# Patient Record
Sex: Male | Born: 1945 | Race: White | Hispanic: No | Marital: Married | Smoking: Former smoker
Health system: Western US, Academic
[De-identification: ages and names within clinical notes are randomized; demographics above are authoritative.]

## PROBLEM LIST (undated history)

## (undated) DIAGNOSIS — H269 Unspecified cataract: Secondary | ICD-10-CM

## (undated) DIAGNOSIS — H18231 Secondary corneal edema, right eye: Secondary | ICD-10-CM

## (undated) DIAGNOSIS — H18891 Other specified disorders of cornea, right eye: Secondary | ICD-10-CM

## (undated) DIAGNOSIS — D869 Sarcoidosis, unspecified: Secondary | ICD-10-CM

## (undated) DIAGNOSIS — H21541 Posterior synechiae (iris), right eye: Secondary | ICD-10-CM

## (undated) DIAGNOSIS — E1065 Type 1 diabetes mellitus with hyperglycemia: Secondary | ICD-10-CM

## (undated) DIAGNOSIS — E1069 Type 1 diabetes mellitus with other specified complication: Secondary | ICD-10-CM

## (undated) DIAGNOSIS — E7800 Pure hypercholesterolemia, unspecified: Secondary | ICD-10-CM

## (undated) DIAGNOSIS — I2510 Atherosclerotic heart disease of native coronary artery without angina pectoris: Secondary | ICD-10-CM

## (undated) DIAGNOSIS — I10 Essential (primary) hypertension: Secondary | ICD-10-CM

## (undated) DIAGNOSIS — H5200 Hypermetropia, unspecified eye: Secondary | ICD-10-CM

## (undated) DIAGNOSIS — G3183 Dementia with Lewy bodies: Secondary | ICD-10-CM

## (undated) DIAGNOSIS — I519 Heart disease, unspecified: Secondary | ICD-10-CM

## (undated) DIAGNOSIS — F0280 Dementia in other diseases classified elsewhere without behavioral disturbance: Secondary | ICD-10-CM

## (undated) DIAGNOSIS — N200 Calculus of kidney: Secondary | ICD-10-CM

## (undated) DIAGNOSIS — E113599 Type 2 diabetes mellitus with proliferative diabetic retinopathy without macular edema, unspecified eye: Secondary | ICD-10-CM

## (undated) DIAGNOSIS — H409 Unspecified glaucoma: Secondary | ICD-10-CM

## (undated) DIAGNOSIS — H348192 Central retinal vein occlusion, unspecified eye, stable: Secondary | ICD-10-CM

## (undated) DIAGNOSIS — Z961 Presence of intraocular lens: Secondary | ICD-10-CM

## (undated) DIAGNOSIS — H1811 Bullous keratopathy, right eye: Secondary | ICD-10-CM

## (undated) DIAGNOSIS — I5020 Unspecified systolic (congestive) heart failure: Secondary | ICD-10-CM

## (undated) DIAGNOSIS — I219 Acute myocardial infarction, unspecified: Secondary | ICD-10-CM

## (undated) DIAGNOSIS — J45909 Unspecified asthma, uncomplicated: Secondary | ICD-10-CM

## (undated) DIAGNOSIS — E119 Type 2 diabetes mellitus without complications: Secondary | ICD-10-CM

## (undated) DIAGNOSIS — E785 Hyperlipidemia, unspecified: Secondary | ICD-10-CM

## (undated) HISTORY — PX: CORONARY STENT INTERVENTION: CATH118234

## (undated) HISTORY — DX: Posterior synechiae (iris), right eye: H21.541

## (undated) HISTORY — DX: Pure hypercholesterolemia, unspecified: E78.00

## (undated) HISTORY — DX: Heart disease, unspecified: I51.9

## (undated) HISTORY — DX: Type 2 diabetes mellitus with proliferative diabetic retinopathy without macular edema, unspecified eye (CMS-HCC): E11.3599

## (undated) HISTORY — DX: Secondary corneal edema, right eye: H18.231

## (undated) HISTORY — DX: Unspecified glaucoma: H40.9

## (undated) HISTORY — DX: Bullous keratopathy, right eye: H18.11

## (undated) HISTORY — DX: Sarcoidosis, unspecified: D86.9

## (undated) HISTORY — DX: Essential (primary) hypertension: I10

## (undated) HISTORY — DX: Hypermetropia, unspecified eye: H52.00

## (undated) HISTORY — PX: CORONARY ARTERY BYPASS GRAFT: SHX141

## (undated) HISTORY — DX: Central retinal vein occlusion, unspecified eye, stable (CMS-HCC): H34.8192

## (undated) HISTORY — PX: SPLENECTOMY, TOTAL: SHX788

## (undated) HISTORY — DX: Calculus of kidney: N20.0

## (undated) HISTORY — PX: KIDNEY STONE SURGERY: SHX686

## (undated) HISTORY — DX: Presence of intraocular lens: Z96.1

## (undated) HISTORY — DX: Atherosclerotic heart disease of native coronary artery without angina pectoris: I25.10

## (undated) HISTORY — DX: Unspecified cataract: H26.9

## (undated) HISTORY — DX: Type 1 diabetes mellitus with hyperglycemia (CMS-HCC): E10.65

## (undated) HISTORY — DX: Other specified disorders of cornea, right eye: H18.891

## (undated) MED ORDER — KETOROLAC TROMETHAMINE 0.5 % OP SOLN
1.00 [drp] | OPHTHALMIC | Status: AC
Start: 2019-03-11 — End: 2019-03-11

## (undated) MED ORDER — TETRACAINE HCL 0.5 % OP SOLN
1.00 [drp] | OPHTHALMIC | Status: AC
Start: 2019-03-11 — End: 2019-03-11

## (undated) MED ORDER — PREDNISOLONE ACETATE 1 % OP SUSP
1.00 [drp] | OPHTHALMIC | Status: AC
Start: 2019-03-11 — End: 2019-03-11

---
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
## 2013-07-07 HISTORY — PX: GLAUCOMA SURGERY: SHX656

## 2014-03-23 ENCOUNTER — Ambulatory Visit: Payer: Self-pay | Admitting: Ophthalmology

## 2014-04-21 ENCOUNTER — Observation Stay: Admission: TF | Admit: 2014-04-21 | Payer: Self-pay | Admitting: Ophthalmology

## 2014-04-21 HISTORY — PX: CATARACT EXTRACTION: SHX1313B

## 2014-04-22 ENCOUNTER — Observation Stay: Admission: TF | Admit: 2014-04-22 | Payer: Self-pay | Admitting: Ophthalmology

## 2014-04-22 ENCOUNTER — Ambulatory Visit: Payer: Self-pay | Admitting: Ophthalmology

## 2014-04-22 HISTORY — PX: PARS PLANA VITRECTOMY: SHX2166

## 2014-04-23 ENCOUNTER — Ambulatory Visit: Payer: Self-pay

## 2014-04-27 ENCOUNTER — Ambulatory Visit: Payer: Self-pay

## 2014-05-04 ENCOUNTER — Ambulatory Visit: Payer: Self-pay | Admitting: Ophthalmology

## 2014-05-12 ENCOUNTER — Observation Stay: Admission: TF | Admit: 2014-05-12 | Payer: Self-pay | Admitting: Ophthalmology

## 2014-05-12 HISTORY — PX: INTRAOCULAR LENS INSERTION: SHX110

## 2014-05-13 ENCOUNTER — Ambulatory Visit: Payer: Self-pay | Admitting: Ophthalmology

## 2014-05-25 ENCOUNTER — Emergency Department (HOSPITAL_BASED_OUTPATIENT_CLINIC_OR_DEPARTMENT_OTHER): Payer: PRIVATE HEALTH INSURANCE

## 2014-05-25 ENCOUNTER — Encounter (HOSPITAL_BASED_OUTPATIENT_CLINIC_OR_DEPARTMENT_OTHER): Payer: Self-pay | Admitting: Emergency Medicine

## 2014-05-25 ENCOUNTER — Emergency Department (HOSPITAL_BASED_OUTPATIENT_CLINIC_OR_DEPARTMENT_OTHER)
Admission: EM | Admit: 2014-05-25 | Discharge: 2014-05-25 | Disposition: A | Discharge status: Home / Self Care | Payer: PRIVATE HEALTH INSURANCE | Attending: Emergency Medicine | Admitting: Emergency Medicine

## 2014-05-25 DIAGNOSIS — J45901 Unspecified asthma with (acute) exacerbation: Secondary | ICD-10-CM | POA: Insufficient documentation

## 2014-05-25 DIAGNOSIS — R0602 Shortness of breath: Secondary | ICD-10-CM | POA: Diagnosis present

## 2014-05-25 HISTORY — DX: Unspecified asthma, uncomplicated: J45.909

## 2014-05-25 MED ORDER — ALBUTEROL (5 MG/ML) CONTINUOUS INHALATION SOLN
15.0000 mg/h | INHALATION_SOLUTION | RESPIRATORY_TRACT | Status: DC
  Administered 2014-05-25: 15 mg/h via RESPIRATORY_TRACT
  Filled 2014-05-25: qty 20

## 2014-05-25 MED ORDER — AZITHROMYCIN 250 MG PO TABS: 250.0000 mg | ORAL_TABLET | Freq: Every day | ORAL | Status: DC

## 2014-05-25 MED ORDER — PREDNISONE 50 MG PO TABS
60.0000 mg | ORAL_TABLET | Freq: Every day | ORAL | Status: DC
  Administered 2014-05-25: 60 mg via ORAL
  Filled 2014-05-25 (×4): qty 1

## 2014-05-25 MED ORDER — IPRATROPIUM-ALBUTEROL 0.5-2.5 (3) MG/3ML IN SOLN
3.0000 mL | Freq: Once | RESPIRATORY_TRACT | Status: AC
  Administered 2014-05-25: 3 mL via RESPIRATORY_TRACT

## 2014-05-25 MED ORDER — PREDNISONE 10 MG PO TABS: 20.0000 mg | ORAL_TABLET | Freq: Every day | ORAL | Status: DC

## 2014-05-25 MED ORDER — PREDNISONE 10 MG PO TABS: ORAL_TABLET | ORAL | Status: DC

## 2014-05-25 MED ORDER — ALBUTEROL SULFATE (2.5 MG/3ML) 0.083% IN NEBU
INHALATION_SOLUTION | RESPIRATORY_TRACT | Status: AC
  Administered 2014-05-25: 2.5 mg via RESPIRATORY_TRACT
  Filled 2014-05-25: qty 3

## 2014-05-25 MED ORDER — AZITHROMYCIN 250 MG PO TABS
500.0000 mg | ORAL_TABLET | Freq: Once | ORAL | Status: AC
  Administered 2014-05-25: 500 mg via ORAL
  Filled 2014-05-25: qty 2

## 2014-05-25 MED ORDER — ALBUTEROL SULFATE (2.5 MG/3ML) 0.083% IN NEBU
2.5000 mg | INHALATION_SOLUTION | Freq: Once | RESPIRATORY_TRACT | Status: AC
  Administered 2014-05-25: 2.5 mg via RESPIRATORY_TRACT

## 2014-05-25 MED ORDER — ALBUTEROL SULFATE (2.5 MG/3ML) 0.083% IN NEBU
5.0000 mg | INHALATION_SOLUTION | Freq: Once | RESPIRATORY_TRACT | Status: AC
Start: 2014-05-25 — End: 2014-05-25
  Administered 2014-05-25: 5 mg via RESPIRATORY_TRACT
  Filled 2014-05-25: qty 6

## 2014-05-25 MED ORDER — IPRATROPIUM-ALBUTEROL 0.5-2.5 (3) MG/3ML IN SOLN
RESPIRATORY_TRACT | Status: AC
  Administered 2014-05-25: 3 mL via RESPIRATORY_TRACT
  Filled 2014-05-25: qty 3

## 2014-05-25 NOTE — ED Provider Notes (Signed)
CSN: [id number]     Arrival date & time 05/25/14  1536 History   First MD Initiated Contact with Patient 05/25/14 1657     Chief Complaint  Patient presents with  . Shortness of Breath     (Consider location/radiation/quality/duration/timing/severity/associated sxs/prior Treatment) Patient is a 68 y.o. male presenting with shortness of breath. The history is provided by the patient. No language interpreter was used.  Shortness of Breath Severity:  Moderate Onset quality:  Gradual Duration:  1 day Timing:  Constant Progression:  Worsening Chronicity:  New Relieved by:  Nothing Worsened by:  Nothing tried Ineffective treatments:  None tried Associated symptoms: cough   Risk factors: no hx of cancer     Past Medical History  Diagnosis Date  . Asthma    History reviewed. No pertinent past surgical history. History reviewed. No pertinent family history. History  Substance Use Topics  . Smoking status: Former Games developermoker  . Smokeless tobacco: Not on file  . Alcohol Use: No    Review of Systems  Respiratory: Positive for cough and shortness of breath.   All other systems reviewed and are negative.     Allergies  Review of patient's allergies indicates no known allergies.  Home Medications   Prior to Admission medications   Not on File   BP 129/89  Pulse 94  Temp(Src) 98.3 F (36.8 C) (Oral)  Resp 20  SpO2 100% Physical Exam  Nursing note and vitals reviewed. Constitutional: He is oriented to person, place, and time. He appears well-developed and well-nourished.  HENT:  Head: Normocephalic and atraumatic.  Right Ear: External ear normal.  Left Ear: External ear normal.  Eyes: Conjunctivae and EOM are normal. Pupils are equal, round, and reactive to light.  Neck: Normal range of motion.  Cardiovascular: Normal rate and normal heart sounds.   Pulmonary/Chest: He has wheezes. He exhibits tenderness.  Abdominal: Soft. He exhibits no distension.  Musculoskeletal:  Normal range of motion.  Neurological: He is alert and oriented to person, place, and time.  Skin: Skin is warm.  Psychiatric: He has a normal mood and affect.    ED Course  Procedures (including critical care time) Labs Review Labs Reviewed - No data to display  Imaging Review Dg Chest 2 View  05/25/2014   CLINICAL DATA:  Acute asthma exacerbation with shortness of breath.  EXAM: CHEST  2 VIEW  COMPARISON:  05/04/2014, 08/04/2007.  FINDINGS: Cardiomediastinal silhouette unremarkable, unchanged. Lungs clear. Bronchovascular markings normal. Pulmonary vascularity normal. No visible pleural effusions. No pneumothorax. Visualized bony thorax intact. No significant interval change.  IMPRESSION: Normal and stable examination.   Electronically Signed   By: Hulan Saashomas  Lawrence M.D.   On: 05/25/2014 16:24     EKG Interpretation None      MDM  Pt given albuterol neb.  Pt feels better.   Pt given 1 hour continous neb.  Reexam.   Pt's lungs are clear.    Final diagnoses:  Asthma attack    Prednisone taper Zithromax Pt has albuterol inhaler See Primary for recheck in 2-3 days    Elson AreasLeslie K Sofia, PA-C 05/25/14 2210

## 2014-05-25 NOTE — Discharge Instructions (Signed)
Asthma °Asthma is a recurring condition in which the airways tighten and narrow. Asthma can make it difficult to breathe. It can cause coughing, wheezing, and shortness of breath. Asthma episodes, also called asthma attacks, range from minor to life-threatening. Asthma cannot be cured, but medicines and lifestyle changes can help control it. °CAUSES °Asthma is believed to be caused by inherited (genetic) and environmental factors, but its exact cause is unknown. Asthma may be triggered by allergens, lung infections, or irritants in the air. Asthma triggers are different for each person. Common triggers include:  °· Animal dander. °· Dust mites. °· Cockroaches. °· Pollen from trees or grass. °· Mold. °· Smoke. °· Air pollutants such as dust, household cleaners, hair sprays, aerosol sprays, paint fumes, strong chemicals, or strong odors. °· Cold air, weather changes, and winds (which increase molds and pollens in the air). °· Strong emotional expressions such as crying or laughing hard. °· Stress. °· Certain medicines (such as aspirin) or types of drugs (such as beta-blockers). °· Sulfites in foods and drinks. Foods and drinks that may contain sulfites include dried fruit, potato chips, and sparkling grape juice. °· Infections or inflammatory conditions such as the flu, a cold, or an inflammation of the nasal membranes (rhinitis). °· Gastroesophageal reflux disease (GERD). °· Exercise or strenuous activity. °SYMPTOMS °Symptoms may occur immediately after asthma is triggered or many hours later. Symptoms include: °· Wheezing. °· Excessive nighttime or early morning coughing. °· Frequent or severe coughing with a common cold. °· Chest tightness. °· Shortness of breath. °DIAGNOSIS  °The diagnosis of asthma is made by a review of your medical history and a physical exam. Tests may also be performed. These may include: °· Lung function studies. These tests show how much air you breathe in and out. °· Allergy  tests. °· Imaging tests such as X-rays. °TREATMENT  °Asthma cannot be cured, but it can usually be controlled. Treatment involves identifying and avoiding your asthma triggers. It also involves medicines. There are 2 classes of medicine used for asthma treatment:  °· Controller medicines. These prevent asthma symptoms from occurring. They are usually taken every day. °· Reliever or rescue medicines. These quickly relieve asthma symptoms. They are used as needed and provide short-term relief. °Your health care provider will help you create an asthma action plan. An asthma action plan is a written plan for managing and treating your asthma attacks. It includes a list of your asthma triggers and how they may be avoided. It also includes information on when medicines should be taken and when their dosage should be changed. An action plan may also involve the use of a device called a peak flow meter. A peak flow meter measures how well the lungs are working. It helps you monitor your condition. °HOME CARE INSTRUCTIONS  °· Take medicines only as directed by your health care provider. Speak with your health care provider if you have questions about how or when to take the medicines. °· Use a peak flow meter as directed by your health care provider. Record and keep track of readings. °· Understand and use the action plan to help minimize or stop an asthma attack without needing to seek medical care. °· Control your home environment in the following ways to help prevent asthma attacks: °¨ Do not smoke. Avoid being exposed to secondhand smoke. °¨ Change your heating and air conditioning filter regularly. °¨ Limit your use of fireplaces and wood stoves. °¨ Get rid of pests (such as roaches and   mice) and their droppings. °¨ Throw away plants if you see mold on them. °¨ Clean your floors and dust regularly. Use unscented cleaning products. °¨ Try to have someone else vacuum for you regularly. Stay out of rooms while they are  being vacuumed and for a short while afterward. If you vacuum, use a dust mask from a hardware store, a double-layered or microfilter vacuum cleaner bag, or a vacuum cleaner with a HEPA filter. °¨ Replace carpet with wood, tile, or vinyl flooring. Carpet can trap dander and dust. °¨ Use allergy-proof pillows, mattress covers, and box spring covers. °¨ Wash bed sheets and blankets every week in hot water and dry them in a dryer. °¨ Use blankets that are made of polyester or cotton. °¨ Clean bathrooms and kitchens with bleach. If possible, have someone repaint the walls in these rooms with mold-resistant paint. Keep out of the rooms that are being cleaned and painted. °¨ Wash hands frequently. °SEEK MEDICAL CARE IF:  °· You have wheezing, shortness of breath, or a cough even if taking medicine to prevent attacks. °· The colored mucus you cough up (sputum) is thicker than usual. °· Your sputum changes from clear or white to yellow, green, gray, or bloody. °· You have any problems that may be related to the medicines you are taking (such as a rash, itching, swelling, or trouble breathing). °· You are using a reliever medicine more than 2-3 times per week. °· Your peak flow is still at 50-79% of your personal best after following your action plan for 1 hour. °· You have a fever. °SEEK IMMEDIATE MEDICAL CARE IF:  °· You seem to be getting worse and are unresponsive to treatment during an asthma attack. °· You are short of breath even at rest. °· You get short of breath when doing very little physical activity. °· You have difficulty eating, drinking, or talking due to asthma symptoms. °· You develop chest pain. °· You develop a fast heartbeat. °· You have a bluish color to your lips or fingernails. °· You are light-headed, dizzy, or faint. °· Your peak flow is less than 50% of your personal best. °MAKE SURE YOU:  °· Understand these instructions. °· Will watch your condition. °· Will get help right away if you are not  doing well or get worse. °Document Released: 08/06/2005 Document Revised: 12/21/2013 Document Reviewed: 03/05/2013 °Exit[employer]® Patient Information ©2015 [employer]. This information is not intended to replace advice given to you by your health care provider. Make sure you discuss any questions you have with your health care provider. °Acute Bronchitis °Bronchitis is inflammation of the airways that extend from the windpipe into the lungs (bronchi). The inflammation often causes mucus to develop. This leads to a cough, which is the most common symptom of bronchitis.  °In acute bronchitis, the condition usually develops suddenly and goes away over time, usually in a couple weeks. Smoking, allergies, and asthma can make bronchitis worse. Repeated episodes of bronchitis may cause further lung problems.  °CAUSES °Acute bronchitis is most often caused by the same virus that causes a cold. The virus can spread from person to person (contagious) through coughing, sneezing, and touching contaminated objects. °SIGNS AND SYMPTOMS  °· Cough.   °· Fever.   °· Coughing up mucus.   °· Body aches.   °· Chest congestion.   °· Chills.   °· Shortness of breath.   °· Sore throat.   °DIAGNOSIS  °Acute bronchitis is usually diagnosed through a physical exam. Your health care provider will also   ask you questions about your medical history. Tests, such as chest X-rays, are sometimes done to rule out other conditions.  °TREATMENT  °Acute bronchitis usually goes away in a couple weeks. Oftentimes, no medical treatment is necessary. Medicines are sometimes given for relief of fever or cough. Antibiotic medicines are usually not needed but may be prescribed in certain situations. In some cases, an inhaler may be recommended to help reduce shortness of breath and control the cough. A cool mist vaporizer may also be used to help thin bronchial secretions and make it easier to clear the chest.  °HOME CARE INSTRUCTIONS °· Get plenty of rest.    °· Drink enough fluids to keep your urine clear or pale yellow (unless you have a medical condition that requires fluid restriction). Increasing fluids may help thin your respiratory secretions (sputum) and reduce chest congestion, and it will prevent dehydration.   °· Take medicines only as directed by your health care provider. °· If you were prescribed an antibiotic medicine, finish it all even if you start to feel better. °· Avoid smoking and secondhand smoke. Exposure to cigarette smoke or irritating chemicals will make bronchitis worse. If you are a smoker, consider using nicotine gum or skin patches to help control withdrawal symptoms. Quitting smoking will help your lungs heal faster.   °· Reduce the chances of another bout of acute bronchitis by washing your hands frequently, avoiding people with cold symptoms, and trying not to touch your hands to your mouth, nose, or eyes.   °· Keep all follow-up visits as directed by your health care provider.   °SEEK MEDICAL CARE IF: °Your symptoms do not improve after 1 week of treatment.  °SEEK IMMEDIATE MEDICAL CARE IF: °· You develop an increased fever or chills.   °· You have chest pain.   °· You have severe shortness of breath. °· You have bloody sputum.   °· You develop dehydration. °· You faint or repeatedly feel like you are going to pass out. °· You develop repeated vomiting. °· You develop a severe headache. °MAKE SURE YOU:  °· Understand these instructions. °· Will watch your condition. °· Will get help right away if you are not doing well or get worse. °Document Released: 09/13/2004 Document Revised: 12/21/2013 Document Reviewed: 01/27/2013 °ExitCare® Patient Information ©2015 [employer]. This information is not intended to replace advice given to you by your health care provider. Make sure you discuss any questions you have with your health care provider. ° °

## 2014-05-25 NOTE — ED Notes (Signed)
Pt c/o SOB x 1 day HX asthma 

## 2014-05-26 NOTE — ED Provider Notes (Signed)
Medical screening examination/treatment/procedure(s) were performed by non-physician practitioner and as supervising physician I was immediately available for consultation/collaboration.  Toy CookeyMegan Tiamarie Furnari, MD 05/26/14 [phone]

## 2014-05-27 ENCOUNTER — Ambulatory Visit: Payer: Self-pay

## 2014-06-17 ENCOUNTER — Ambulatory Visit: Payer: Self-pay | Admitting: Ophthalmology

## 2014-06-17 ENCOUNTER — Ambulatory Visit: Payer: Self-pay

## 2014-09-14 ENCOUNTER — Ambulatory Visit: Payer: Self-pay | Admitting: Ophthalmology

## 2015-03-15 ENCOUNTER — Ambulatory Visit: Payer: Self-pay | Admitting: Ophthalmology

## 2016-03-13 ENCOUNTER — Ambulatory Visit: Payer: Self-pay | Admitting: Ophthalmology

## 2016-06-19 DIAGNOSIS — Z961 Presence of intraocular lens: Secondary | ICD-10-CM | POA: Insufficient documentation

## 2016-10-16 ENCOUNTER — Other Ambulatory Visit: Payer: Self-pay

## 2016-10-16 DIAGNOSIS — H348112 Central retinal vein occlusion, right eye, stable: Secondary | ICD-10-CM | POA: Insufficient documentation

## 2016-10-16 DIAGNOSIS — H401190 Primary open-angle glaucoma, unspecified eye, stage unspecified: Secondary | ICD-10-CM | POA: Insufficient documentation

## 2016-10-16 DIAGNOSIS — H18231 Secondary corneal edema, right eye: Secondary | ICD-10-CM | POA: Insufficient documentation

## 2016-10-16 DIAGNOSIS — H04123 Dry eye syndrome of bilateral lacrimal glands: Secondary | ICD-10-CM | POA: Insufficient documentation

## 2016-10-16 DIAGNOSIS — H2512 Age-related nuclear cataract, left eye: Secondary | ICD-10-CM | POA: Insufficient documentation

## 2016-10-16 DIAGNOSIS — E113599 Type 2 diabetes mellitus with proliferative diabetic retinopathy without macular edema, unspecified eye: Secondary | ICD-10-CM | POA: Insufficient documentation

## 2016-10-16 MED ORDER — CARVEDILOL 12.5 MG OR TABS: 12.50 mg | ORAL_TABLET | Freq: Two times a day (BID) | ORAL | Status: AC

## 2016-10-16 MED ORDER — DORZOLAMIDE HCL 2 % OP SOLN: OPHTHALMIC | Status: AC

## 2016-10-16 MED ORDER — INSULIN LISPRO (HUMAN) 100 UNIT/ML SC SOLN: SUBCUTANEOUS | Status: AC

## 2016-10-16 MED ORDER — TRAVOPROST 0.004 % OP SOLN: 1.00 [drp] | Freq: Every evening | OPHTHALMIC | Status: AC

## 2016-10-16 MED ORDER — SIMVASTATIN 40 MG OR TABS: 40.00 mg | ORAL_TABLET | Freq: Every evening | ORAL | Status: AC

## 2016-10-16 MED ORDER — ASPIRIN 325 MG OR TABS
ORAL_TABLET | ORAL | Status: DC
Start: ? — End: 2018-12-30

## 2016-10-16 MED ORDER — RAMIPRIL 5 MG OR CAPS
5.00 mg | ORAL_CAPSULE | Freq: Every day | ORAL | Status: DC
Start: ? — End: 2019-04-01

## 2016-10-16 MED ORDER — BRIMONIDINE TARTRATE-TIMOLOL 0.2-0.5 % OP SOLN: 1.00 [drp] | Freq: Three times a day (TID) | OPHTHALMIC | Status: AC

## 2016-10-18 ENCOUNTER — Ambulatory Visit: Payer: Medicare Other | Admitting: Ophthalmology

## 2016-10-18 ENCOUNTER — Encounter: Payer: Self-pay | Admitting: Ophthalmology

## 2016-10-18 DIAGNOSIS — H02883 Meibomian gland dysfunction of right eye, unspecified eyelid: Secondary | ICD-10-CM

## 2016-10-18 DIAGNOSIS — H04123 Dry eye syndrome of bilateral lacrimal glands: Secondary | ICD-10-CM

## 2016-10-18 DIAGNOSIS — H2512 Age-related nuclear cataract, left eye: Secondary | ICD-10-CM

## 2016-10-18 DIAGNOSIS — H0289 Other specified disorders of eyelid: Secondary | ICD-10-CM

## 2016-10-18 DIAGNOSIS — H18231 Secondary corneal edema, right eye: Secondary | ICD-10-CM

## 2016-10-18 DIAGNOSIS — Z961 Presence of intraocular lens: Secondary | ICD-10-CM

## 2016-10-18 DIAGNOSIS — H18891 Other specified disorders of cornea, right eye: Secondary | ICD-10-CM

## 2016-10-18 MED ORDER — ASPIRIN 81 MG OR TABS: 81.00 mg | ORAL_TABLET | Freq: Every day | ORAL | Status: AC

## 2016-10-18 MED ORDER — SODIUM CHLORIDE (HYPERTONIC) 5 % OP SOLN: 1.00 [drp] | Freq: Three times a day (TID) | OPHTHALMIC | Status: AC

## 2016-10-18 MED ORDER — SODIUM CHLORIDE (HYPERTONIC) 5 % OP OINT: 1.00 [drp] | TOPICAL_OINTMENT | Freq: Every evening | OPHTHALMIC | Status: AC

## 2016-10-18 NOTE — Progress Notes (Signed)
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 If painful bullae or epi breakdown, would consider surgical intervention.     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 D/w patient my concerns of active NV and recommend he keep appt w/ retina.     Glaucoma   h/o 2 tubes   *continue Combigan, Travatan, Dorzolamide   followed by Dr. Ronita HippsLee     Allison R Jarstad DO   Cornea and Refractive Surgery Fellow         I performed the above HPI. I reviewed and confirmed the techs ROS, past histories, and readings.  I saw and examined the patient, and discussed the case with the resident/fellow.  The final examination findings, image interpretations, and plan as documented in the record represent my personal judgment and conclusions.

## 2016-10-29 ENCOUNTER — Telehealth: Payer: Self-pay | Admitting: Ophthalmology

## 2016-10-29 NOTE — Telephone Encounter (Signed)
John at Dr. Marigene EhlersLee's office requesting the last progress reports for the patient's last visit on 10/18/2016. Please fax to [phone], thank you.

## 2016-10-30 NOTE — Telephone Encounter (Signed)
Office Visit Notes from 10/18/16 faxed to Dr. Nedra HaiLee.

## 2017-03-14 ENCOUNTER — Ambulatory Visit: Payer: Medicare Other | Attending: Ophthalmology | Admitting: Ophthalmology

## 2017-03-14 ENCOUNTER — Ambulatory Visit: Payer: Self-pay | Admitting: Ophthalmology

## 2017-03-14 DIAGNOSIS — H348112 Central retinal vein occlusion, right eye, stable: Secondary | ICD-10-CM | POA: Insufficient documentation

## 2017-03-14 DIAGNOSIS — H401130 Primary open-angle glaucoma, bilateral, stage unspecified: Secondary | ICD-10-CM | POA: Insufficient documentation

## 2017-03-14 DIAGNOSIS — Z961 Presence of intraocular lens: Secondary | ICD-10-CM | POA: Insufficient documentation

## 2017-03-14 DIAGNOSIS — H04123 Dry eye syndrome of bilateral lacrimal glands: Secondary | ICD-10-CM | POA: Insufficient documentation

## 2017-03-14 DIAGNOSIS — H2512 Age-related nuclear cataract, left eye: Principal | ICD-10-CM | POA: Insufficient documentation

## 2017-03-14 MED ORDER — TAMSULOSIN HCL 0.4 MG PO CAPS: 0.40 mg | ORAL_CAPSULE | Freq: Every day | ORAL | Status: AC

## 2017-03-14 NOTE — Progress Notes (Signed)
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 D/w patient my concerns of active NV and recommend he keep appt w/ retina.     Glaucoma   h/o 2 tubes ; T 21 today; recommend repeat IOP check within 1-2 months with glaucoma service; pt to call Dr. Nedra HaiLee to see if appt can be moved up  *continue Combigan, Travatan, Dorzolamide   followed by Dr. Delma OfficerLee     Mina Farahani, MD  Cornea and Refractive Surgery Fellow     I performed the above HPI. I reviewed and confirmed the techs ROS, past histories, and readings.  I saw and examined the patient, and discussed the case with the resident/fellow.  The final examination findings, image interpretations, and plan as documented in the record represent my personal judgment and conclusions.

## 2017-07-23 ENCOUNTER — Emergency Department (HOSPITAL_BASED_OUTPATIENT_CLINIC_OR_DEPARTMENT_OTHER): Payer: Medicare Other

## 2017-07-23 ENCOUNTER — Encounter (HOSPITAL_BASED_OUTPATIENT_CLINIC_OR_DEPARTMENT_OTHER): Payer: Self-pay | Admitting: Emergency Medicine

## 2017-07-23 ENCOUNTER — Other Ambulatory Visit: Payer: Self-pay

## 2017-07-23 ENCOUNTER — Emergency Department (HOSPITAL_BASED_OUTPATIENT_CLINIC_OR_DEPARTMENT_OTHER)
Admission: EM | Admit: 2017-07-23 | Discharge: 2017-07-23 | Disposition: A | Discharge status: Home / Self Care | Payer: Medicare Other | Attending: Emergency Medicine | Admitting: Emergency Medicine

## 2017-07-23 DIAGNOSIS — Y939 Activity, unspecified: Secondary | ICD-10-CM | POA: Diagnosis not present

## 2017-07-23 DIAGNOSIS — Y999 Unspecified external cause status: Secondary | ICD-10-CM | POA: Insufficient documentation

## 2017-07-23 DIAGNOSIS — Z23 Encounter for immunization: Secondary | ICD-10-CM | POA: Insufficient documentation

## 2017-07-23 DIAGNOSIS — S6992XA Unspecified injury of left wrist, hand and finger(s), initial encounter: Secondary | ICD-10-CM

## 2017-07-23 DIAGNOSIS — Z87891 Personal history of nicotine dependence: Secondary | ICD-10-CM | POA: Insufficient documentation

## 2017-07-23 DIAGNOSIS — Y929 Unspecified place or not applicable: Secondary | ICD-10-CM | POA: Insufficient documentation

## 2017-07-23 DIAGNOSIS — W260XXA Contact with knife, initial encounter: Secondary | ICD-10-CM | POA: Insufficient documentation

## 2017-07-23 DIAGNOSIS — S61315A Laceration without foreign body of left ring finger with damage to nail, initial encounter: Secondary | ICD-10-CM | POA: Diagnosis present

## 2017-07-23 LAB — GENERIC EXTERNAL RESULT (UNMAPPED): Hemoglobin A1c: 9.2 % — ABNORMAL HIGH (ref 4–5.9)

## 2017-07-23 MED ORDER — TETANUS-DIPHTH-ACELL PERTUSSIS 5-2.5-18.5 LF-MCG/0.5 IM SUSP
0.5000 mL | Freq: Once | INTRAMUSCULAR | Status: AC
  Administered 2017-07-23: 0.5 mL via INTRAMUSCULAR
  Filled 2017-07-23: qty 0.5

## 2017-07-23 NOTE — Discharge Instructions (Signed)
Keep area clean and dry. You can wash with soap and water tomorrow. Do not scrub. Do not pick at clotted area - let it fall off on its own.  Return if worsening

## 2017-07-23 NOTE — ED Provider Notes (Signed)
[facility] EMERGENCY DEPARTMENT Provider Note   CSN: [id number] Arrival date & time: 07/23/17  2033     History   Chief Complaint Chief Complaint  Patient presents with  . Finger Injury    HPI Cody Small is a 71 y.o.right hand dominant male who presents with a left index finger injury. No significant PMH. He states that he was working with a circular knife when he accidentally cut the tip of his index finger and nail. He was unable to control the bleeding so came to the ED. He is not on blood thinners. Tetanus is not up to date. He is able to move his fingers. No numbness, tingling, weakness.  HPI  Past Medical History:  Diagnosis Date  . Asthma     There are no active problems to display for this patient.   History reviewed. No pertinent surgical history.     Home Medications    Prior to Admission medications   Medication Sig Start Date End Date Taking? Authorizing Provider  azithromycin (ZITHROMAX) 250 MG tablet Take 1 tablet (250 mg total) by mouth daily. Take first 2 tablets together, then 1 every day until finished. 05/25/14   Elson AreasSofia, Cody K, PA-C  predniSONE (DELTASONE) 10 MG tablet 5,4,3,2,1 taper 05/25/14   Elson AreasSofia, Cody K, PA-C    Family History History reviewed. No pertinent family history.  Social History Social History   Tobacco Use  . Smoking status: Former Games developermoker  . Smokeless tobacco: Never Used  Substance Use Topics  . Alcohol use: No  . Drug use: Not on file     Allergies   Patient has no known allergies.   Review of Systems Review of Systems  Musculoskeletal: Negative for arthralgias.  Skin: Positive for wound.  Neurological: Negative for weakness and numbness.     Physical Exam Updated Vital Signs BP (!) 159/68 (BP Location: Right Arm)   Pulse 65   Temp 98.5 F (36.9 C) (Oral)   Resp 18   Ht 5\' 7"  (1.702 m)   Wt 84.8 kg (187 lb)   SpO2 100%   BMI 29.29 kg/m   Physical Exam  Constitutional: He is oriented  to person, place, and time. He appears well-developed and well-nourished. No distress.  HENT:  Head: Normocephalic and atraumatic.  Eyes: Conjunctivae are normal. Pupils are equal, round, and reactive to light. Right eye exhibits no discharge. Left eye exhibits no discharge. No scleral icterus.  Neck: Normal range of motion.  Cardiovascular: Normal rate.  Pulmonary/Chest: Effort normal. No respiratory distress.  Abdominal: He exhibits no distension.  Musculoskeletal:  Skin avulsion over the tip of the left index finger which is actively bleeding  Neurological: He is alert and oriented to person, place, and time.  Skin: Skin is warm and dry.  Psychiatric: He has a normal mood and affect. His behavior is normal.  Nursing note and vitals reviewed.   ED Treatments / Results  Labs (all labs ordered are listed, but only abnormal results are displayed) Labs Reviewed - No data to display  EKG  EKG Interpretation None       Radiology Dg Hand Complete Left  Result Date: 07/23/2017 CLINICAL DATA:  71 year old male with laceration of the index finger. EXAM: LEFT HAND - COMPLETE 3+ VIEW COMPARISON:  None. FINDINGS: There is no acute fracture or dislocation. The bones are mildly osteopenic. There is laceration of the skin of the tip of the index finger. No radiopaque foreign object or soft tissue gas. IMPRESSION:  No acute/ traumatic osseous pathology. Electronically Signed   By: Arash  Radparvar M.D.   On: 07/23/2017 22:10    Procedures PElgie Collardrocedures (including critical care time)  Medications Ordered in ED Medications  Tdap (BOOSTRIX) injection 0.5 mL (0.5 mLs Intramuscular Given 07/23/17 2304)     Initial Impression / Assessment and Plan / ED Course  I have reviewed the triage vital signs and the nursing notes.  Pertinent labs & imaging results that were available during my care of the patient were reviewed by me and considered in my medical decision making (see chart for  details).  71 year old male presents with skin avulsion of the tip of the left index finger. Wound is not amenable to suturing.  Finger was copiously irrigated under tap water. Casandra DoffingWoundseal was applied and bleeding was controlled. Tdap was updated. Patient was observed for 20 minutes after dressing was applied and there was no further bleeding. He was given wound care instructions and discharged. Return precautions given.  Final Clinical Impressions(s) / ED Diagnoses   Final diagnoses:  Injury of finger of left hand, initial encounter    ED Discharge Orders    None       Bethel BornGekas, Brax Walen Marie, PA-C 07/24/17 Mike [id number]    Arby BarrettePfeiffer, Marcy, MD 07/25/17 [id number]

## 2017-07-23 NOTE — ED Notes (Signed)
Pt verbalizes understanding of d/c instructions and denies any further needs at this time. 

## 2017-07-23 NOTE — ED Triage Notes (Signed)
Patient states that he cut his left 1st finger with a circular knife The patient has bleeding controlled. Avulsion noted to the tip of his left 1st finger

## 2017-09-19 ENCOUNTER — Ambulatory Visit: Payer: Medicare Other | Attending: Ophthalmology | Admitting: Ophthalmology

## 2017-09-19 ENCOUNTER — Encounter: Payer: Self-pay | Admitting: Ophthalmology

## 2017-09-19 DIAGNOSIS — H18891 Other specified disorders of cornea, right eye: Secondary | ICD-10-CM | POA: Insufficient documentation

## 2017-09-19 DIAGNOSIS — H2512 Age-related nuclear cataract, left eye: Secondary | ICD-10-CM | POA: Insufficient documentation

## 2017-09-19 DIAGNOSIS — H04123 Dry eye syndrome of bilateral lacrimal glands: Principal | ICD-10-CM | POA: Insufficient documentation

## 2017-09-19 DIAGNOSIS — H18231 Secondary corneal edema, right eye: Secondary | ICD-10-CM | POA: Insufficient documentation

## 2017-09-19 NOTE — Progress Notes (Signed)
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 Cyndie ChimeNguyen)     Glaucoma   h/o 2 tubes ; T 26 today; recommend repeat IOP check within 1-2 months with glaucoma service; pt to call Dr. Nedra HaiLee to see if appt can be moved up  Had tube removal a few months ago- IOP elevated today  *continue Combigan, Travatan, Dorzolamide   followed by Dr. Nedra HaiLee     RTC 6 months or sooner PRN    Incident to Additional Comments by Signing Physician  I performed the above HPI. I reviewed and confirmed the techs review of systems, past histories, and readings.

## 2018-03-18 ENCOUNTER — Ambulatory Visit: Payer: Medicare Other | Admitting: Ophthalmology

## 2018-06-02 IMAGING — DX DG HAND COMPLETE 3+V*L*
3 series · 3 of 3 positions shown · non-contrast
Comparison: None.

CLINICAL DATA: 71-year-old male with laceration of the index
finger.

EXAM:
LEFT HAND - COMPLETE 3+ VIEW

[hand pa]
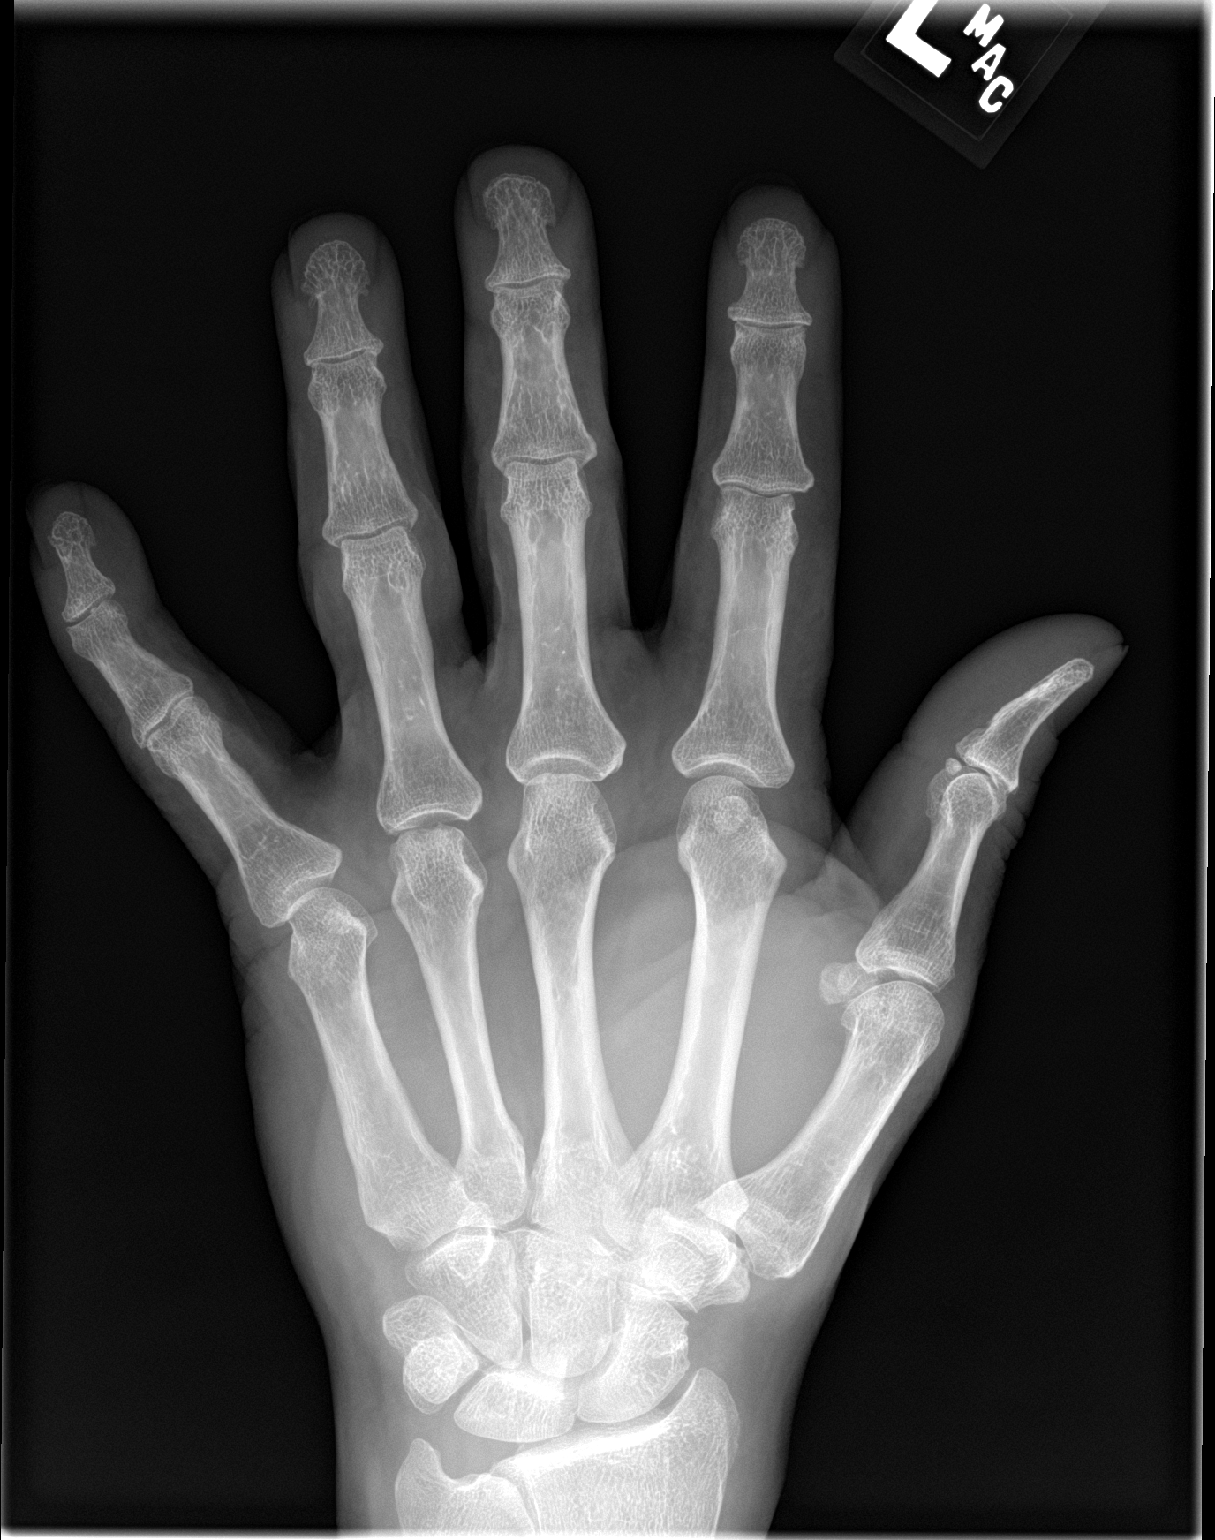

[hand obl]
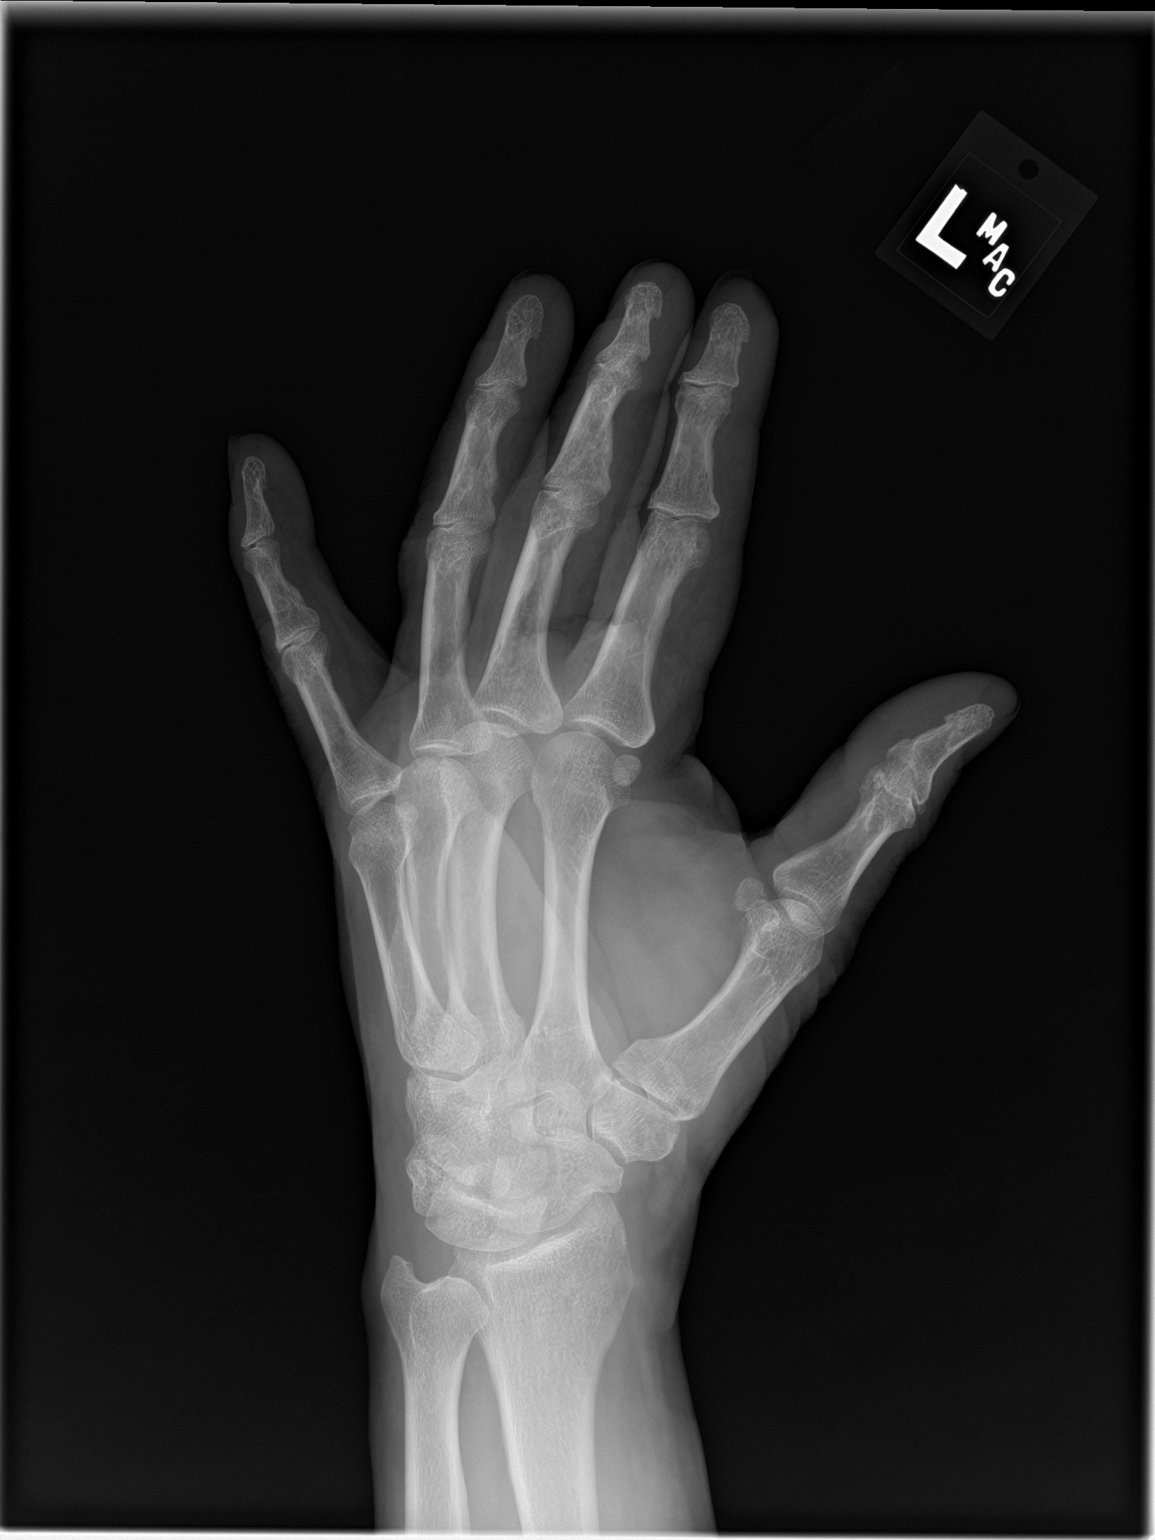

[hand lat]
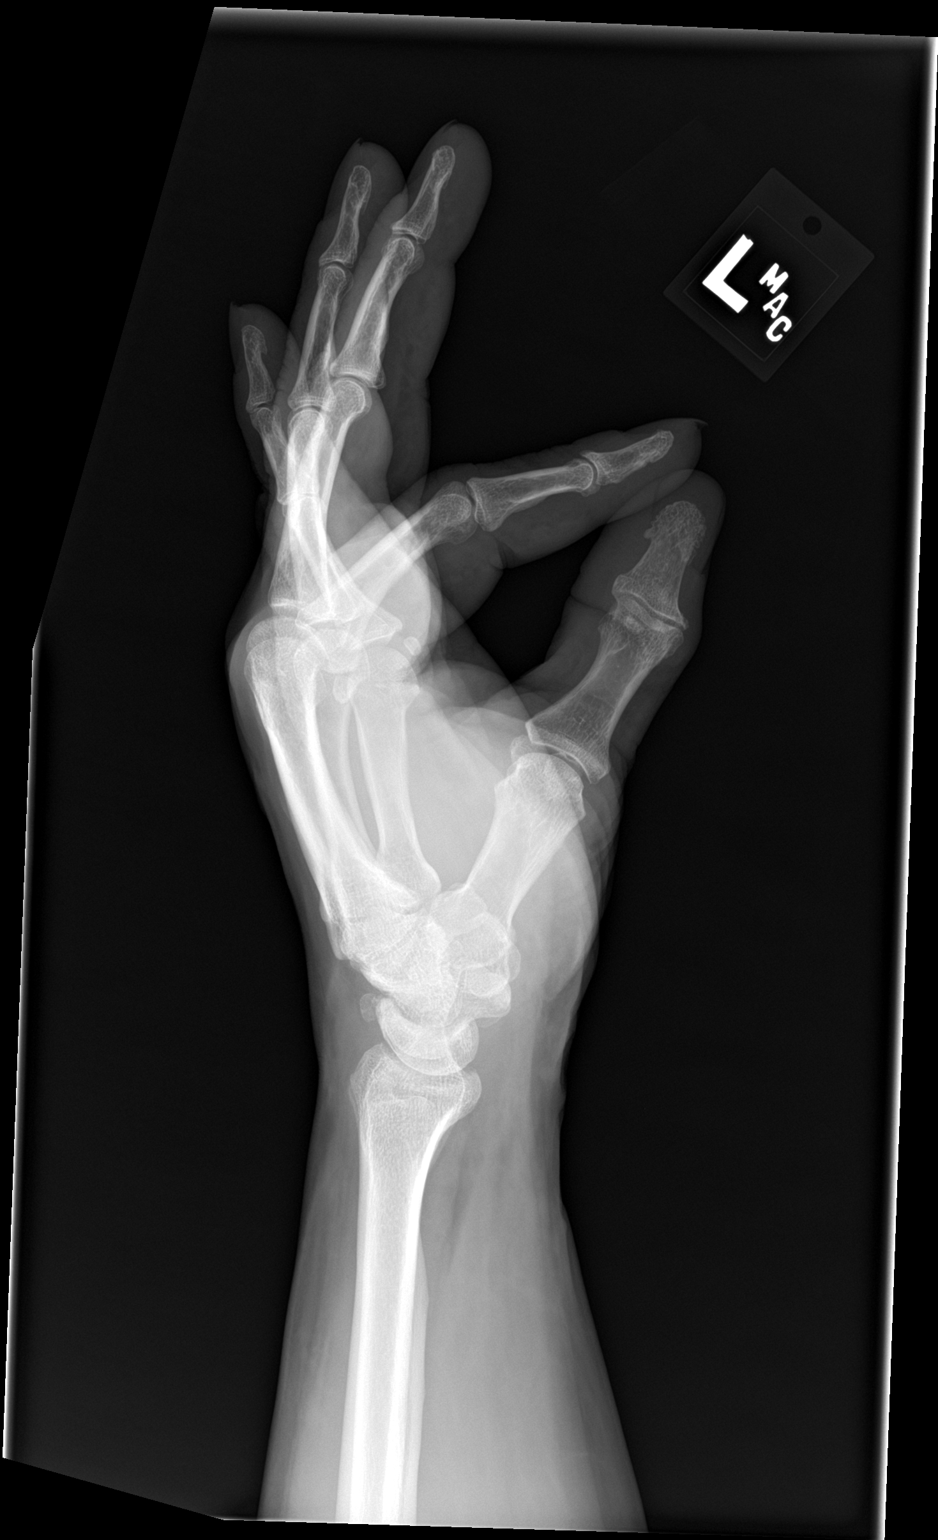

[3 of 3 positions shown; findings below may reference images not displayed]

FINDINGS: There is no acute fracture or dislocation. The bones are mildly
osteopenic. There is laceration of the skin of the tip of the index
finger. No radiopaque foreign object or soft tissue gas.
IMPRESSION: No acute/ traumatic osseous pathology.

## 2018-09-22 ENCOUNTER — Telehealth: Payer: Self-pay | Admitting: Ophthalmology

## 2018-09-22 NOTE — Telephone Encounter (Addendum)
Jessica from Dr.Lee's office called to try and schedule a sooner apt for pt. Informed her that pt did not have any apts pending at the moment. Booked pt for first available at [facility] location for 10/23/18. Please call pt back to accommodate sooner at phn#[phone] ok to leave message.  Thank you.

## 2018-09-30 ENCOUNTER — Ambulatory Visit: Payer: Medicare Other | Admitting: Ophthalmology

## 2018-10-02 ENCOUNTER — Ambulatory Visit: Payer: Medicare Other | Attending: Ophthalmology | Admitting: Ophthalmology

## 2018-10-02 ENCOUNTER — Encounter: Payer: Self-pay | Admitting: Ophthalmology

## 2018-10-02 DIAGNOSIS — H2512 Age-related nuclear cataract, left eye: Secondary | ICD-10-CM | POA: Insufficient documentation

## 2018-10-02 DIAGNOSIS — H1811 Bullous keratopathy, right eye: Principal | ICD-10-CM | POA: Insufficient documentation

## 2018-10-02 NOTE — Progress Notes (Signed)
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   Observe for now    h/o RVO OD with [facility]   poor visual potential from h/o RVO (f/b Dr. Cyndie Chime)     Glaucoma   h/o 2 tubes ; T 26 today; recommend repeat IOP check within 1-2 months with glaucoma service; pt to call Dr. Nedra Hai to see if appt can be moved up  Had tube removal a few months ago- IOP elevated today  *continue Combigan, Travatan, Dorzolamide   followed by Dr. Nedra Hai         I performed the above HPI. I reviewed and confirmed the techs review of systems, past histories, and readings.

## 2018-10-23 ENCOUNTER — Ambulatory Visit: Payer: Medicare Other | Admitting: Ophthalmology

## 2018-12-30 ENCOUNTER — Ambulatory Visit: Payer: Medicare Other | Attending: Ophthalmology | Admitting: Ophthalmology

## 2018-12-30 ENCOUNTER — Encounter: Payer: Self-pay | Admitting: Ophthalmology

## 2018-12-30 DIAGNOSIS — H04123 Dry eye syndrome of bilateral lacrimal glands: Secondary | ICD-10-CM | POA: Insufficient documentation

## 2018-12-30 DIAGNOSIS — H1811 Bullous keratopathy, right eye: Secondary | ICD-10-CM

## 2018-12-30 NOTE — Progress Notes (Signed)
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   Observe for now    h/o RVO OD with [facility]   poor visual potential from h/o RVO (f/b Dr. Cyndie Chime)     Glaucoma   h/o 2 tubes ; T 33 today  Had tube removal a few months ago- IOP elevated today  *continue Combigan, Travatan, Dorzolamide   followed by Dr. Nedra Hai - recommend referral to Dr Nedra Hai within the next week    Follow up in 3 mo        Tacy Dura, MD  Cornea and Refractive Surgery Fellow    I reviewed and confirmed all history components, including the HPI; and any other elements performed by the technician. I performed the key and critical portions of the exam. The final examination findings, image interpretations, and plan as documented in the record represent my personal judgment and conclusions.

## 2019-01-05 LAB — GENERIC EXTERNAL RESULT (UNMAPPED): Hemoglobin A1c: 8.7 % — ABNORMAL HIGH (ref 4–5.9)

## 2019-03-06 ENCOUNTER — Telehealth: Payer: Self-pay | Admitting: Ophthalmology

## 2019-03-06 NOTE — Telephone Encounter (Signed)
CLM @ [phone]  We can schedule appt with Dr. Tonye Royalty Monday in [place] or Tuesday in [place].

## 2019-03-06 NOTE — Telephone Encounter (Signed)
PLEASE TRIAGE

## 2019-03-06 NOTE — Telephone Encounter (Signed)
Spoke to pt wife, deferred Monday appt in [place].   appt 03/10/2019 at [facility]

## 2019-03-06 NOTE — Telephone Encounter (Signed)
Patient's wife calling in requesting an urgent appointment for patient due to him experiencing pressure and also states that the blister opened up and Dr. Truman Hayward advised patient that he needs to see Dr. Truman Hayward ASAP and the glaucoma specialist as well. I called urgent iphone no answer left voice mail.

## 2019-03-10 ENCOUNTER — Ambulatory Visit: Payer: Medicare Other | Attending: Ophthalmology | Admitting: Ophthalmology

## 2019-03-10 ENCOUNTER — Encounter: Payer: Self-pay | Admitting: Ophthalmology

## 2019-03-10 DIAGNOSIS — H04123 Dry eye syndrome of bilateral lacrimal glands: Secondary | ICD-10-CM | POA: Insufficient documentation

## 2019-03-10 DIAGNOSIS — H18231 Secondary corneal edema, right eye: Secondary | ICD-10-CM | POA: Insufficient documentation

## 2019-03-10 DIAGNOSIS — H1811 Bullous keratopathy, right eye: Secondary | ICD-10-CM | POA: Insufficient documentation

## 2019-03-10 DIAGNOSIS — H18891 Other specified disorders of cornea, right eye: Secondary | ICD-10-CM | POA: Insufficient documentation

## 2019-03-10 MED ORDER — MOXIFLOXACIN HCL 0.5 % OP SOLN
1.0000 [drp] | Freq: Four times a day (QID) | OPHTHALMIC | 0 refills | Status: AC
Start: 2019-03-10 — End: ?

## 2019-03-10 NOTE — Progress Notes (Signed)
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  Persistent and worsening corneal edema - with bullae  given poor visual potential - would defer on further surgery    Had burst bullae recently and eye has been painful, no ED on exam today but very rough surface  Add vig QID until next visit      * consider chronic BCTL (pt worried that he will forget to use daily abx gtts)  S/p botox tars 09/2018 - surface is stable--> found helpful but effect was temporary, would like to consider more permanent solution  Will plan to repeat botox tarrs at next visit--> submit for auth today     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   Observe for now    h/o RVO OD with [facility]   poor visual potential from h/o RVO (f/b Dr. Alfonse Spruce)     Glaucoma   h/o 2 tubes ; T 33 today  Had tube removal a few months ago- IOP elevated today  *continue Combigan, Travatan, Dorzolamide   followed by Dr. Truman Hayward   Dr. Truman Hayward would like patient to consider cyclophotocoagulation with [facility] glaucoma service, will have patient see glaucoma service next available, reassess cornea after treatment and consider further intervention      rtc 1 week for botox tars       I reviewed and confirmed all history components, including the HPI; and any other elements performed by the technician.  Incident to Additional Comments by Signing Physician

## 2019-03-11 ENCOUNTER — Encounter: Payer: Self-pay | Admitting: Glaucoma Specialist

## 2019-03-11 ENCOUNTER — Ambulatory Visit: Payer: Medicare Other | Attending: Glaucoma Specialist | Admitting: Glaucoma Specialist

## 2019-03-11 DIAGNOSIS — H2512 Age-related nuclear cataract, left eye: Secondary | ICD-10-CM | POA: Insufficient documentation

## 2019-03-11 DIAGNOSIS — H4051X3 Glaucoma secondary to other eye disorders, right eye, severe stage: Secondary | ICD-10-CM | POA: Insufficient documentation

## 2019-03-11 DIAGNOSIS — H348112 Central retinal vein occlusion, right eye, stable: Secondary | ICD-10-CM | POA: Insufficient documentation

## 2019-03-11 DIAGNOSIS — Z961 Presence of intraocular lens: Secondary | ICD-10-CM

## 2019-03-11 DIAGNOSIS — H1811 Bullous keratopathy, right eye: Secondary | ICD-10-CM

## 2019-03-11 DIAGNOSIS — H401130 Primary open-angle glaucoma, bilateral, stage unspecified: Secondary | ICD-10-CM

## 2019-03-11 DIAGNOSIS — H04123 Dry eye syndrome of bilateral lacrimal glands: Secondary | ICD-10-CM | POA: Insufficient documentation

## 2019-03-11 DIAGNOSIS — H18231 Secondary corneal edema, right eye: Secondary | ICD-10-CM | POA: Insufficient documentation

## 2019-03-11 NOTE — Progress Notes (Signed)
Mixed-mechanism glaucoma; POAG + NVG/CRVO  - Follows with Dr. Sabino Niemann; referred for Diode  - s/p GDD x 2, one removed a few months ago  - Tm 30s  - on MMT 4 classes   - IOP remains above goal   - Low vision potential given RVO and HM/LP vision   - Agree with diode OD  - CPM     Recommend glaucoma surgery dCPC OD, avoid SN tube   Lengthy discussion with patient regarding risks, benefits, and alternatives of surgery. The risks include but not limited to loss of eye, failure of the surgery to control pressure, the need for additional surgeries, bleeing, and infection. Patient verbalizes and understands that glaucoma surgery WILL NOT improve vision, and best case scenario it will preserve the vision the patient has now, but not infrequently the vision may actually worsen after glaucoma surgery, but in most cases this can be corrected with changes in glasses prescription. In addition, patient knows that pressure can be too high, too low, or normal after the surgery depends on how the well the tube works, and if too high or too low, the patient can quickly lose the remaining vision; patient understands these issues and elect to proceed with the surgery.     LSCD w/ secondary K-edema OD  - Follows w/ Farid  - Low vision potential, defers further surgery   - CPM w/ Vig, no epi defect today   - will be getting repeat botox of tars    CRVO OD  - stable  - Follow with Dr. Alfonse Spruce    PCIOL OD  NSC OS  - observe     Route chart to Dr. Truman Hayward    RTC POD1    I reviewed and confirmed all history components, including the HPI; and any other elements performed by the technician. I performed the key and critical portions of the exam. The final examination findings, image interpretations, and plan as documented in the record represent my personal judgment and conclusions.

## 2019-03-12 ENCOUNTER — Encounter: Payer: Self-pay | Admitting: Glaucoma Specialist

## 2019-03-12 DIAGNOSIS — H4050X0 Glaucoma secondary to other eye disorders, unspecified eye, stage unspecified: Secondary | ICD-10-CM

## 2019-03-16 ENCOUNTER — Telehealth: Payer: Self-pay | Admitting: Glaucoma Specialist

## 2019-03-16 NOTE — Telephone Encounter (Signed)
Dr. Alford Highland office was connected to the office for pre op form

## 2019-03-17 ENCOUNTER — Other Ambulatory Visit: Payer: Self-pay

## 2019-03-17 ENCOUNTER — Ambulatory Visit: Payer: Medicare Other | Admitting: Nurse Practitioner

## 2019-03-17 MED ORDER — ANTERIOR BLUE EYE KIT (WAM) (~~LOC~~)
PACK | 0 refills | Status: DC
Start: 2019-03-17 — End: 2019-03-24
  Filled 2019-03-17: qty 1, 1d supply, fill #0

## 2019-03-17 MED ORDER — MOXIFLOXACIN HCL 0.5 % OP SOLN
1.0000 [drp] | Freq: Four times a day (QID) | OPHTHALMIC | 0 refills | Status: DC
Start: 2019-03-17 — End: 2019-03-24
  Filled 2019-03-17: qty 3, 1d supply, fill #0

## 2019-03-17 MED ORDER — PREDNISOLONE ACETATE 1 % OP SUSP
1.0000 [drp] | Freq: Four times a day (QID) | OPHTHALMIC | 0 refills | Status: DC
Start: 2019-03-17 — End: 2019-03-24
  Filled 2019-03-17: qty 5, 1d supply, fill #0

## 2019-03-17 NOTE — Anesthesia Preprocedure Evaluation (Addendum)
ANESTHESIA PRE-OPERATIVE EVALUATION    Patient Information    Name: Nicholas Welch    MRN: [id number]    DOB: Jul 28, 1946    Age: 73 year old    Sex: male  Procedure(s):  P2 DIODE CYCLOPHOTOCOAGULATION RIGHT EYE      Pre-op Vitals:   There were no vitals taken for this visit.        Primary language spoken:  English    ROS/Medical History:       History of Present Illness: 73yo M with neovascular glaucoma of right eye, severe stage; Primary open angle glaucoma of both eyes, unspecified glaucoma stage    CABG x 5- 15 years ago, carvedilol taken yesterday, ASA taken 7 days ago.   DM- has glucose monitor and pump, which wife manages.         General:  concerns about memory (L),  BMI 27.52  1 FOS w/o exertional symptoms (METS > 4)    Donnetta Hail FNP pre-op assessment note 03/16/19 (media):  -able to walk 4 blocks and 2 flights w/o symptoms  -Patient has no active cardiac or pulmonary disease, is able to achieve > 4 METS. Procedure is low risk. RCRI score is 1, which indicated 6% risk for major cardiac event perioperatively. Patient is medically optimized for proposed surgery. Cardiovascular:  CAD/Angina/MI/CABG/Stents (CABG x5), Anti-platelet drugs: Yes, ASA 63m,   no LV failure/CHF,   hypertension, well controlled,    HLD - on statin  HTN - on carvedilol and ramipril  RBBB  CAD s/p CABG x5    Cardiology note 06/27/17 (media):    EKG 03/16/19 (media): SR @ 88, RBBB    Echo 06/11/18 (media): Normal size left ventricle with normal systolic function, EF 512% trace MR and TR, as compared with previous study 03/09/14, there is small echo free space at the right ventricular free wall    Myocardial perfusion study 05/16/18 for CP and DOE (full report in media): No transient ischemic dilation noted. There is homogenous myocardial perfusion in all areas without any areas of reversible ischemia. Imaging demonstrated normal wall motion and thickness with EF 55%.    Echo 07/24/17 (CE):  CONCLUSIONS  The left ventricle is normal in size. The left  ventricular ejection fraction is normal. The ejection fraction estimate is 60-65%.There is mild to moderate  mitral annular calcification. There is trace mitral regurgitation. There is a trace or physiologic amount of tricuspid regurgitation. RVSP 17 mmHg.    EKG 06/27/17 (media): SR @ 87, RBBB   Anesthesia History:  no history of anesthetic complications,  no family history of anesthetic complications,    Past Surgical History:  05/12/2014: INTRAOCULAR LENS INSERTION; Right      Comment:  by Dr.Farid  04/22/2014: PARS PLANA VITRECTOMY; Right      Comment:  PPV/EL/  Lensectomy (removal of residual lens material)                by Dr.Mehta  04/21/2014: CATARACT EXTRACTION; Right      Comment:  w synechiolysis by Dr.Lu  07/07/2013: GLAUCOMA SURGERY; Right      Comment:  ahmed valve   2000: CORONARY ARTERY BYPASS GRAFT  No date: KIDNEY STONE SURGERY  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 Charlean Merl  Insomnia, on seroquel Hematology/Oncology:   hematologic/lymphatic negative      GI/Hepatic:  negative GI/hepatic ROSno GERD,  no liver disease,   Infectious Disease:  negative for infectious disease     Renal:  chronic renal disease (CKD III, followed by Nephrology),   Endocrine/Other:  diabetes (DM1 since 73yo), using insulin, poorly controlled,  no steriod use,  Sarcoidosis      Pregnancy History:   Pediatrics:         Pre Anesthesia Testing (PCC/CPC) notes/comments:    [facility] Test & records reviewed by [facility] Provider.                      Phone call attempted 03/17/2019 10:20 AM with patient, LM with call back  number to [facility].  Labs 01/05/19 in [facility], A1c 8.7, GFR 46, otherwise ok.  Ready Reviewed by Jerilynn Mages.Schlierkamp NP 03/27/2019 2:57 PM                     Physical Exam    Airway:  Inter-inciser distance > 4 cm  Prognanth Able    Mallampati: III  Neck ROM: full  TM distance: > 6 cm  Short thick neck: Yes        Cardiovascular:  - cardiovascular exam normal         Pulmonary:  - pulmonary exam normal           Neuro/Neck/Skeletal/Skin:      Dental:  - normal exam    Abdominal:                         Past Medical History:   Diagnosis Date   . Bullous keratopathy of right eye    . Cataract     OS   . Coronary arteriosclerosis    . CRVO (central retinal vein occlusion)     OD   . DM hyperosmolarity type I (CMS-HCC)    . Glaucoma     OD   . Heart disease    . Hypercholesterolemia    . Hyperopia    . Hypertension    . Incomplete posterior synechiae, right    . Kidney stone    . 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 PDR (proliferative diabetic retinopathy) (CMS-HCC)     OD   . Pseudophakia of right eye    . Sarcoidosis    . Secondary corneal edema of right eye      Past Surgical History:   Procedure Laterality Date   . INTRAOCULAR LENS INSERTION Right 05/12/2014    by Dr.Farid   . PARS PLANA VITRECTOMY Right 04/22/2014    PPV/EL/  Lensectomy (removal of residual lens material) by Dr.Mehta   . CATARACT EXTRACTION Right 04/21/2014    w synechiolysis by Dr.Lu   . GLAUCOMA SURGERY Right 07/07/2013    ahmed valve    . CORONARY ARTERY BYPASS GRAFT     . KIDNEY STONE SURGERY     . SPLENECTOMY, TOTAL       Social History     Tobacco Use   . Smoking status: Former Smoker     Last attempt to quit: 1974     Years since quitting: 46.6   . Smokeless tobacco: Never Used  Substance Use Topics   . Alcohol use: Yes     Comment: rarely   . Drug use: No       Current Outpatient Medications   Medication Sig Dispense Refill   . aspirin 81 MG tablet Take 81 mg by mouth daily.     . brimonidine-timolol (COMBIGAN) 0.2-0.5 % ophthalmic solution Place 1  drop into right eye 3 times daily.       . carvedilol (COREG) 12.5 MG tablet Take 12.5 mg by mouth 2 times daily (with meals).       . dorzolamide (TRUSOPT) 2 % ophthalmic solution dorzolamide 2% ophthalmic solution  1 drop(s) in the right eye 3 times a day   Quantity: 0;  Refills: 0    Active     . insulin lispro (HUMALOG) 100 UNIT/ML injection Inject under the skin.     Marland Kitchen moxifloxacin (VIGAMOX) 0.5 % ophthalmic solution Place 1 drop into right eye 4 times daily. 1 bottle 0   . ramipril (ALTACE) 5 MG capsule Take 5 mg by mouth daily.       . simvastatin (ZOCOR) 40 MG tablet Take 40 mg by mouth every evening.       . sodium chloride (MURO 128) 5 % ophthalmic ointment Place 1 drop into right eye at bedtime.      . sodium chloride (MURO 128) 5 % ophthalmic solution Place 1 drop into right eye 3 times daily.     . tamsulosin (FLOMAX) 0.4 MG capsule Take 0.4 mg by mouth daily.     . travoprost (TRAVATAN Z) 0.004 % ophthalmic solution Place 1 drop into right eye nightly.         No current facility-administered medications for this visit.      No Known Allergies    Labs and Other Data  No results found for: NA, SODIUM, K, CL, BICARB, BUN, CREAT, GLU, [place]  No results found for: AST, ALT, GGT, LDH, ALK, TP, ALB, TBILI, DBILI  No results found for: WBC, RBC, HGB, HCT, MCV, MCHC, RDW, PLT, PLCTEL, MPV, MPVH, SEG, LYMPHS, MONOS, EOS, BASOS  No results found for: INR, PTT  No results found for: ARTPH, ARTPO2, ARTPCO2    Anesthesia Plan:  Risks and Benefits of Anesthesia  I personally examined the patient immediately prior to the anesthetic and reviewed the pertinent medical history, drug and allergy history, laboratory and imaging studies and consultations. I have determined that the patient has had adequate assessment and testing.      I have prescribed the anesthetic plan:         Planned anesthesia method: Monitored Anesthesia Care         ASA 3 (Severe systemic disease)     Potential anesthesia problems identified and risks  including but not limited to the following were discussed with patient and/or patient's representative: Patient declined further  discussion of risks of anesthesia and Injury to brain, heart and other organs    Planned monitoring method: Routine monitoring  Comments: (Patient has no questions regarding plan for anesthesia (including possible conversion to general anesthesia discussed), or risks of anesthesia. )    Informed Consent:  Anesthetic plan and risks discussed with Patient.

## 2019-03-18 ENCOUNTER — Telehealth: Payer: Self-pay | Admitting: Glaucoma Specialist

## 2019-03-18 NOTE — Telephone Encounter (Signed)
Called patient and spoke with spouse. Informed spouse surgery is scheduled next week (03/25/2019) with Dr. Odis Hollingshead. Went over information sheet in surgery folder. Confirmed Covid testing appointment and location.    *Spouse states patient is diabetic and cannot have a late surgery. Requesting first thing in the morning.     Message forwarded to [facility] to further assist.

## 2019-03-19 NOTE — Telephone Encounter (Signed)
LVM ASKING PT TO CALL BACK DUE TO SCHEDULING CONFLICT NEEDS TO BE MOVED TO 04/01/2019. EARLY AM.

## 2019-03-23 ENCOUNTER — Ambulatory Visit: Payer: Medicare Other

## 2019-03-24 ENCOUNTER — Ambulatory Visit: Payer: Medicare Other | Admitting: Ophthalmology

## 2019-03-24 ENCOUNTER — Other Ambulatory Visit: Payer: PRIVATE HEALTH INSURANCE

## 2019-03-24 MED ORDER — PREDNISOLONE ACETATE 1 % OP SUSP
1.0000 [drp] | Freq: Four times a day (QID) | OPHTHALMIC | 0 refills | Status: AC
Start: 2019-03-24 — End: ?
  Filled 2019-03-24: qty 10, 1d supply, fill #0

## 2019-03-24 MED ORDER — MOXIFLOXACIN HCL 0.5 % OP SOLN
1.0000 [drp] | Freq: Four times a day (QID) | OPHTHALMIC | 0 refills | Status: AC
Start: 2019-03-24 — End: ?
  Filled 2019-03-24: qty 3, 1d supply, fill #0

## 2019-03-24 MED ORDER — ANTERIOR BLUE EYE KIT (WAM) (~~LOC~~)
PACK | 0 refills | Status: AC
Start: 2019-03-24 — End: ?
  Filled 2019-03-24: qty 1, 1d supply, fill #0

## 2019-03-25 ENCOUNTER — Telehealth: Payer: Self-pay | Admitting: Glaucoma Specialist

## 2019-03-25 NOTE — Telephone Encounter (Signed)
Called patient. Informed patient surgery is scheduled next week (04/01/2019) with Dr. Odis Hollingshead. Went over information sheet in surgery folder. Confirmed Covid testing appointment and location.  Clarified any questions or concerns. No further questions asked. Patient agreeable with plan and verbalized understanding.  If patient has any questions they will contact their surgery scheduler:Kiara.

## 2019-03-26 ENCOUNTER — Ambulatory Visit: Payer: Medicare Other | Admitting: Glaucoma Specialist

## 2019-03-30 ENCOUNTER — Other Ambulatory Visit: Payer: Self-pay

## 2019-03-31 ENCOUNTER — Ambulatory Visit: Payer: Medicare Other | Attending: Glaucoma Specialist

## 2019-03-31 DIAGNOSIS — Z1159 Encounter for screening for other viral diseases: Secondary | ICD-10-CM | POA: Insufficient documentation

## 2019-03-31 DIAGNOSIS — H4050X0 Glaucoma secondary to other eye disorders, unspecified eye, stage unspecified: Secondary | ICD-10-CM

## 2019-03-31 DIAGNOSIS — Z01812 Encounter for preprocedural laboratory examination: Secondary | ICD-10-CM | POA: Insufficient documentation

## 2019-03-31 NOTE — Interdisciplinary (Signed)
Patient Nicholas Welch denied any COVID 19 symptoms and was tested for COVID 19 via Nasopharyngeal swab to prepare for procedure. Patient tolerated nasal swab well, specimen collected and sent to lab

## 2019-04-01 ENCOUNTER — Ambulatory Visit
Admission: RE | Admit: 2019-04-01 | Discharge: 2019-04-01 | Disposition: A | Discharge status: Home / Self Care | Payer: Medicare Other | Attending: Glaucoma Specialist | Admitting: Glaucoma Specialist

## 2019-04-01 ENCOUNTER — Ambulatory Visit (HOSPITAL_BASED_OUTPATIENT_CLINIC_OR_DEPARTMENT_OTHER): Payer: Medicare Other

## 2019-04-01 ENCOUNTER — Encounter: Admission: RE | Disposition: A | Discharge status: Home Health | Payer: Self-pay | Attending: Glaucoma Specialist

## 2019-04-01 ENCOUNTER — Ambulatory Visit: Payer: Medicare Other

## 2019-04-01 DIAGNOSIS — I10 Essential (primary) hypertension: Secondary | ICD-10-CM | POA: Insufficient documentation

## 2019-04-01 DIAGNOSIS — I129 Hypertensive chronic kidney disease with stage 1 through stage 4 chronic kidney disease, or unspecified chronic kidney disease: Secondary | ICD-10-CM | POA: Insufficient documentation

## 2019-04-01 DIAGNOSIS — Z951 Presence of aortocoronary bypass graft: Secondary | ICD-10-CM | POA: Insufficient documentation

## 2019-04-01 DIAGNOSIS — N183 Chronic kidney disease, stage 3 (moderate): Secondary | ICD-10-CM | POA: Insufficient documentation

## 2019-04-01 DIAGNOSIS — E1069 Type 1 diabetes mellitus with other specified complication: Secondary | ICD-10-CM

## 2019-04-01 DIAGNOSIS — H4051X3 Glaucoma secondary to other eye disorders, right eye, severe stage: Secondary | ICD-10-CM | POA: Insufficient documentation

## 2019-04-01 DIAGNOSIS — Z9114 Patient's other noncompliance with medication regimen: Secondary | ICD-10-CM | POA: Insufficient documentation

## 2019-04-01 DIAGNOSIS — E785 Hyperlipidemia, unspecified: Secondary | ICD-10-CM | POA: Insufficient documentation

## 2019-04-01 DIAGNOSIS — Z794 Long term (current) use of insulin: Secondary | ICD-10-CM | POA: Insufficient documentation

## 2019-04-01 DIAGNOSIS — I2510 Atherosclerotic heart disease of native coronary artery without angina pectoris: Secondary | ICD-10-CM | POA: Insufficient documentation

## 2019-04-01 DIAGNOSIS — H401130 Primary open-angle glaucoma, bilateral, stage unspecified: Secondary | ICD-10-CM

## 2019-04-01 DIAGNOSIS — E1065 Type 1 diabetes mellitus with hyperglycemia: Secondary | ICD-10-CM | POA: Insufficient documentation

## 2019-04-01 DIAGNOSIS — I4510 Unspecified right bundle-branch block: Secondary | ICD-10-CM | POA: Insufficient documentation

## 2019-04-01 DIAGNOSIS — E1022 Type 1 diabetes mellitus with diabetic chronic kidney disease: Secondary | ICD-10-CM | POA: Insufficient documentation

## 2019-04-01 DIAGNOSIS — G4733 Obstructive sleep apnea (adult) (pediatric): Secondary | ICD-10-CM | POA: Insufficient documentation

## 2019-04-01 DIAGNOSIS — D869 Sarcoidosis, unspecified: Secondary | ICD-10-CM | POA: Insufficient documentation

## 2019-04-01 HISTORY — DX: Neurocognitive disorder with Lewy bodies (CMS-HCC): G31.83

## 2019-04-01 HISTORY — DX: Dementia in other diseases classified elsewhere, unspecified severity, without behavioral disturbance, psychotic disturbance, mood disturbance, and anxiety (CMS-HCC): F02.80

## 2019-04-01 LAB — CORONAVIRUS DISEASE 2019 (COVID-19) SCOV
COVID-19 Comment: NOT DETECTED
COVID-19 Result: NOT DETECTED

## 2019-04-01 LAB — GLUCOSE, POINT OF CARE: Glucose, Point of Care: 222 MG/DL — ABNORMAL HIGH (ref 70–125)

## 2019-04-01 SURGERY — PHOTOCOAGULATION, CILIARY BODY
Anesthesia: Monitored Anesthesia Care (MAC) | Site: Eye | Laterality: Right | Wound class: Class I (Clean)

## 2019-04-01 MED ORDER — DONEPEZIL HCL 5 MG OR TABS: 5.00 mg | ORAL_TABLET | Freq: Every evening | ORAL | Status: AC

## 2019-04-01 MED ORDER — TETRACAINE HCL 0.5 % OP SOLN
OPHTHALMIC | Status: AC
Start: 2019-04-01 — End: 2019-04-01
  Filled 2019-04-01: qty 4

## 2019-04-01 MED ORDER — TRYPAN BLUE 0.15 % OP SOLN
OPHTHALMIC | Status: AC
Start: 2019-04-01 — End: 2019-04-01
  Filled 2019-04-01: qty 1

## 2019-04-01 MED ORDER — OXYCODONE HCL 5 MG OR TABS
5.0000 mg | ORAL_TABLET | ORAL | Status: DC | PRN
Start: 2019-04-01 — End: 2019-04-01

## 2019-04-01 MED ORDER — LIDOCAINE HCL (PF) 4 % IJ SOLN
INTRAMUSCULAR | Status: DC | PRN
Start: 2019-04-01 — End: 2019-04-01
  Administered 2019-04-01: 5 mL

## 2019-04-01 MED ORDER — ONDANSETRON HCL 4 MG/2ML IV SOLN
4.0000 mg | Freq: Once | INTRAMUSCULAR | Status: DC | PRN
Start: 2019-04-01 — End: 2019-04-01

## 2019-04-01 MED ORDER — MORPHINE SULFATE 2 MG/ML IJ SOLN
3.0000 mg | INTRAMUSCULAR | Status: DC | PRN
Start: 2019-04-01 — End: 2019-04-01

## 2019-04-01 MED ORDER — PHENYLEPHRINE DILUTION 100 MCG/ML IJ SOLN
INTRAVENOUS | Status: DC | PRN
Start: 2019-04-01 — End: 2019-04-01
  Administered 2019-04-01 (×2): 100 ug via INTRAVENOUS

## 2019-04-01 MED ORDER — PROPOFOL 200 MG/20ML IV EMUL
INTRAVENOUS | Status: AC
Start: 2019-04-01 — End: 2019-04-01
  Filled 2019-04-01: qty 20

## 2019-04-01 MED ORDER — LIDOCAINE HCL (PF) 4 % IJ SOLN
INTRAMUSCULAR | Status: AC
Start: 2019-04-01 — End: 2019-04-01
  Filled 2019-04-01: qty 15

## 2019-04-01 MED ORDER — METHYLPREDNISOLONE SODIUM SUCC 40 MG IJ SOLR CUSTOM
INTRAMUSCULAR | Status: AC
Start: 2019-04-01 — End: 2019-04-01
  Filled 2019-04-01: qty 160

## 2019-04-01 MED ORDER — PROPOFOL 200 MG/20ML IV EMUL
INTRAVENOUS | Status: DC | PRN
Start: 2019-04-01 — End: 2019-04-01
  Administered 2019-04-01: 50 mg via INTRAVENOUS
  Administered 2019-04-01 (×2): 10 mg via INTRAVENOUS

## 2019-04-01 MED ORDER — DEXTROSE 50 % IV SOLN
INTRAVENOUS | Status: AC
Start: 2019-04-01 — End: 2019-04-01
  Filled 2019-04-01: qty 50

## 2019-04-01 MED ORDER — FOLIC ACID 800 MCG OR TABS: 800.00 ug | ORAL_TABLET | Freq: Every day | ORAL | Status: AC

## 2019-04-01 MED ORDER — CEFAZOLIN SODIUM 1 GM IV SOLR
INTRAVENOUS | Status: DC | PRN
Start: 2019-04-01 — End: 2019-04-01
  Administered 2019-04-01: 50 mg

## 2019-04-01 MED ORDER — QUETIAPINE FUMARATE 25 MG OR TABS: 25.00 mg | ORAL_TABLET | Freq: Two times a day (BID) | ORAL | Status: AC

## 2019-04-01 MED ORDER — METHYLPREDNISOLONE SOD SUCCINATE OPHTHALMIC INJ 20 MG/ML (~~LOC~~)
SUBCONJUNCTIVAL | Status: DC | PRN
Start: 2019-04-01 — End: 2019-04-01
  Administered 2019-04-01 (×2): 0.5 mL via SUBCONJUNCTIVAL

## 2019-04-01 MED ORDER — INDOCYANINE GREEN 25 MG IV SOLR
INTRAVENOUS | Status: AC
Start: 2019-04-01 — End: 2019-04-01
  Filled 2019-04-01: qty 50

## 2019-04-01 MED ORDER — NEOMYCIN-POLYMYXIN-DEXAMETH 0.1 % OP OINT
TOPICAL_OINTMENT | OPHTHALMIC | Status: AC
Start: 2019-04-01 — End: 2019-04-01
  Filled 2019-04-01: qty 10.5

## 2019-04-01 MED ORDER — FENTANYL CITRATE (PF) 100 MCG/2ML IJ SOLN
INTRAMUSCULAR | Status: AC
Start: 2019-04-01 — End: 2019-04-01
  Filled 2019-04-01: qty 2

## 2019-04-01 MED ORDER — FINASTERIDE 5 MG OR TABS: 5.00 mg | ORAL_TABLET | Freq: Every day | ORAL | Status: AC

## 2019-04-01 MED ORDER — TRIAMCINOLONE ACETONIDE 40 MG/ML IJ SUSP
INTRAMUSCULAR | Status: AC
Start: 2019-04-01 — End: 2019-04-01
  Filled 2019-04-01: qty 2

## 2019-04-01 MED ORDER — LIDOCAINE HCL 1 % IJ SOLN
INTRAMUSCULAR | Status: AC
Start: 2019-04-01 — End: 2019-04-01
  Filled 2019-04-01: qty 20

## 2019-04-01 MED ORDER — BUPIVACAINE HCL (PF) 0.75 % IJ SOLN
INTRAMUSCULAR | Status: AC
Start: 2019-04-01 — End: 2019-04-01
  Filled 2019-04-01: qty 90

## 2019-04-01 MED ORDER — BSS IO SOLN
INTRAOCULAR | Status: AC
Start: 2019-04-01 — End: 2019-04-01
  Filled 2019-04-01: qty 120

## 2019-04-01 MED ORDER — KETOROLAC TROMETHAMINE 0.5 % OP SOLN
1.0000 [drp] | OPHTHALMIC | Status: DC
Start: 2019-04-01 — End: 2019-04-01

## 2019-04-01 MED ORDER — SODIUM CHLORIDE 0.9 % IJ SOLN (CUSTOM)
INTRAMUSCULAR | Status: AC
Start: 2019-04-01 — End: 2019-04-01
  Filled 2019-04-01: qty 30

## 2019-04-01 MED ORDER — LOSARTAN POTASSIUM 100 MG OR TABS: 100.00 mg | ORAL_TABLET | Freq: Every day | ORAL | Status: AC

## 2019-04-01 MED ORDER — TETRACAINE HCL 0.5 % OP SOLN
OPHTHALMIC | Status: DC | PRN
Start: 2019-04-01 — End: 2019-04-01
  Administered 2019-04-01 (×2): 2 [drp] via TOPICAL

## 2019-04-01 MED ORDER — LIDOCAINE HCL (PF) 1 % IJ SOLN
INTRAMUSCULAR | Status: AC
Start: 2019-04-01 — End: 2019-04-01
  Filled 2019-04-01: qty 30

## 2019-04-01 MED ORDER — HYDROCODONE-ACETAMINOPHEN 5-325 MG OR TABS
1.0000 | ORAL_TABLET | ORAL | Status: DC | PRN
Start: 2019-04-01 — End: 2019-04-01

## 2019-04-01 MED ORDER — MORPHINE SULFATE 2 MG/ML IJ SOLN
2.0000 mg | INTRAMUSCULAR | Status: DC | PRN
Start: 2019-04-01 — End: 2019-04-01

## 2019-04-01 MED ORDER — SODIUM CHLORIDE 0.9 % IV SOLN
500.0000 mL | INTRAVENOUS | Status: DC
Start: 2019-04-01 — End: 2019-04-01

## 2019-04-01 MED ORDER — ERYTHROMYCIN 5 MG/GM OP OINT
TOPICAL_OINTMENT | OPHTHALMIC | Status: AC
Start: 2019-04-01 — End: 2019-04-01
  Filled 2019-04-01: qty 3.5

## 2019-04-01 MED ORDER — BUPIVACAINE HCL (PF) 0.75 % IJ SOLN
INTRAMUSCULAR | Status: DC | PRN
Start: 2019-04-01 — End: 2019-04-01
  Administered 2019-04-01 (×2): 5 mL

## 2019-04-01 MED ORDER — POVIDONE-IODINE 5 % OP SOLN
OPHTHALMIC | Status: AC
Start: 2019-04-01 — End: 2019-04-01
  Filled 2019-04-01: qty 30

## 2019-04-01 MED ORDER — SODIUM CHLORIDE 0.9 % IV SOLN
INTRAVENOUS | Status: DC
Start: 2019-04-01 — End: 2019-04-01
  Administered 2019-04-01: 07:00:00 via INTRAVENOUS

## 2019-04-01 MED ORDER — TETRACAINE HCL 0.5 % OP SOLN
1.0000 [drp] | OPHTHALMIC | Status: AC
Start: 2019-04-01 — End: 2019-04-01
  Administered 2019-04-01 (×4): 1 [drp] via OPHTHALMIC

## 2019-04-01 MED ORDER — MIDAZOLAM HCL (PF) 2 MG/2ML IJ SOLN
INTRAMUSCULAR | Status: AC
Start: 2019-04-01 — End: 2019-04-01
  Filled 2019-04-01: qty 2

## 2019-04-01 MED ORDER — SODIUM CHLORIDE 0.9 % IV SOLN
12.5000 mg | Freq: Once | INTRAVENOUS | Status: DC | PRN
Start: 2019-04-01 — End: 2019-04-01

## 2019-04-01 MED ORDER — OXYCODONE HCL 5 MG OR TABS
10.0000 mg | ORAL_TABLET | ORAL | Status: DC | PRN
Start: 2019-04-01 — End: 2019-04-01

## 2019-04-01 MED ORDER — PREDNISOLONE ACETATE 1 % OP SUSP
1.0000 [drp] | OPHTHALMIC | Status: AC
Start: 2019-04-01 — End: 2019-04-01
  Administered 2019-04-01 (×4): 1 [drp] via OPHTHALMIC

## 2019-04-01 MED ORDER — FENTANYL CITRATE (PF) 100 MCG/2ML IJ SOLN
INTRAMUSCULAR | Status: DC | PRN
Start: 2019-04-01 — End: 2019-04-01
  Administered 2019-04-01 (×2): 25 ug via INTRAVENOUS

## 2019-04-01 MED ORDER — EPINEPHRINE HCL 1 MG/ML IJ SOLN (CUSTOM)
INTRAMUSCULAR | Status: AC
Start: 2019-04-01 — End: 2019-04-01
  Filled 2019-04-01: qty 1

## 2019-04-01 MED ORDER — CEFAZOLIN SODIUM 1 GM IJ SOLR
INTRAMUSCULAR | Status: AC
Start: 2019-04-01 — End: 2019-04-01
  Filled 2019-04-01: qty 4000

## 2019-04-01 MED ORDER — ERYTHROMYCIN 5 MG/GM OP OINT
TOPICAL_OINTMENT | OPHTHALMIC | Status: DC | PRN
Start: 2019-04-01 — End: 2019-04-01
  Administered 2019-04-01: 1 via OPHTHALMIC

## 2019-04-01 MED ORDER — ATROPINE SULFATE 1 % OP SOLN
OPHTHALMIC | Status: AC
Start: 2019-04-01 — End: 2019-04-01
  Filled 2019-04-01: qty 10

## 2019-04-01 SURGICAL SUPPLY — 3 items
APPLICATOR COTTON TIP 6" STER (Misc Surgical Supply) ×3 IMPLANT
SPEARS SURGICAL (Disp Instruments) ×3 IMPLANT
TOWELS, STERILE BLUE (Drapes/towels) ×3 IMPLANT

## 2019-04-01 NOTE — Discharge Instructions (Signed)
Monitored Anesthesia Care, Care After  These instructions provide you with information about caring for yourself after your procedure. Your health care provider may also give you more specific instructions. Your treatment has been planned according to current medical practices, but problems sometimes occur. Call your health care provider if you have any problems or questions after your procedure.  What can I expect after the procedure?  After your procedure, you may:  Feel sleepy for several hours.  Feel clumsy and have poor balance for several hours.  Feel forgetful about what happened after the procedure.  Have poor judgment for several hours.  Feel nauseous or vomit.  Have a sore throat if you had a breathing tube during the procedure.  Follow these instructions at home:  For at least 24 hours after the procedure:         Have a responsible adult stay with you. It is important to have someone help care for you until you are awake and alert.  Rest as needed.  Do not:  Participate in activities in which you could fall or become injured.  Drive.  Use heavy machinery.  Drink alcohol.  Take sleeping pills or medicines that cause drowsiness.  Make important decisions or sign legal documents.  Take care of children on your own.  Eating and drinking  Follow the diet that is recommended by your health care provider.  If you vomit, drink water, juice, or soup when you can drink without vomiting.  Make sure you have little or no nausea before eating solid foods.  General instructions  Take over-the-counter and prescription medicines only as told by your health care provider.  If you have sleep apnea, surgery and certain medicines can increase your risk for breathing problems. Follow instructions from your health care provider about wearing your sleep device:  Anytime you are sleeping, including during daytime naps.  While taking prescription pain medicines, sleeping medicines, or medicines that make you drowsy.  If you  smoke, do not smoke without supervision.  Keep all follow-up visits as told by your health care provider. This is important.  Contact a health care provider if:  You keep feeling nauseous or you keep vomiting.  You feel light-headed.  You develop a rash.  You have a fever.  Get help right away if:  You have trouble breathing.  Summary  For several hours after your procedure, you may feel sleepy and have poor judgment.  Have a responsible adult stay with you for at least 24 hours or until you are awake and alert.  This information is not intended to replace advice given to you by your health care provider. Make sure you discuss any questions you have with your health care provider.  Document Released: 11/27/2015 Document Revised: 03/22/2017 Document Reviewed: 11/27/2015  Elsevier Interactive Patient Education  2019 [employer]

## 2019-04-01 NOTE — Plan of Care (Signed)
Problem: Promotion of Perioperative Health and Safety  Goal: Promotion of Health and Safety of the Perioperative Patient  Description: The patient remains safe, receives treatment appropriate to the surgical intervention and patient's physiological needs and is discharged or transferred to the appropriate level of care.  Outcome: Progressing

## 2019-04-01 NOTE — Op Note (Addendum)
Date of Operation:  04/01/19    Preoperative Diagnosis(es):  Neovascular glaucoma of right eye, severe stage [H40.51X3]  Primary open angle glaucoma of both eyes, unspecified glaucoma stage [H40.1130]    Postoperative Diagnosis(es): Neovascular glaucoma of right eye, severe stage [H40.51X3]  Primary open angle glaucoma of both eyes, unspecified glaucoma stage [H40.1130]    Operation Performed:  Diode Cyclophotocoagulation, right eye     Surgeon(s): Laurina Bustle, MD (A)    Assistant: Virgia Land, MD (R)    Anesthesia:  Monitored Anesthesia Care (MAC) , Retrobulbar Block     IV Fluids:  None.    Drains:  None.    Complications:  None.    EBL: none    Specimens: none    Counts:  Sponge, needle, and instrument count correct at the end the case.    Indications:  Nicholas Welch is an 73 year old male with a history of Neovascular glaucoma of right eye, severe stage [H40.51X3]  Primary open angle glaucoma of both eyes, unspecified glaucoma stage [H40.1130].  Despite maximum topical medical treatment, the pressure remained uncontrolled.  The risks, benefits, and alternatives of the procedure were discussed with the patient in clinic.  Informed consent was obtained at that time.    Procedure in Detail:  The patient was identified in the preoperative holding area.  The Right eye was marked as the operative eye.  The patient was brought into the operating room. A retrobulbar block consisting of 50:50 0.75% bupivicaine/ 4% lidocaine without epinephrine was given. The transcleral diode cyclophotocoagulation machine was set to a power of 4000 mw, duration 2000 ms. An eye speculum was placed. The handpiece was used to apply energy at 12 spots on the perilimbal scleral, spaced in an even manner. At the end of the procedure the eye speculum was removed. A patch was placed over the eye. The patient was brought back to the PACU in stable condition.      Disposition:  The patient is to be discharged home when tolerating  fluids.    Postoperative Plan:  The patient is to return to clinic tomorrow for postoperative day #1 examination. The patient is to keep the patch on overnight. They are to use Tylenol or Advil for pain and to replace the shield at night time while sleeping.    No qualified resident was available to assist due to the complexity of this case.

## 2019-04-01 NOTE — Anesthesia Postprocedure Evaluation (Signed)
Anesthesia Post Note    Patient: Nicholas Welch    Procedure(s) Performed: Procedure(s):  P2 DIODE CYCLOPHOTOCOAGULATION RIGHT EYE      Final anesthesia type: Monitored Anesthesia Care    Patient location: PACU    Post anesthesia pain: adequate analgesia    Mental status: awake, alert  and oriented    Airway Patent: Yes    Last Vitals:    Vitals Value Taken Time   BP 113/61 04/01/19 0750   Temp 36.2 C 04/01/19 0750   Pulse 68 04/01/19 0750   Resp 16 04/01/19 0750   SpO2 98 % 04/01/19 0750        Temperature >35.5: Yes    Post vital signs: stable    Hydration: adequate    N/V:no    Plan of care per primary team.

## 2019-04-01 NOTE — H&P (Signed)
HISTORY & PHYSICAL - INTERVAL ASSESSMENT & UPDATE     Nikesh Teschner       MRN: [id number]      An interval assessment is required for History & Physical completed less than 30 days prior to the admission or surgery. A History & Physical completed more than 30 days prior to the admission or surgery must be repeated (for same day admit or outpatient procedures).  This is not required for inpatients that have a History and Physical, consult note or daily progress note containing a complete examination, diagnosis and reason for surgery, and patient's condition.    I have reviewed the current history and physical (which is less than 72 days old) and verified its content. I have reexamined the patient today. There are no significant changes to medical status, history or physical examinations in the 24 hours prior to the procedure. Pre-operative antibiotics are not indicated. Patient is undergoing a procedure in which antibiotics are not indicated.      Mauro Kaufmann, MD        04/01/2019 7:08 AM

## 2019-04-02 ENCOUNTER — Encounter: Payer: Self-pay | Admitting: Glaucoma Specialist

## 2019-04-02 ENCOUNTER — Ambulatory Visit: Payer: Medicare Other | Attending: Glaucoma Specialist | Admitting: Glaucoma Specialist

## 2019-04-02 DIAGNOSIS — H4051X3 Glaucoma secondary to other eye disorders, right eye, severe stage: Secondary | ICD-10-CM

## 2019-04-02 DIAGNOSIS — H1811 Bullous keratopathy, right eye: Secondary | ICD-10-CM | POA: Insufficient documentation

## 2019-04-02 DIAGNOSIS — H18231 Secondary corneal edema, right eye: Secondary | ICD-10-CM | POA: Insufficient documentation

## 2019-04-02 DIAGNOSIS — H348112 Central retinal vein occlusion, right eye, stable: Secondary | ICD-10-CM | POA: Insufficient documentation

## 2019-04-02 NOTE — Progress Notes (Signed)
Mixed-mechanism glaucoma; POAG + NVG/CRVO  - Follows with Dr. 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 Alfonse Spruce    PCIOL OD  NSC OS  - observe     RTC 1wk     I reviewed and confirmed all history components, including the HPI; and any other elements performed by the technician. I performed the key and critical portions of the exam. The final examination findings, image interpretations, and plan as documented in the record represent my personal judgment and conclusions.

## 2019-04-03 ENCOUNTER — Telehealth: Payer: Self-pay | Admitting: Glaucoma Specialist

## 2019-04-03 NOTE — Telephone Encounter (Addendum)
Patients neurologist called to request covid test results to be faxed to [facility] where he will be admitted to tonight. Fax [phone]

## 2019-04-03 NOTE — Telephone Encounter (Signed)
Faxed over COVID results.

## 2019-04-07 ENCOUNTER — Ambulatory Visit: Payer: Medicare Other | Admitting: Ophthalmology

## 2019-04-08 ENCOUNTER — Ambulatory Visit: Payer: Medicare Other | Admitting: Glaucoma Specialist

## 2019-08-21 HISTORY — PX: COLONOSCOPY: SHX174

## 2022-07-26 ENCOUNTER — Inpatient Hospital Stay (HOSPITAL_BASED_OUTPATIENT_CLINIC_OR_DEPARTMENT_OTHER)
Admission: EM | Admit: 2022-07-26 | Discharge: 2022-08-01 | DRG: 286 | Disposition: A | Discharge status: Home / Self Care | Payer: Medicare HMO | Attending: Internal Medicine | Admitting: Internal Medicine

## 2022-07-26 ENCOUNTER — Emergency Department (HOSPITAL_BASED_OUTPATIENT_CLINIC_OR_DEPARTMENT_OTHER): Payer: Medicare HMO

## 2022-07-26 ENCOUNTER — Encounter (HOSPITAL_BASED_OUTPATIENT_CLINIC_OR_DEPARTMENT_OTHER): Payer: Self-pay | Admitting: Urology

## 2022-07-26 ENCOUNTER — Other Ambulatory Visit: Payer: Self-pay

## 2022-07-26 ENCOUNTER — Encounter (HOSPITAL_COMMUNITY): Payer: Self-pay

## 2022-07-26 DIAGNOSIS — Z841 Family history of disorders of kidney and ureter: Secondary | ICD-10-CM

## 2022-07-26 DIAGNOSIS — I2582 Chronic total occlusion of coronary artery: Secondary | ICD-10-CM | POA: Diagnosis present

## 2022-07-26 DIAGNOSIS — Z87891 Personal history of nicotine dependence: Secondary | ICD-10-CM

## 2022-07-26 DIAGNOSIS — Z8673 Personal history of transient ischemic attack (TIA), and cerebral infarction without residual deficits: Secondary | ICD-10-CM

## 2022-07-26 DIAGNOSIS — N1832 Chronic kidney disease, stage 3b: Secondary | ICD-10-CM | POA: Diagnosis present

## 2022-07-26 DIAGNOSIS — R54 Age-related physical debility: Secondary | ICD-10-CM | POA: Diagnosis present

## 2022-07-26 DIAGNOSIS — E119 Type 2 diabetes mellitus without complications: Secondary | ICD-10-CM

## 2022-07-26 DIAGNOSIS — I5033 Acute on chronic diastolic (congestive) heart failure: Secondary | ICD-10-CM | POA: Diagnosis present

## 2022-07-26 DIAGNOSIS — Z800 Family history of malignant neoplasm of digestive organs: Secondary | ICD-10-CM

## 2022-07-26 DIAGNOSIS — E785 Hyperlipidemia, unspecified: Secondary | ICD-10-CM | POA: Diagnosis present

## 2022-07-26 DIAGNOSIS — J45909 Unspecified asthma, uncomplicated: Secondary | ICD-10-CM | POA: Diagnosis present

## 2022-07-26 DIAGNOSIS — I509 Heart failure, unspecified: Secondary | ICD-10-CM

## 2022-07-26 DIAGNOSIS — U071 COVID-19: Secondary | ICD-10-CM | POA: Diagnosis not present

## 2022-07-26 DIAGNOSIS — Z1152 Encounter for screening for COVID-19: Secondary | ICD-10-CM

## 2022-07-26 DIAGNOSIS — I130 Hypertensive heart and chronic kidney disease with heart failure and stage 1 through stage 4 chronic kidney disease, or unspecified chronic kidney disease: Principal | ICD-10-CM | POA: Diagnosis present

## 2022-07-26 DIAGNOSIS — Z7951 Long term (current) use of inhaled steroids: Secondary | ICD-10-CM

## 2022-07-26 DIAGNOSIS — Z79899 Other long term (current) drug therapy: Secondary | ICD-10-CM

## 2022-07-26 DIAGNOSIS — I5023 Acute on chronic systolic (congestive) heart failure: Secondary | ICD-10-CM | POA: Diagnosis present

## 2022-07-26 DIAGNOSIS — Z955 Presence of coronary angioplasty implant and graft: Secondary | ICD-10-CM

## 2022-07-26 DIAGNOSIS — I2510 Atherosclerotic heart disease of native coronary artery without angina pectoris: Secondary | ICD-10-CM | POA: Diagnosis present

## 2022-07-26 DIAGNOSIS — Z7982 Long term (current) use of aspirin: Secondary | ICD-10-CM

## 2022-07-26 DIAGNOSIS — I272 Pulmonary hypertension, unspecified: Secondary | ICD-10-CM | POA: Diagnosis present

## 2022-07-26 DIAGNOSIS — I252 Old myocardial infarction: Secondary | ICD-10-CM

## 2022-07-26 DIAGNOSIS — E1122 Type 2 diabetes mellitus with diabetic chronic kidney disease: Secondary | ICD-10-CM | POA: Diagnosis present

## 2022-07-26 HISTORY — DX: Type 2 diabetes mellitus without complications: E11.9

## 2022-07-26 HISTORY — DX: Unspecified systolic (congestive) heart failure: I50.20

## 2022-07-26 HISTORY — DX: Essential (primary) hypertension: I10

## 2022-07-26 HISTORY — DX: Acute myocardial infarction, unspecified: I21.9

## 2022-07-26 HISTORY — DX: Hyperlipidemia, unspecified: E78.5

## 2022-07-26 LAB — BASIC METABOLIC PANEL
Anion gap: 9 (ref 5–15)
BUN: 24 mg/dL — ABNORMAL HIGH (ref 8–23)
CO2: 18 mmol/L — ABNORMAL LOW (ref 22–32)
Calcium: 8.7 mg/dL — ABNORMAL LOW (ref 8.9–10.3)
Chloride: 109 mmol/L (ref 98–111)
Creatinine, Ser: 1.81 mg/dL — ABNORMAL HIGH (ref 0.61–1.24)
GFR, Estimated: 39 mL/min — ABNORMAL LOW (ref >60)
Glucose, Bld: 109 mg/dL — ABNORMAL HIGH (ref 70–99)
Potassium: 3.5 mmol/L (ref 3.5–5.1)
Sodium: 136 mmol/L (ref 135–145)

## 2022-07-26 LAB — CBC WITH DIFFERENTIAL/PLATELET
Abs Immature Granulocytes: 0.03 10*3/uL (ref 0.00–0.07)
Basophils Absolute: 0.1 10*3/uL (ref 0.0–0.1)
Basophils Relative: 1 %
Eosinophils Absolute: 0.4 10*3/uL (ref 0.0–0.5)
Eosinophils Relative: 5 %
HCT: 32.4 % — ABNORMAL LOW (ref 39.0–52.0)
Hemoglobin: 10.9 g/dL — ABNORMAL LOW (ref 13.0–17.0)
Immature Granulocytes: 0 %
Lymphocytes Relative: 23 %
Lymphs Abs: 1.8 10*3/uL (ref 0.7–4.0)
MCH: 28.8 pg (ref 26.0–34.0)
MCHC: 33.6 g/dL (ref 30.0–36.0)
MCV: 85.5 fL (ref 80.0–100.0)
Monocytes Absolute: 0.5 10*3/uL (ref 0.1–1.0)
Monocytes Relative: 7 %
Neutro Abs: 5 10*3/uL (ref 1.7–7.7)
Neutrophils Relative %: 64 %
Platelets: 240 10*3/uL (ref 150–400)
RBC: 3.79 MIL/uL — ABNORMAL LOW (ref 4.22–5.81)
RDW: 16.7 % — ABNORMAL HIGH (ref 11.5–15.5)
WBC: 7.9 10*3/uL (ref 4.0–10.5)
nRBC: 0.4 % — ABNORMAL HIGH (ref 0.0–0.2)

## 2022-07-26 LAB — RESP PANEL BY RT-PCR (FLU A&B, COVID) ARPGX2
Influenza A by PCR: NEGATIVE
Influenza B by PCR: NEGATIVE
SARS Coronavirus 2 by RT PCR: NEGATIVE

## 2022-07-26 LAB — TROPONIN I (HIGH SENSITIVITY)
Troponin I (High Sensitivity): 40 ng/L — ABNORMAL HIGH (ref <18)
Troponin I (High Sensitivity): 40 ng/L — ABNORMAL HIGH (ref <18)

## 2022-07-26 LAB — BRAIN NATRIURETIC PEPTIDE: B Natriuretic Peptide: 3067.7 pg/mL — ABNORMAL HIGH (ref 0.0–100.0)

## 2022-07-26 MED ORDER — FUROSEMIDE 10 MG/ML IJ SOLN
40.0000 mg | Freq: Once | INTRAMUSCULAR | Status: AC
  Administered 2022-07-26: 40 mg via INTRAVENOUS
  Filled 2022-07-26: qty 4

## 2022-07-26 MED ORDER — FUROSEMIDE 10 MG/ML IJ SOLN
20.0000 mg | Freq: Once | INTRAMUSCULAR | Status: AC
  Administered 2022-07-26: 20 mg via INTRAVENOUS
  Filled 2022-07-26: qty 2

## 2022-07-26 NOTE — Plan of Care (Signed)
   Patient Name: mical, kicklighter DOB: 11/23/1945 MRN: [id number] Transferring facility: [facility] Requesting provider: Theresia Lo, DO Reason for transfer: acute on chronic systolic CHF 76 yo male with hx of systolic CHF LVEF 40%(dry weight 156 lbs), hx of asthma, CAD, with 2 days of SOB with DOE. + orthopnea. +JVD. BNP 3067. CXR mild pulmonary edema. follows with [facility] in [place]. Going to: [facility] Admission Status: observation Bed Type: cardiac tele To Do:  TRH will assume care on arrival to accepting facility. Until arrival, care as per EDP. However, TRH available 24/7 for questions and assistance.   Nursing staff please page [facility] Admits and Consults [phone]) as soon as the patient arrives to the hospital.   Carollee Herter, DO [facility]

## 2022-07-26 NOTE — ED Notes (Signed)
ED TO INPATIENT HANDOFF REPORT  ED Nurse Name and Phone #: Dominga [facility] Paramedic [phone]  S Name/Age/Gender Cody Small 76 y.o. male Room/Bed: MH10/MH10  Code Status   Code Status: Not on file  Home/SNF/Other Home Patient oriented to: self, place, time, and situation Is this baseline? Yes   Triage Complete: Triage complete  Chief Complaint Acute on chronic systolic CHF (congestive heart failure) (HCC) [I50.23]  Triage Note Per pt having a asthma exacerbation x 2 days  Left upper lobe wheezing   Uses symbicort  Does not have albuterol inhaler    Allergies No Known Allergies  Level of Care/Admitting Diagnosis ED Disposition     ED Disposition  Admit   Condition  --   Comment  Hospital Area: [facility] [[id number]]  Level of Care: Telemetry Cardiac [103]  Interfacility transfer: Yes  May place patient in observation at [facility] or [facility] if equivalent level of care is available:: Yes  Covid Evaluation: Asymptomatic - no recent exposure (last 10 days) testing not required  Diagnosis: Acute on chronic systolic CHF (congestive heart failure) [facility]) [[id number]]  Admitting Physician: Imogene Burn ERIC [[id number]]  Attending Physician: Imogene Burn, ERIC [[id number]]          B Medical/Surgery History Past Medical History:  Diagnosis Date   Asthma    MI (myocardial infarction) (HCC)    History reviewed. No pertinent surgical history.   A IV Location/Drains/Wounds Patient Lines/Drains/Airways Status     Active Line/Drains/Airways     Name Placement date Placement time Site Days   Peripheral IV 07/26/22 20 G Right Antecubital 07/26/22  1808  Antecubital  less than 1            Intake/Output Last 24 hours  Intake/Output Summary (Last 24 hours) at 07/26/2022 2155 Last data filed at 07/26/2022 2124 Gross per 24 hour  Intake --  Output 900 ml  Net -900 ml    Labs/Imaging Results for orders placed or performed during the hospital encounter of  07/26/22 (from the past 48 hour(s))  Troponin I (High Sensitivity)     Status: Abnormal   Collection Time: 07/26/22  6:09 PM  Result Value Ref Range   Troponin I (High Sensitivity) 40 (H) <18 ng/L    Comment: (NOTE) Elevated high sensitivity troponin I (hsTnI) values and significant  changes across serial measurements may suggest ACS but many other  chronic and acute conditions are known to elevate hsTnI results.  Refer to the "Links" section for chest pain algorithms and additional  guidance. Performed at [facility], [address] [place], [place] [address]   CBC with Differential     Status: Abnormal   Collection Time: 07/26/22  6:09 PM  Result Value Ref Range   WBC 7.9 4.0 - 10.5 K/uL   RBC 3.79 (L) 4.22 - 5.81 MIL/uL   Hemoglobin 10.9 (L) 13.0 - 17.0 g/dL   HCT 95.2 (L) 84.1 - 32.4 %   MCV 85.5 80.0 - 100.0 fL   MCH 28.8 26.0 - 34.0 pg   MCHC 33.6 30.0 - 36.0 g/dL   RDW 40.1 (H) 02.7 - 25.3 %   Platelets 240 150 - 400 K/uL   nRBC 0.4 (H) 0.0 - 0.2 %   Neutrophils Relative % 64 %   Neutro Abs 5.0 1.7 - 7.7 K/uL   Lymphocytes Relative 23 %   Lymphs Abs 1.8 0.7 - 4.0 K/uL   Monocytes Relative 7 %   Monocytes Absolute  0.5 0.1 - 1.0 K/uL   Eosinophils Relative 5 %   Eosinophils Absolute 0.4 0.0 - 0.5 K/uL   Basophils Relative 1 %   Basophils Absolute 0.1 0.0 - 0.1 K/uL   Immature Granulocytes 0 %   Abs Immature Granulocytes 0.03 0.00 - 0.07 K/uL    Comment: Performed at [facility], [address] [place], [place] [address]  Resp Panel by RT-PCR (Flu A&B, Covid) Anterior Nasal Swab     Status: None   Collection Time: 07/26/22  6:09 PM   Specimen: Anterior Nasal Swab  Result Value Ref Range   SARS Coronavirus 2 by RT PCR NEGATIVE NEGATIVE    Comment: (NOTE) SARS-CoV-2 target nucleic acids are NOT DETECTED.  The SARS-CoV-2 RNA is generally detectable in upper respiratory specimens during the acute phase of infection. The lowest concentration  of SARS-CoV-2 viral copies this assay can detect is 138 copies/mL. A negative result does not preclude SARS-Cov-2 infection and should not be used as the sole basis for treatment or other patient management decisions. A negative result may occur with  improper specimen collection/handling, submission of specimen other than nasopharyngeal swab, presence of viral mutation(s) within the areas targeted by this assay, and inadequate number of viral copies(<138 copies/mL). A negative result must be combined with clinical observations, patient history, and epidemiological information. The expected result is Negative.  Fact Sheet for Patients:  BloggerCourse.com  Fact Sheet for Healthcare Providers:  SeriousBroker.it  This test is no t yet approved or cleared by the [place] FDA and  has been authorized for detection and/or diagnosis of SARS-CoV-2 by FDA under an Emergency Use Authorization (EUA). This EUA will remain  in effect (meaning this test can be used) for the duration of the COVID-19 declaration under Section 564(b)(1) of the Act, 21 U.S.C.section 360bbb-3(b)(1), unless the authorization is terminated  or revoked sooner.       Influenza A by PCR NEGATIVE NEGATIVE   Influenza B by PCR NEGATIVE NEGATIVE    Comment: (NOTE) The Xpert Xpress SARS-CoV-2/FLU/RSV plus assay is intended as an aid in the diagnosis of influenza from Nasopharyngeal swab specimens and should not be used as a sole basis for treatment. Nasal washings and aspirates are unacceptable for Xpert Xpress SARS-CoV-2/FLU/RSV testing.  Fact Sheet for Patients: BloggerCourse.com  Fact Sheet for Healthcare Providers: SeriousBroker.it  This test is not yet approved or cleared by the [place] FDA and has been authorized for detection and/or diagnosis of SARS-CoV-2 by FDA under an Emergency Use Authorization  (EUA). This EUA will remain in effect (meaning this test can be used) for the duration of the COVID-19 declaration under Section 564(b)(1) of the Act, 21 U.S.C. section 360bbb-3(b)(1), unless the authorization is terminated or revoked.  Performed at [facility], [address] [place], [place] [address]   Brain natriuretic peptide     Status: Abnormal   Collection Time: 07/26/22  6:44 PM  Result Value Ref Range   B Natriuretic Peptide 3,067.7 (H) 0.0 - 100.0 pg/mL    Comment: Performed at [facility], [address] [place], [place] [address]  Basic metabolic panel     Status: Abnormal   Collection Time: 07/26/22  6:44 PM  Result Value Ref Range   Sodium 136 135 - 145 mmol/L   Potassium 3.5 3.5 - 5.1 mmol/L   Chloride 109 98 - 111 mmol/L   CO2 18 (L) 22 - 32 mmol/L   Glucose,  Bld 109 (H) 70 - 99 mg/dL    Comment: Glucose reference range applies only to samples taken after fasting for at least 8 hours.   BUN 24 (H) 8 - 23 mg/dL   Creatinine, Ser 1.61 (H) 0.61 - 1.24 mg/dL   Calcium 8.7 (L) 8.9 - 10.3 mg/dL   GFR, Estimated 39 (L) >60 mL/min    Comment: (NOTE) Calculated using the CKD-EPI Creatinine Equation (2021)    Anion gap 9 5 - 15    Comment: Performed at [facility], [address] [place], [place] [address]  Troponin I (High Sensitivity)     Status: Abnormal   Collection Time: 07/26/22  8:18 PM  Result Value Ref Range   Troponin I (High Sensitivity) 40 (H) <18 ng/L    Comment: (NOTE) Elevated high sensitivity troponin I (hsTnI) values and significant  changes across serial measurements may suggest ACS but many other  chronic and acute conditions are known to elevate hsTnI results.  Refer to the "Links" section for chest pain algorithms and additional  guidance. Performed at [facility], [address] [place], [place] [address]    DG Chest 2 View  Result Date: 07/26/2022 CLINICAL DATA:  Shortness of breath EXAM:  CHEST - 2 VIEW COMPARISON:  Previous studies including the examination of 05/25/2014 FINDINGS: Transverse diameter of heart is slightly increased. Central pulmonary vessels are prominent. There is subtle increase in interstitial markings in parahilar regions and lower lung fields. There is no focal consolidation. There is minimal blunting of right lateral CP angle, possibly suggesting minimal pleural effusion. There is no pneumothorax. IMPRESSION: The subtle increase in interstitial markings in parahilar regions and lower lung fields suggesting mild interstitial edema or interstitial pneumonia. There is no focal pulmonary consolidation. Electronically Signed   By: Ernie Avena M.D.   On: 07/26/2022 16:44    Pending Labs Unresulted Labs (From admission, onward)    None       Vitals/Pain Today's Vitals   07/26/22 1800 07/26/22 1915 07/26/22 2014 07/26/22 2015  BP: (!) 156/100 (!) 172/92  (!) 172/104  Pulse: 65 70  72  Resp: (!) 27 (!) 23  (!) 30  Temp:   98.9 F (37.2 C)   TempSrc:   Oral   SpO2: 99% 99%  98%  Weight:      Height:      PainSc:        Isolation Precautions No active isolations  Medications Medications  furosemide (LASIX) injection 40 mg (40 mg Intravenous Given 07/26/22 1809)  furosemide (LASIX) injection 20 mg (20 mg Intravenous Given 07/26/22 2040)    Mobility walks with person assist Low fall risk   Focused Assessments Cardiac Assessment Handoff:    No results found for: "CKTOTAL", "CKMB", "CKMBINDEX", "TROPONINI" No results found for: "DDIMER" Does the Patient currently have chest pain? No   , Renal Assessment Handoff:      R Recommendations: See Admitting Provider Note  Report given to:   Additional Notes:

## 2022-07-26 NOTE — ED Provider Notes (Signed)
[facility] EMERGENCY DEPARTMENT Provider Note   CSN: [id number] Arrival date & time: 07/26/22  1605     History  Chief Complaint  Patient presents with   Shortness of Breath    Cody Small is a 76 y.o. male.  Patient is a 76 year old male with a past medical history of CAD S post stent placement, CHF, asthma presenting to the emergency department with shortness of breath.  Patient states he has had increasing dyspnea on exertion over the last several days.  He also endorses orthopnea.  He states that he has been taking his inhalers at home but has not had significant relief.  He denies any fevers or chills.  He states that he has had a mild nonproductive cough.  He denies any lower extremity swelling.  He denies any chest pain.  He denies any congestion, sore throat or any known sick contacts.  The history is provided by the patient and a relative.  Shortness of Breath      Home Medications Prior to Admission medications   Medication Sig Start Date End Date Taking? Authorizing Provider  budesonide-formoterol (SYMBICORT) 160-4.5 MCG/ACT inhaler Inhale 2 puffs into the lungs 2 (two) times daily. 02/21/22  Yes [provider]  hydrOXYzine (ATARAX) 10 MG tablet Take 1 tablet by mouth 3 (three) times daily as needed. 06/22/22  Yes [provider]  lisinopril (ZESTRIL) 5 MG tablet Take 1 tablet by mouth daily. 11/27/21  Yes [provider]  metoprolol succinate (TOPROL-XL) 25 MG 24 hr tablet Take 1 tablet by mouth daily. 05/22/22  Yes [provider]  azithromycin (ZITHROMAX) 250 MG tablet Take 1 tablet (250 mg total) by mouth daily. Take first 2 tablets together, then 1 every day until finished. 05/25/14   Elson Areas, PA-C  predniSONE (DELTASONE) 10 MG tablet 5,4,3,2,1 taper 05/25/14   Elson Areas, PA-C      Allergies    Patient has no known allergies.    Review of Systems   Review of Systems  Respiratory:  Positive for shortness of  breath.     Physical Exam Updated Vital Signs BP (!) 172/92   Pulse 70   Temp 98.9 F (37.2 C) (Oral)   Resp (!) 23   Ht 5\' 7"  (1.702 m)   Wt 84.8 kg   SpO2 99%   BMI 29.28 kg/m  Physical Exam Vitals and nursing note reviewed.  Constitutional:      General: He is not in acute distress.    Appearance: He is well-developed.  HENT:     Head: Normocephalic and atraumatic.     Mouth/Throat:     Mouth: Mucous membranes are moist.     Pharynx: Oropharynx is clear.  Eyes:     Extraocular Movements: Extraocular movements intact.  Neck:     Vascular: JVD (To jaw) present.  Cardiovascular:     Rate and Rhythm: Normal rate and regular rhythm.     Pulses: Normal pulses.     Heart sounds: Normal heart sounds.  Pulmonary:     Effort: Pulmonary effort is normal.     Breath sounds: Normal breath sounds. No wheezing.  Abdominal:     Palpations: Abdomen is soft.     Tenderness: There is no abdominal tenderness.  Musculoskeletal:        General: Normal range of motion.     Cervical back: Normal range of motion and neck supple.     Right lower leg: Edema (Trace, nonpitting) present.  Left lower leg: Edema (Trace, nonpitting) present.  Skin:    General: Skin is warm and dry.  Neurological:     General: No focal deficit present.     Mental Status: He is alert and oriented to person, place, and time.  Psychiatric:        Mood and Affect: Mood normal.        Behavior: Behavior normal.     ED Results / Procedures / Treatments   Labs (all labs ordered are listed, but only abnormal results are displayed) Labs Reviewed  BRAIN NATRIURETIC PEPTIDE - Abnormal; Notable for the following components:      Result Value   B Natriuretic Peptide 3,067.7 (*)    All other components within normal limits  BASIC METABOLIC PANEL - Abnormal; Notable for the following components:   CO2 18 (*)    Glucose, Bld 109 (*)    BUN 24 (*)    Creatinine, Ser 1.81 (*)    Calcium 8.7 (*)    GFR,  Estimated 39 (*)    All other components within normal limits  CBC WITH DIFFERENTIAL/PLATELET - Abnormal; Notable for the following components:   RBC 3.79 (*)    Hemoglobin 10.9 (*)    HCT 32.4 (*)    RDW 16.7 (*)    nRBC 0.4 (*)    All other components within normal limits  TROPONIN I (HIGH SENSITIVITY) - Abnormal; Notable for the following components:   Troponin I (High Sensitivity) 40 (*)    All other components within normal limits  RESP PANEL BY RT-PCR (FLU A&B, COVID) [id number]  TROPONIN I (HIGH SENSITIVITY)    EKG None  Radiology DG Chest 2 View  Result Date: 07/26/2022 CLINICAL DATA:  Shortness of breath EXAM: CHEST - 2 VIEW COMPARISON:  Previous studies including the examination of 05/25/2014 FINDINGS: Transverse diameter of heart is slightly increased. Central pulmonary vessels are prominent. There is subtle increase in interstitial markings in parahilar regions and lower lung fields. There is no focal consolidation. There is minimal blunting of right lateral CP angle, possibly suggesting minimal pleural effusion. There is no pneumothorax. IMPRESSION: The subtle increase in interstitial markings in parahilar regions and lower lung fields suggesting mild interstitial edema or interstitial pneumonia. There is no focal pulmonary consolidation. Electronically Signed   By: Ernie Avena M.D.   On: 07/26/2022 16:44    Procedures Procedures    Medications Ordered in ED Medications  furosemide (LASIX) injection 40 mg (40 mg Intravenous Given 07/26/22 1809)    ED Course/ Medical Decision Making/ A&P Clinical Course as of 07/26/22 2018  Thu Jul 26, 2022  1929 Baseline Cr 1.67 in recent cardiology records but work up otherwise consistent with CHF exacerbation. [VK]    Clinical Course User Index [VK] Rexford Maus, DO                           Medical Decision Making This patient presents to the ED with chief complaint(s) of shortness of breath with pertinent past  medical history of CAD, CHF, asthma, hypertension, diabetes which further complicates the presenting complaint. The complaint involves an extensive differential diagnosis and also carries with it a high risk of complications and morbidity.    The differential diagnosis includes patient has no wheezing on exam making asthma exacerbation less likely, considering CHF exacerbation, ACS, arrhythmia, anemia, pulmonary edema, pleural effusion, pneumonia, viral syndrome  Additional history obtained: Additional history obtained from family  Records reviewed Care Everywhere/External Records -outpatient cardiology records, per records he is prescribed torsemide 20 mg daily though per patient he has been taking torsemide every other day  ED Course and Reassessment: On patient's arrival to the emergency department he is awake and alert in no acute distress.  He does have significant JVD to his jaw and trace edema to bilateral lower extremities.  Chest x-ray was performed in triage that showed concern for multifocal pneumonia versus pulmonary edema.  The patient does appear volume overloaded on exam, concern for CHF exacerbation and will have labs including BNP and troponin, EKG and will be started on 20 mg of IV Lasix and will be closely reassessed.  Independent labs interpretation:  The following labs were independently interpreted: Mild troponin elevation, significantly elevated BNP, remainder of labs at patient's baseline  Independent visualization of imaging: - I independently visualized the following imaging with scope of interpretation limited to determining acute life threatening conditions related to emergency care: Chest x-ray, which revealed pulmonary edema  Consultation: - Consulted or discussed management/test interpretation w/ external professional: Hospitalists  Consideration for admission or further workup: Patient requires admission for further workup and management of his CHF  exacerbation Social Determinants of health: N/A    Amount and/or Complexity of Data Reviewed Labs: ordered. Radiology: ordered.  Risk Prescription drug management.          Final Clinical Impression(s) / ED Diagnoses Final diagnoses:  Acute on chronic congestive heart failure, unspecified heart failure type [facility])    Rx / DC Orders ED Discharge Orders     None         Rexford Maus, DO 07/26/22 2019

## 2022-07-26 NOTE — ED Triage Notes (Signed)
Per pt having a asthma exacerbation x 2 days  Left upper lobe wheezing   Uses symbicort  Does not have albuterol inhaler

## 2022-07-27 ENCOUNTER — Observation Stay (HOSPITAL_BASED_OUTPATIENT_CLINIC_OR_DEPARTMENT_OTHER): Payer: Medicare HMO

## 2022-07-27 ENCOUNTER — Other Ambulatory Visit (HOSPITAL_COMMUNITY): Payer: Self-pay

## 2022-07-27 ENCOUNTER — Encounter (HOSPITAL_COMMUNITY): Payer: Self-pay | Admitting: Internal Medicine

## 2022-07-27 DIAGNOSIS — I130 Hypertensive heart and chronic kidney disease with heart failure and stage 1 through stage 4 chronic kidney disease, or unspecified chronic kidney disease: Secondary | ICD-10-CM | POA: Diagnosis not present

## 2022-07-27 DIAGNOSIS — Z79899 Other long term (current) drug therapy: Secondary | ICD-10-CM | POA: Diagnosis not present

## 2022-07-27 DIAGNOSIS — Z7951 Long term (current) use of inhaled steroids: Secondary | ICD-10-CM | POA: Diagnosis not present

## 2022-07-27 DIAGNOSIS — E119 Type 2 diabetes mellitus without complications: Secondary | ICD-10-CM | POA: Diagnosis not present

## 2022-07-27 DIAGNOSIS — I5023 Acute on chronic systolic (congestive) heart failure: Secondary | ICD-10-CM

## 2022-07-27 DIAGNOSIS — N1832 Chronic kidney disease, stage 3b: Secondary | ICD-10-CM | POA: Diagnosis present

## 2022-07-27 DIAGNOSIS — Z8673 Personal history of transient ischemic attack (TIA), and cerebral infarction without residual deficits: Secondary | ICD-10-CM | POA: Diagnosis not present

## 2022-07-27 DIAGNOSIS — E785 Hyperlipidemia, unspecified: Secondary | ICD-10-CM | POA: Diagnosis not present

## 2022-07-27 DIAGNOSIS — I340 Nonrheumatic mitral (valve) insufficiency: Secondary | ICD-10-CM | POA: Diagnosis not present

## 2022-07-27 DIAGNOSIS — I2510 Atherosclerotic heart disease of native coronary artery without angina pectoris: Secondary | ICD-10-CM | POA: Diagnosis not present

## 2022-07-27 DIAGNOSIS — R54 Age-related physical debility: Secondary | ICD-10-CM | POA: Diagnosis not present

## 2022-07-27 DIAGNOSIS — Z9861 Coronary angioplasty status: Secondary | ICD-10-CM

## 2022-07-27 DIAGNOSIS — Z87891 Personal history of nicotine dependence: Secondary | ICD-10-CM | POA: Diagnosis not present

## 2022-07-27 DIAGNOSIS — Z955 Presence of coronary angioplasty implant and graft: Secondary | ICD-10-CM | POA: Diagnosis not present

## 2022-07-27 DIAGNOSIS — I5043 Acute on chronic combined systolic (congestive) and diastolic (congestive) heart failure: Secondary | ICD-10-CM | POA: Diagnosis not present

## 2022-07-27 DIAGNOSIS — Z1152 Encounter for screening for COVID-19: Secondary | ICD-10-CM | POA: Diagnosis not present

## 2022-07-27 DIAGNOSIS — E1122 Type 2 diabetes mellitus with diabetic chronic kidney disease: Secondary | ICD-10-CM | POA: Diagnosis not present

## 2022-07-27 DIAGNOSIS — I252 Old myocardial infarction: Secondary | ICD-10-CM | POA: Diagnosis not present

## 2022-07-27 DIAGNOSIS — J45909 Unspecified asthma, uncomplicated: Secondary | ICD-10-CM | POA: Diagnosis not present

## 2022-07-27 DIAGNOSIS — U071 COVID-19: Secondary | ICD-10-CM | POA: Diagnosis not present

## 2022-07-27 DIAGNOSIS — Z800 Family history of malignant neoplasm of digestive organs: Secondary | ICD-10-CM | POA: Diagnosis not present

## 2022-07-27 DIAGNOSIS — I272 Pulmonary hypertension, unspecified: Secondary | ICD-10-CM | POA: Diagnosis not present

## 2022-07-27 DIAGNOSIS — I2582 Chronic total occlusion of coronary artery: Secondary | ICD-10-CM | POA: Diagnosis not present

## 2022-07-27 DIAGNOSIS — Z841 Family history of disorders of kidney and ureter: Secondary | ICD-10-CM | POA: Diagnosis not present

## 2022-07-27 DIAGNOSIS — Z7982 Long term (current) use of aspirin: Secondary | ICD-10-CM | POA: Diagnosis not present

## 2022-07-27 LAB — GLUCOSE, CAPILLARY
Glucose-Capillary: 91 mg/dL (ref 70–99)
Glucose-Capillary: 105 mg/dL — ABNORMAL HIGH (ref 70–99)
Glucose-Capillary: 110 mg/dL — ABNORMAL HIGH (ref 70–99)
Glucose-Capillary: 100 mg/dL — ABNORMAL HIGH (ref 70–99)
Glucose-Capillary: 170 mg/dL — ABNORMAL HIGH (ref 70–99)

## 2022-07-27 LAB — ECHOCARDIOGRAM COMPLETE
AR max vel: 1.68 cm2
AV Area VTI: 1.37 cm2
AV Area mean vel: 1.49 cm2
AV Mean grad: 4.5 mmHg
AV Peak grad: 9.1 mmHg
Ao pk vel: 1.51 m/s
Area-P 1/2: 4.8 cm2
Calc EF: 27.8 %
Height: 67 in
MV M vel: 4.38 m/s
MV Peak grad: 76.9 mmHg
P 1/2 time: 609 msec
S' Lateral: 5 cm
Single Plane A2C EF: 28 %
Single Plane A4C EF: 30 %
Weight: 2500.8 oz

## 2022-07-27 LAB — HEMOGLOBIN A1C
Hgb A1c MFr Bld: 6.6 % — ABNORMAL HIGH (ref 4.8–5.6)
Mean Plasma Glucose: 143 mg/dL

## 2022-07-27 MED ORDER — ACETAMINOPHEN 325 MG PO TABS: 650.0000 mg | ORAL_TABLET | ORAL | Status: DC | PRN

## 2022-07-27 MED ORDER — METOPROLOL SUCCINATE ER 25 MG PO TB24
25.0000 mg | ORAL_TABLET | Freq: Every day | ORAL | Status: DC
  Administered 2022-07-27 – 2022-08-01 (×6): 25 mg via ORAL
  Filled 2022-07-27 (×6): qty 1

## 2022-07-27 MED ORDER — PERFLUTREN LIPID MICROSPHERE
1.0000 mL | INTRAVENOUS | Status: AC | PRN
  Administered 2022-07-27: 2 mL via INTRAVENOUS

## 2022-07-27 MED ORDER — FUROSEMIDE 10 MG/ML IJ SOLN
40.0000 mg | Freq: Two times a day (BID) | INTRAMUSCULAR | Status: DC
  Administered 2022-07-27 – 2022-07-29 (×4): 40 mg via INTRAVENOUS
  Filled 2022-07-27 (×4): qty 4

## 2022-07-27 MED ORDER — LISINOPRIL 5 MG PO TABS: 5.0000 mg | ORAL_TABLET | Freq: Every day | ORAL | Status: DC

## 2022-07-27 MED ORDER — ENOXAPARIN SODIUM 40 MG/0.4ML IJ SOSY
40.0000 mg | PREFILLED_SYRINGE | INTRAMUSCULAR | Status: DC
  Administered 2022-07-27 – 2022-07-29 (×3): 40 mg via SUBCUTANEOUS
  Filled 2022-07-27 (×4): qty 0.4

## 2022-07-27 MED ORDER — SODIUM CHLORIDE 0.9% FLUSH: 3.0000 mL | INTRAVENOUS | Status: DC | PRN

## 2022-07-27 MED ORDER — ATORVASTATIN CALCIUM 80 MG PO TABS
80.0000 mg | ORAL_TABLET | Freq: Every day | ORAL | Status: DC
  Administered 2022-07-27 – 2022-08-01 (×6): 80 mg via ORAL
  Filled 2022-07-27 (×6): qty 1

## 2022-07-27 MED ORDER — POTASSIUM CHLORIDE CRYS ER 20 MEQ PO TBCR
40.0000 meq | EXTENDED_RELEASE_TABLET | Freq: Two times a day (BID) | ORAL | Status: AC
  Administered 2022-07-27 (×2): 40 meq via ORAL
  Filled 2022-07-27 (×2): qty 2

## 2022-07-27 MED ORDER — SODIUM CHLORIDE 0.9 % IV SOLN: 250.0000 mL | INTRAVENOUS | Status: DC | PRN

## 2022-07-27 MED ORDER — SPIRONOLACTONE 12.5 MG HALF TABLET
12.5000 mg | ORAL_TABLET | Freq: Every day | ORAL | Status: DC
  Administered 2022-07-27 – 2022-07-30 (×4): 12.5 mg via ORAL
  Filled 2022-07-27 (×4): qty 1

## 2022-07-27 MED ORDER — ONDANSETRON HCL 4 MG/2ML IJ SOLN: 4.0000 mg | Freq: Four times a day (QID) | INTRAMUSCULAR | Status: DC | PRN

## 2022-07-27 MED ORDER — SODIUM CHLORIDE 0.9% FLUSH
3.0000 mL | Freq: Two times a day (BID) | INTRAVENOUS | Status: DC
  Administered 2022-07-27 – 2022-07-30 (×8): 3 mL via INTRAVENOUS

## 2022-07-27 MED ORDER — ASPIRIN 81 MG PO TBEC
81.0000 mg | DELAYED_RELEASE_TABLET | Freq: Every day | ORAL | Status: DC
  Administered 2022-07-27 – 2022-08-01 (×6): 81 mg via ORAL
  Filled 2022-07-27 (×6): qty 1

## 2022-07-27 MED ORDER — POTASSIUM CHLORIDE CRYS ER 10 MEQ PO TBCR
10.0000 meq | EXTENDED_RELEASE_TABLET | ORAL | Status: DC
  Administered 2022-07-27: 10 meq via ORAL
  Filled 2022-07-27: qty 1

## 2022-07-27 MED ORDER — FUROSEMIDE 10 MG/ML IJ SOLN
40.0000 mg | Freq: Every day | INTRAMUSCULAR | Status: DC
  Administered 2022-07-27: 40 mg via INTRAVENOUS
  Filled 2022-07-27: qty 4

## 2022-07-27 MED ORDER — MOMETASONE FURO-FORMOTEROL FUM 200-5 MCG/ACT IN AERO
2.0000 | INHALATION_SPRAY | Freq: Two times a day (BID) | RESPIRATORY_TRACT | Status: DC
  Administered 2022-07-27 – 2022-08-01 (×11): 2 via RESPIRATORY_TRACT
  Filled 2022-07-27 (×2): qty 8.8

## 2022-07-27 NOTE — Assessment & Plan Note (Addendum)
1 vessel dz s/p stent, completed 42yr plavix, recently cardiologist DCd his plavix. Cont ASA, statin, lisinopril, BB

## 2022-07-27 NOTE — Progress Notes (Signed)
Rounding Note    Patient Name: Cody Small Date of Encounter: 07/28/2022  [facility] Cardiologist: None   Subjective   Feels better today. States breathing is improving.   Cr improved 1.8>1.69 Net negative 2.1L Wt 156>145lbs  Inpatient Medications    Scheduled Meds:  aspirin EC  81 mg Oral Daily   atorvastatin  80 mg Oral Daily   enoxaparin (LOVENOX) injection  40 mg Subcutaneous Q24H   furosemide  40 mg Intravenous BID   metoprolol succinate  25 mg Oral Daily   mometasone-formoterol  2 puff Inhalation BID   sodium chloride flush  3 mL Intravenous Q12H   spironolactone  12.5 mg Oral Daily   Continuous Infusions:  sodium chloride     PRN Meds: sodium chloride, acetaminophen, ondansetron (ZOFRAN) IV, sodium chloride flush   Vital Signs    Vitals:   07/27/22 2000 07/27/22 2029 07/28/22 0546 07/28/22 0816  BP: (!) 147/72  136/85   Pulse: 70  60   Resp: 19  19   Temp: 98.2 F (36.8 C)  98.2 F (36.8 C)   TempSrc: Oral  Oral   SpO2: 98% 98% 95% 95%  Weight:   66.2 kg   Height:        Intake/Output Summary (Last 24 hours) at 07/28/2022 0827 Last data filed at 07/28/2022 0554 Gross per 24 hour  Intake 243 ml  Output 2700 ml  Net -2457 ml      07/28/2022    5:46 AM 07/27/2022    1:14 AM 07/26/2022    4:10 PM  Last 3 Weights  Weight (lbs) 145 lb 15.1 oz 156 lb 4.8 oz 186 lb 15.2 oz  Weight (kg) 66.2 kg 70.897 kg 84.8 kg      Telemetry    NSR - Personally Reviewed  ECG    12/7: SNR, LAFB, diffuse TWI - Personally Reviewed  Physical Exam   GEN: Elderly male, comfortable, NAD  Neck: JVD to mid neck Cardiac: RRR, 2/6 systolic murmur Respiratory: Crackles about 1/3 up lung fields GI: Soft, nontender, non-distended  MS: Trace edema, warm Neuro:  Nonfocal  Psych: Normal affect   Labs    High Sensitivity Troponin:   Recent Labs  Lab 07/26/22 1809 07/26/22 2018  TROPONINIHS 40* 40*     Chemistry Recent Labs  Lab 07/26/22 1844  07/28/22 0137  NA 136 136  K 3.5 3.5  CL 109 103  CO2 18* 24  GLUCOSE 109* 111*  BUN 24* 26*  CREATININE 1.81* 1.69*  CALCIUM 8.7* 8.9  GFRNONAA 39* 42*  ANIONGAP 9 9    Lipids No results for input(s): "CHOL", "TRIG", "HDL", "LABVLDL", "LDLCALC", "CHOLHDL" in the last 168 hours.  Hematology Recent Labs  Lab 07/26/22 1809  WBC 7.9  RBC 3.79*  HGB 10.9*  HCT 32.4*  MCV 85.5  MCH 28.8  MCHC 33.6  RDW 16.7*  PLT 240   Thyroid No results for input(s): "TSH", "FREET4" in the last 168 hours.  BNP Recent Labs  Lab 07/26/22 1844  BNP 3,067.7*    DDimer No results for input(s): "DDIMER" in the last 168 hours.   Radiology    ECHOCARDIOGRAM COMPLETE  Result Date: 07/27/2022    ECHOCARDIOGRAM REPORT   Patient Name:   Cody Small Date of Exam: 07/27/2022 Medical Rec #:  [id number]   Height:       67.0 in Accession #:    [id number]  Weight:       156.3 lb  Date of Birth:  [birth year]    BSA:          1.821 m Patient Age:    76 years    BP:           149/91 mmHg Patient Gender: M           HR:           60 bpm. Exam Location:  Inpatient Procedure: 2D Echo and Intracardiac Opacification Agent Indications:    CHF  History:        Patient has no prior history of Echocardiogram examinations.  Sonographer:    Cathie Hoops Referring Phys: Heywood Iles GARDNER IMPRESSIONS  1. Left ventricular ejection fraction, by estimation, is 25 to 30%. The left ventricle has severely decreased function. The left ventricle demonstrates regional wall motion abnormalities (see scoring diagram/findings for description). The left ventricular internal cavity size was mildly to moderately dilated. Left ventricular diastolic parameters are consistent with Grade III diastolic dysfunction (restrictive). Elevated left ventricular end-diastolic pressure. There is severe hypokinesis of the left ventricular, entire inferolateral wall.  2. Right ventricular systolic function is moderately reduced. The right ventricular size is normal.  There is moderately elevated pulmonary artery systolic pressure. The estimated right ventricular systolic pressure is 58.1 mmHg.  3. Right atrial size was mildly dilated.  4. Large pleural effusion in both left and right lateral regions.  5. The mitral valve is normal in structure. Mild to moderate mitral valve regurgitation. No evidence of mitral stenosis.  6. The aortic valve is tricuspid. There is mild calcification of the aortic valve. There is mild thickening of the aortic valve. Aortic valve regurgitation is mild. Aortic valve sclerosis/calcification is present, without any evidence of aortic stenosis. Aortic regurgitation PHT measures 609 msec. Aortic valve mean gradient measures 4.5 mmHg.  7. The inferior vena cava is normal in size with <50% respiratory variability, suggesting right atrial pressure of 8 mmHg. Comparison(s): Prior images unable to be directly viewed, comparison made by report only. Echo report from [facility] 05/05/21: EF 40-45%, hypokinesis of inferolateral wall, moderate pulmonary hypertension. Conclusion(s)/Recommendation(s): Severely reduced LVEF, worse compared to prior report. Findings communicated to Dr. Jomarie Longs. FINDINGS  Left Ventricle: GLobal hypokinesis with severe hypokinesis of inferolateral wall. Echo contrast shows trabeculation but no evidence of LV thrombus. Left ventricular ejection fraction, by estimation, is 25 to 30%. The left ventricle has severely decreased function. The left ventricle demonstrates regional wall motion abnormalities. Severe hypokinesis of the left ventricular, entire inferolateral wall. Definity contrast agent was given IV to delineate the left ventricular endocardial borders. The  left ventricular internal cavity size was mildly to moderately dilated. There is no left ventricular hypertrophy. Left ventricular diastolic parameters are consistent with Grade III diastolic dysfunction (restrictive). Elevated left ventricular end-diastolic pressure. Right  Ventricle: The right ventricular size is normal. Right vetricular wall thickness was not well visualized. Right ventricular systolic function is moderately reduced. There is moderately elevated pulmonary artery systolic pressure. The tricuspid regurgitant velocity is 3.54 m/s, and with an assumed right atrial pressure of 8 mmHg, the estimated right ventricular systolic pressure is 58.1 mmHg. Left [facility]: Left atrial size was normal in size. Right [facility]: Right atrial size was mildly dilated. Pericardium: There is no evidence of pericardial effusion. Mitral Valve: The mitral valve is normal in structure. Mild to moderate mitral valve regurgitation. No evidence of mitral valve stenosis. Tricuspid Valve: The tricuspid valve is normal in structure. Tricuspid valve regurgitation is mild . No evidence of tricuspid  stenosis. Aortic Valve: The aortic valve is tricuspid. There is mild calcification of the aortic valve. There is mild thickening of the aortic valve. Aortic valve regurgitation is mild. Aortic regurgitation PHT measures 609 msec. Aortic valve sclerosis/calcification is present, without any evidence of aortic stenosis. Aortic valve mean gradient measures 4.5 mmHg. Aortic valve peak gradient measures 9.1 mmHg. Aortic valve area, by VTI measures 1.37 cm. Pulmonic Valve: The pulmonic valve was grossly normal. Pulmonic valve regurgitation is mild. No evidence of pulmonic stenosis. Aorta: The aortic root, ascending aorta, aortic arch and descending aorta are all structurally normal, with no evidence of dilitation or obstruction. Venous: The inferior vena cava is normal in size with less than 50% respiratory variability, suggesting right atrial pressure of 8 mmHg. IAS/Shunts: The atrial septum is grossly normal. Additional Comments: There is a large pleural effusion in both left and right lateral regions.  LEFT VENTRICLE PLAX 2D LVIDd:         5.60 cm      Diastology LVIDs:         5.00 cm      LV e' medial:    3.38  cm/s LV PW:         0.90 cm      LV E/e' medial:  32.0 LV IVS:        0.90 cm      LV e' lateral:   3.60 cm/s LVOT diam:     2.00 cm      LV E/e' lateral: 30.0 LV SV:         47 LV SV Index:   26 LVOT Area:     3.14 cm  LV Volumes (MOD) LV vol d, MOD A2C: 142.0 ml LV vol d, MOD A4C: 124.5 ml LV vol s, MOD A2C: 102.3 ml LV vol s, MOD A4C: 87.2 ml LV SV MOD A2C:     39.7 ml LV SV MOD A4C:     124.5 ml LV SV MOD BP:      36.9 ml RIGHT VENTRICLE RV Basal diam:  3.60 cm RV Mid diam:    3.40 cm RV S prime:     8.78 cm/s TAPSE (M-mode): 1.1 cm LEFT ATRIUM             Index        RIGHT ATRIUM           Index LA diam:        3.50 cm 1.92 cm/m   RA Area:     14.00 cm LA Vol (A2C):   47.0 ml 25.81 ml/m  RA Volume:   41.10 ml  22.57 ml/m LA Vol (A4C):   41.4 ml 22.73 ml/m LA Biplane Vol: 44.7 ml 24.55 ml/m  AORTIC VALVE                    PULMONIC VALVE AV Area (Vmax):    1.68 cm     PV Vmax:          0.84 m/s AV Area (Vmean):   1.49 cm     PV Peak grad:     2.8 mmHg AV Area (VTI):     1.37 cm     PR End Diast Vel: 14.59 msec AV Vmax:           150.50 cm/s AV Vmean:          98.800 cm/s AV VTI:            0.344  m AV Peak Grad:      9.1 mmHg AV Mean Grad:      4.5 mmHg LVOT Vmax:         80.60 cm/s LVOT Vmean:        47.000 cm/s LVOT VTI:          0.150 m LVOT/AV VTI ratio: 0.44 AI PHT:            609 msec  AORTA Ao Root diam: 3.00 cm Ao Asc diam:  2.70 cm MITRAL VALVE                TRICUSPID VALVE MV Area (PHT): 4.80 cm     TR Peak grad:   50.1 mmHg MV Decel Time: 158 msec     TR Vmax:        354.00 cm/s MR Peak grad: 76.9 mmHg MR Vmax:      438.33 cm/s   SHUNTS MV E velocity: 108.00 cm/s  Systemic VTI:  0.15 m MV A velocity: 27.90 cm/s   Systemic Diam: 2.00 cm MV E/A ratio:  3.87 Jodelle Red MD Electronically signed by Jodelle Red MD Signature Date/Time: 07/27/2022/3:44:12 PM    Final    DG Chest 2 View  Result Date: 07/26/2022 CLINICAL DATA:  Shortness of breath EXAM: CHEST - 2 VIEW  COMPARISON:  Previous studies including the examination of 05/25/2014 FINDINGS: Transverse diameter of heart is slightly increased. Central pulmonary vessels are prominent. There is subtle increase in interstitial markings in parahilar regions and lower lung fields. There is no focal consolidation. There is minimal blunting of right lateral CP angle, possibly suggesting minimal pleural effusion. There is no pneumothorax. IMPRESSION: The subtle increase in interstitial markings in parahilar regions and lower lung fields suggesting mild interstitial edema or interstitial pneumonia. There is no focal pulmonary consolidation. Electronically Signed   By: Ernie Avena M.D.   On: 07/26/2022 16:44    Cardiac Studies   Echocardiogram 07/27/22 1. Left ventricular ejection fraction, by estimation, is 25 to 30%. The  left ventricle has severely decreased function. The left ventricle  demonstrates regional wall motion abnormalities (see scoring  diagram/findings for description). The left  ventricular internal cavity size was mildly to moderately dilated. Left  ventricular diastolic parameters are consistent with Grade III diastolic  dysfunction (restrictive). Elevated left ventricular end-diastolic  pressure. There is severe hypokinesis of  the left ventricular, entire inferolateral wall.   2. Right ventricular systolic function is moderately reduced. The right  ventricular size is normal. There is moderately elevated pulmonary artery  systolic pressure. The estimated right ventricular systolic pressure is  58.1 mmHg.   3. Right atrial size was mildly dilated.   4. Large pleural effusion in both left and right lateral regions.   5. The mitral valve is normal in structure. Mild to moderate mitral valve  regurgitation. No evidence of mitral stenosis.   6. The aortic valve is tricuspid. There is mild calcification of the  aortic valve. There is mild thickening of the aortic valve. Aortic valve   regurgitation is mild. Aortic valve sclerosis/calcification is present,  without any evidence of aortic  stenosis. Aortic regurgitation PHT measures 609 msec. Aortic valve mean  gradient measures 4.5 mmHg.   7. The inferior vena cava is normal in size with <50% respiratory  variability, suggesting right atrial pressure of 8 mmHg.   Comparison(s): Prior images unable to be directly viewed, comparison made  by report only. Echo report from [facility] 05/05/21: EF 40-45%,  hypokinesis of  inferolateral wall, moderate pulmonary hypertension.   Conclusion(s)/Recommendation(s): Severely reduced LVEF, worse compared to  prior report. Findings communicated to Dr. Jomarie Longs.     Patient Profile     76 y.o. male  with a hx of CAD with history of NSTEMI in 04/2020, chronic systolic heart failure, HLD, type 2 DM, stage IIIa CKD, pulmonary HTN who presented with worsening SOB found to have acute on chronic systolic HF exacerbation with worsening in LVEF from 40-45%>25-30% for which Cardiology was consulted.    Assessment & Plan    #Acute on Chronic Combined Systolic and Diastolic HF: Patient with known history of systolic HF [facility]. TTE 04/2021 with LVEF 40-45%, severe inferolateral hypokinesis, moderate pulmonary HTN. Presented on this admission with worsening SOB with BNP 3067. TTE with LVEF 25-30%, G3DD, severe inferolateral hypokinesis, mild-to-moderate MR, mild AR. Remains volume overloaded on exam. Will continue with diuresis. Plan for cath once more euvolemic as patient amenable to stay. -Continue lasix 40mg  IV BID (responding well) -Continue metop 25mg  XL daily -Continue spironolactone 12.5mg  daily -Will add entresto tomorrow pending renal function -Low Na diet -Plan for ischemic evaluation once more euvolemic  #CAD with history of DES to Lcx: #History of NSTEMI: Patient with history of NSTEMI in 04/2020 with 100% Lcx occlusion s/p PCI. Prior TTE 04/2021 40-45% which dropped to 25-30%  on TTE on admission. Trop flat in 40s with no acute ischemic changes on ECG. Denies any chest pain. Will plan for ischemic work-up once clinically more euvolemic. -Plan for ischemic evaluation once more euvolemic -Continue ASA 81mg  daily -Continue metop 25mg  XL daily -Start entresto tomorrow pending rean function  #CKD Stage IIIB: Baseline Cr 1.6. Was 1.8 on admission now back to 1.7.  -Monitor with diuresis  #HLD: -Continue lipitor 80mg  daily  #DMII: -Management per primary      For questions or updates, please contact [facility] Please consult [email or web address] for contact info under        Signed, Meriam Sprague, MD  07/28/2022, 8:27 AM

## 2022-07-27 NOTE — Progress Notes (Signed)
   Heart Failure Stewardship Pharmacist Progress Note   PCP: Charolett Bumpers, PA-C PCP-Cardiologist: None    HPI:  76 yo M with PMH of CAD s/p PCI in 2021 to LCx, CHF (40-45%), HTN, CKD III, T2DM, and asthma.  He presented to the ED on 12/7 with shortness of breath, orthopnea, and mild nonproductive cough. CXR suggestive of mild interstitial edema or pneumonia. ECHO pending.  Current HF Medications: Diuretic: furosemide 40 mg IV BID Beta Blocker: metoprolol XL 25 mg daily MRA: spironolactone 12.5 mg daily  Prior to admission HF Medications: Diuretic: torsemide 20 mg MWF Beta blocker: metoprolol XL 25 mg daily ACE/ARB/ARNI: lisinopril 5 mg daily MRA: spironolactone 12.5 mg daily  Pertinent Lab Values: Serum creatinine 1.81, BUN 24, Potassium 3.5, Sodium 136, BNP 3067.7   Vital Signs: Weight: 156 lbs (admission weight: 186 lbs) - likely erroneous entry  Blood pressure: 150/80s  Heart rate: 60-70s  I/O: -0.9L yesterday; net -1L  Medication Assistance / Insurance Benefits Check: Does the patient have prescription insurance?  Yes Type of insurance plan: [employer]  Outpatient Pharmacy:  Prior to admission outpatient [facility] Is the patient willing to use [facility] TOC pharmacy at discharge? Yes Is the patient willing to transition their outpatient pharmacy to utilize a [facility] outpatient pharmacy?   Pending    Assessment: 1. Acute on chronic systolic and diastolic CHF (LVEF 40-45%, repeat ECHO pending), due to ICM. NYHA class III symptoms. - Continue furosemide 40 mg IV BID. Strict I/Os and daily weights. Keep K>4. Check magnesium in AM. Keep Mag>2. - Continue metoprolol XL 25 mg daily - Trend renal function and add Entresto if stable - Continue spironolactone 12.5 mg daily - Consider adding SGLT2i prior to discharge   Plan: 1) Medication changes recommended at this time: - Continue IV diuresis - Add Jardiance 10 mg daily  2) Patient assistance: - Entresto  copay $10.35 - Farxiga/Jardiance copay $10.35  3)  Education  - Initial education completed - Full education to be completed prior to discharge  Sharen Hones, PharmD, BCPS Heart Failure Engineer, building services Phone [phone]

## 2022-07-27 NOTE — H&P (Signed)
History and Physical    Patient: Cody Small BRA:[id number] DOB: [birth year] DOA: 07/26/2022 DOS: the patient was seen and examined on 07/27/2022 PCP: Charolett Bumpers, PA-C  Patient coming from: Home  Chief Complaint:  Chief Complaint  Patient presents with   Shortness of Breath   HPI: Cody Small is a 76 y.o. male with medical history significant of DM2, HTN, HLD, HFrEF with EF 40-45% CAD s/p stent.  Pt presents to ED with c/o 2 day h/o SOB, DOE, orthopnea.  Symptoms constant.  Not helped by home inhalers.  No CP, congestion, sore throat, nor sick contacts.  No fevers / chills.   Review of Systems: As mentioned in the history of present illness. All other systems reviewed and are negative. Past Medical History:  Diagnosis Date   Asthma    DM2 (diabetes mellitus, type 2) (HCC)    HFrEF (heart failure with reduced ejection fraction) (HCC)    HLD (hyperlipidemia)    HTN (hypertension)    MI (myocardial infarction) [facility])    Past Surgical History:  Procedure Laterality Date   COLONOSCOPY  2021   CORONARY STENT INTERVENTION     Social History:  reports that he has quit smoking. He has never used smokeless tobacco. He reports that he does not drink alcohol and does not use drugs.  No Known Allergies  Family History  Problem Relation Age of Onset   Kidney failure Mother    Cancer Sister    Pancreatic cancer Brother     Prior to Admission medications   Medication Sig Start Date End Date Taking? Authorizing Provider  budesonide-formoterol (SYMBICORT) 160-4.5 MCG/ACT inhaler Inhale 2 puffs into the lungs 2 (two) times daily. 02/21/22  Yes [provider]  hydrOXYzine (ATARAX) 10 MG tablet Take 1 tablet by mouth 3 (three) times daily as needed. 06/22/22  Yes [provider]  lisinopril (ZESTRIL) 5 MG tablet Take 1 tablet by mouth daily. 11/27/21  Yes [provider]  metoprolol succinate (TOPROL-XL) 25 MG 24 hr tablet Take 1 tablet by mouth daily.  05/22/22  Yes [provider]  azithromycin (ZITHROMAX) 250 MG tablet Take 1 tablet (250 mg total) by mouth daily. Take first 2 tablets together, then 1 every day until finished. 05/25/14   Elson Areas, PA-C  predniSONE (DELTASONE) 10 MG tablet 5,4,3,2,1 taper 05/25/14   Elson Areas, [place]    Physical Exam: Vitals:   07/26/22 2014 07/26/22 2015 07/27/22 0008 07/27/22 0114  BP:  (!) 172/104 (!) 169/97 (!) 162/89  Pulse:  72 75 70  Resp:  (!) 30 (!) 25 20  Temp: 98.9 F (37.2 C)  98.6 F (37 C) 97.8 F (36.6 C)  TempSrc: Oral  Oral Oral  SpO2:  98% 99% 97%  Weight:    70.9 kg  Height:    5\' 7"  (1.702 m)   Constitutional: NAD, calm, comfortable Eyes: PERRL, lids and conjunctivae normal ENMT: Mucous membranes are moist. Posterior pharynx clear of any exudate or lesions.Normal dentition.  Neck: JVD present Respiratory: clear to auscultation bilaterally, no wheezing, no crackles. Normal respiratory effort. No accessory muscle use.  Cardiovascular: Regular rate and rhythm, no murmurs / rubs / gallops. Trace BLE edema. 2+ pedal pulses. No carotid bruits.  Abdomen: no tenderness, no masses palpated. No hepatosplenomegaly. Bowel sounds positive.  Musculoskeletal: no clubbing / cyanosis. No joint deformity upper and lower extremities. Good ROM, no contractures. Normal muscle tone.  Skin: no rashes, lesions, ulcers. No induration Neurologic: CN 2-12  grossly intact. Sensation intact, DTR normal. Strength 5/5 in all 4.  Psychiatric: Normal judgment and insight. Alert and oriented x 3. Normal mood.   Data Reviewed:    CMP     Component Value Date/Time   NA 136 07/26/2022 1844   K 3.5 07/26/2022 1844   CL 109 07/26/2022 1844   CO2 18 (L) 07/26/2022 1844   GLUCOSE 109 (H) 07/26/2022 1844   BUN 24 (H) 07/26/2022 1844   CREATININE 1.81 (H) 07/26/2022 1844   CALCIUM 8.7 (L) 07/26/2022 1844   GFRNONAA 39 (L) 07/26/2022 1844   BNP 3067  Trop 40 -> 40  Covid and flu  neg.  CXR IMPRESSION: The subtle increase in interstitial markings in parahilar regions and lower lung fields suggesting mild interstitial edema or interstitial pneumonia. There is no focal pulmonary consolidation.    Assessment and Plan: * Acute on chronic systolic CHF (congestive heart failure) (HCC) CHF pathway Tele monitor 2d echo Diuresing: Got 60mg  IV lasix in ED Will put on lasix 40mg  IV daily for the moment Strict intake and output Daily BMP to monitor renal fxn with diuresis Continue home lisinopril, metoprolol Continue home aldactone  Stage 3b chronic kidney disease (CKD) (HCC) Looks like baseline creat ~1.6 Creat 1.8 today. Monitor daily creat with diuresis.  CAD S/P percutaneous coronary angioplasty 1 vessel dz s/p stent, completed 61yr plavix, recently cardiologist DCd his plavix. Cont ASA, statin, lisinopril, BB  HLD (hyperlipidemia) Cont statin.  DM2 (diabetes mellitus, type 2) (HCC) Diet controlled? Doesn't seem to be on any home meds for this. Will just do CBG checks AC/HS for the moment.      Advance Care Planning:   Code Status: Full Code  Consults: None  Family Communication: No family in room  Severity of Illness: The appropriate patient status for this patient is OBSERVATION. Observation status is judged to be reasonable and necessary in order to provide the required intensity of service to ensure the patient's safety. The patient's presenting symptoms, physical exam findings, and initial radiographic and laboratory data in the context of their medical condition is felt to place them at decreased risk for further clinical deterioration. Furthermore, it is anticipated that the patient will be medically stable for discharge from the hospital within 2 midnights of admission.   Author: ., DO 07/27/2022 1:55 AM  For on call review [email or web address].

## 2022-07-27 NOTE — Plan of Care (Signed)
  Problem: Education: Goal: Knowledge of General Education information will improve Description: Including pain rating scale, medication(s)/side effects and non-pharmacologic comfort measures Outcome: Progressing   Problem: Health Behavior/Discharge Planning: Goal: Ability to manage health-related needs will improve Outcome: Progressing   Problem: Clinical Measurements: Goal: Respiratory complications will improve Outcome: Progressing   Problem: Clinical Measurements: Goal: Cardiovascular complication will be avoided Outcome: Progressing   Problem: Safety: Goal: Ability to remain free from injury will improve Outcome: Progressing   Problem: Skin Integrity: Goal: Risk for impaired skin integrity will decrease Outcome: Progressing   Problem: Cardiac: Goal: Ability to achieve and maintain adequate cardiopulmonary perfusion will improve Outcome: Progressing

## 2022-07-27 NOTE — Consult Note (Addendum)
Cardiology Consultation   Patient ID: Cody KingfisherJames Ki MRN: [id number]; DOB: 04/15/1946  Admit date: 07/26/2022 Date of Consult: 07/27/2022  PCP:  Charolett Bumpersoyle, Patricia K, PA-C   [facility] Providers Cardiologist:  None   -- Followed by [facility]   Patient Profile:   Cody Small is a 76 y.o. male with a hx of CAD, chronic systolic heart failure, HLD, type 2 DM, stage IIIa CKD, pulmonary HTN who is being seen 07/27/2022 for the evaluation of CHF at the request of Dr. Jomarie LongsJoseph.   History of Present Illness:   Cody Small is a 76 year old male with above medical history who is followed by [facility]. Per review of their notes, patient was admitted to [facility] in 04/2020 for treatment of NSTEMI. Underwent cardiac catheterization on 04/22/2020 that showed single-vessel coronary artery disease with 100% occlusion of the proximal Lcx, treated with DES. Echocardiogram on 04/23/2020 showed EF 50-55%, inferoposterior hypokinesis. Since then, he has been followed by [facility] as an outpatient. Repeat echocardiogram 05/05/2021 showed EF 40-45%, severe inferolateral wall hypokinesis, moderate pulmonary HTN. He has been treated with aspirin, lipitor, lisinopril, torsemide, spironolactone, metoprolol.   Patient presented to the ED on 12/7 complaining that he felt like he was having an asthma exacerbation for the past 2 days. Initial vital signs in the ED showed BP 166/97, pulse rate 73 BPM, RR 20 breaths per minute. Labs in the ED showed Na 136, K 3.5, creatinine 1.81, WBC 7.9, hemoglobin 10.9, platelets 240. hsTn 40>>40. BNP elevated to 3067. COVID/Flu negative. EKG showed normal sinus rhythm, nonspecific T wave abnormalities.   Echocardiogram on 07/27/22 showed EF 25-30% with regional wall motion abnormalities, grade III diastolic dysfunction, elevated LVEDP, moderately reduced RV systolic function, mild-moderate MR. Cardiology asked to consult for  treatment of CHF.   On interview, patient reports that he has had progressive dyspnea on exertion for the past few months. SOB acutely worsened over the past 2 days prior to coming to the ED.  Denies orthopnea, chest pain, palpitations, abdominal distention, cough. Reports that he is feeling much better after receiving lasix. Denies tobacco, alcohol, or drug use. He is compliant with all of his medications. Denies having any known heart issues since his MI in 2021.   Past Medical History:  Diagnosis Date   Asthma    DM2 (diabetes mellitus, type 2) (HCC)    HFrEF (heart failure with reduced ejection fraction) (HCC)    HLD (hyperlipidemia)    HTN (hypertension)    MI (myocardial infarction) [facility](HCC)     Past Surgical History:  Procedure Laterality Date   COLONOSCOPY  2021   CORONARY STENT INTERVENTION       Home Medications:  Prior to Admission medications   Medication Sig Start Date End Date Taking? Authorizing Provider  aspirin EC 81 MG tablet Take 81 mg by mouth daily.   Yes [provider]  atorvastatin (LIPITOR) 80 MG tablet Take 80 mg by mouth every evening. 05/22/22  Yes [provider]  budesonide-formoterol (SYMBICORT) 160-4.5 MCG/ACT inhaler Inhale 2 puffs into the lungs 2 (two) times daily. 02/21/22  Yes [provider]  clopidogrel (PLAVIX) 75 MG tablet Take 75 mg by mouth daily. 05/15/22  Yes [provider]  hydrOXYzine (ATARAX) 10 MG tablet Take 10 mg by mouth 3 (three) times daily as needed for itching. 06/22/22  Yes [provider]  lisinopril (ZESTRIL) 5 MG tablet Take 5 mg by  mouth daily. 11/27/21  Yes [provider]  metoprolol succinate (TOPROL-XL) 25 MG 24 hr tablet Take 1 tablet by mouth daily. 05/22/22  Yes [provider]  spironolactone (ALDACTONE) 25 MG tablet Take 12.5 mg by mouth daily. 05/22/22  Yes [provider]  torsemide (DEMADEX) 20 MG tablet Take 20 mg by mouth every Monday, Wednesday, and  Friday. 08/15/21  Yes [provider]  potassium chloride (KLOR-CON) 10 MEQ tablet Take 10 mEq by mouth every Monday, Wednesday, and Friday. Patient not taking: Reported on 07/27/2022 03/19/22   [provider]    Inpatient Medications: Scheduled Meds:  aspirin EC  81 mg Oral Daily   atorvastatin  80 mg Oral Daily   enoxaparin (LOVENOX) injection  40 mg Subcutaneous Q24H   furosemide  40 mg Intravenous BID   metoprolol succinate  25 mg Oral Daily   mometasone-formoterol  2 puff Inhalation BID   potassium chloride  40 mEq Oral BID   sodium chloride flush  3 mL Intravenous Q12H   spironolactone  12.5 mg Oral Daily   Continuous Infusions:  sodium chloride     PRN Meds: sodium chloride, acetaminophen, ondansetron (ZOFRAN) IV, sodium chloride flush  Allergies:   No Known Allergies  Social History:   Social History   Socioeconomic History   Marital status: Married    Spouse name: Olegario Messier   Number of children: 3   Years of education: Not on file   Highest education level: High school graduate  Occupational History   Occupation: Retired  Tobacco Use   Smoking status: Former   Smokeless tobacco: Never  Building services engineer Use: Never used  Substance and Sexual Activity   Alcohol use: No   Drug use: Never   Sexual activity: Not on file  Other Topics Concern   Not on file  Social History Narrative   Not on file   Social Determinants of Health   Financial Resource Strain: Low Risk  (07/27/2022)   Overall Financial Resource Strain (CARDIA)    Difficulty of Paying Living Expenses: Not hard at all  Food Insecurity: No Food Insecurity (07/27/2022)   Hunger Vital Sign    Worried About Radiation protection practitioner of Food in the Last Year: Never true    Ran Out of Food in the Last Year: Never true  Transportation Needs: No Transportation Needs (07/27/2022)   PRAPARE - Administrator, Civil Service (Medical): No    Lack of Transportation (Non-Medical): No  Physical  Activity: Not on file  Stress: Not on file  Social Connections: Not on file  Intimate Partner Violence: Not At Risk (07/27/2022)   Humiliation, Afraid, Rape, and Kick questionnaire    Fear of Current or Ex-Partner: No    Emotionally Abused: No    Physically Abused: No    Sexually Abused: No    Family History:    Family History  Problem Relation Age of Onset   Kidney failure Mother    Cancer Sister    Pancreatic cancer Brother      ROS:  Please see the history of present illness.   All other ROS reviewed and negative.     Physical Exam/Data:   Vitals:   07/27/22 0441 07/27/22 0549 07/27/22 0745 07/27/22 0852  BP: (!) 120/104 (!) 149/91 (!) 148/82 (!) 152/83  Pulse: 66   68  Resp: 19     Temp: 98.6 F (37 C)  97.9 F (36.6 C)   TempSrc: Oral  Oral   SpO2: 97%     Weight:      Height:        Intake/Output Summary (Last 24 hours) at 07/27/2022 1605 Last data filed at 07/27/2022 1400 Gross per 24 hour  Intake 643 ml  Output 2900 ml  Net -2257 ml      07/27/2022    1:14 AM 07/26/2022    4:10 PM 07/23/2017    8:42 PM  Last 3 Weights  Weight (lbs) 156 lb 4.8 oz 186 lb 15.2 oz 187 lb  Weight (kg) 70.897 kg 84.8 kg 84.823 kg     Body mass index is 24.48 kg/m.  General:  Well nourished, well developed, in no acute distress. Sitting on the edge of the bed  HEENT: normal Neck: no JVD noted  Vascular: Radial pulses 2+ bilaterally Cardiac:  normal S1, S2; RRR; no murmur Lungs:  fine crackles in bilateral lung bases. Otherwise clear to auscultation  Abd: soft, nontender, no hepatomegaly  Ext: no edema Musculoskeletal:  No deformities, BUE and BLE strength normal and equal Skin: warm and dry  Neuro:  CNs 2-12 intact, no focal abnormalities noted Psych:  Normal affect   EKG:  The EKG was personally reviewed and demonstrates:  normal sinus rhythm, nonspecific T wave abnormalities. Telemetry:  Telemetry was personally reviewed and demonstrates:  Normal sinus rhythm,  infrequent PVCs  Relevant CV Studies:  Echocardiogram 07/27/22 1. Left ventricular ejection fraction, by estimation, is 25 to 30%. The  left ventricle has severely decreased function. The left ventricle  demonstrates regional wall motion abnormalities (see scoring  diagram/findings for description). The left  ventricular internal cavity size was mildly to moderately dilated. Left  ventricular diastolic parameters are consistent with Grade III diastolic  dysfunction (restrictive). Elevated left ventricular end-diastolic  pressure. There is severe hypokinesis of  the left ventricular, entire inferolateral wall.   2. Right ventricular systolic function is moderately reduced. The right  ventricular size is normal. There is moderately elevated pulmonary artery  systolic pressure. The estimated right ventricular systolic pressure is  58.1 mmHg.   3. Right atrial size was mildly dilated.   4. Large pleural effusion in both left and right lateral regions.   5. The mitral valve is normal in structure. Mild to moderate mitral valve  regurgitation. No evidence of mitral stenosis.   6. The aortic valve is tricuspid. There is mild calcification of the  aortic valve. There is mild thickening of the aortic valve. Aortic valve  regurgitation is mild. Aortic valve sclerosis/calcification is present,  without any evidence of aortic  stenosis. Aortic regurgitation PHT measures 609 msec. Aortic valve mean  gradient measures 4.5 mmHg.   7. The inferior vena cava is normal in size with <50% respiratory  variability, suggesting right atrial pressure of 8 mmHg.   Comparison(s): Prior images unable to be directly viewed, comparison made  by report only. Echo report from [facility] 05/05/21: EF 40-45%, hypokinesis of  inferolateral wall, moderate pulmonary hypertension.   Conclusion(s)/Recommendation(s): Severely reduced LVEF, worse compared to  prior report. Findings communicated to Dr. Jomarie Longs.   Laboratory  Data:  High Sensitivity Troponin:   Recent Labs  Lab 07/26/22 1809 07/26/22 2018  TROPONINIHS 40* 40*     Chemistry Recent Labs  Lab 07/26/22 1844  NA 136  K 3.5  CL 109  CO2 18*  GLUCOSE 109*  BUN 24*  CREATININE 1.81*  CALCIUM 8.7*  GFRNONAA 39*  ANIONGAP 9    No results for  input(s): "PROT", "ALBUMIN", "AST", "ALT", "ALKPHOS", "BILITOT" in the last 168 hours. Lipids No results for input(s): "CHOL", "TRIG", "HDL", "LABVLDL", "LDLCALC", "CHOLHDL" in the last 168 hours.  Hematology Recent Labs  Lab 07/26/22 1809  WBC 7.9  RBC 3.79*  HGB 10.9*  HCT 32.4*  MCV 85.5  MCH 28.8  MCHC 33.6  RDW 16.7*  PLT 240   Thyroid No results for input(s): "TSH", "FREET4" in the last 168 hours.  BNP Recent Labs  Lab 07/26/22 1844  BNP 3,067.7*    DDimer No results for input(s): "DDIMER" in the last 168 hours.   Radiology/Studies:  ECHOCARDIOGRAM COMPLETE  Result Date: 07/27/2022    ECHOCARDIOGRAM REPORT   Patient Name:   Cody Small Date of Exam: 07/27/2022 Medical Rec #:  [id number]   Height:       67.0 in Accession #:    [id number]  Weight:       156.3 lb Date of Birth:  [birth year]    BSA:          1.821 m Patient Age:    76 years    BP:           149/91 mmHg Patient Gender: M           HR:           60 bpm. Exam Location:  Inpatient Procedure: 2D Echo and Intracardiac Opacification Agent Indications:    CHF  History:        Patient has no prior history of Echocardiogram examinations.  Sonographer:    Cathie Hoops Referring Phys: Heywood Iles GARDNER IMPRESSIONS  1. Left ventricular ejection fraction, by estimation, is 25 to 30%. The left ventricle has severely decreased function. The left ventricle demonstrates regional wall motion abnormalities (see scoring diagram/findings for description). The left ventricular internal cavity size was mildly to moderately dilated. Left ventricular diastolic parameters are consistent with Grade III diastolic dysfunction (restrictive). Elevated left  ventricular end-diastolic pressure. There is severe hypokinesis of the left ventricular, entire inferolateral wall.  2. Right ventricular systolic function is moderately reduced. The right ventricular size is normal. There is moderately elevated pulmonary artery systolic pressure. The estimated right ventricular systolic pressure is 58.1 mmHg.  3. Right atrial size was mildly dilated.  4. Large pleural effusion in both left and right lateral regions.  5. The mitral valve is normal in structure. Mild to moderate mitral valve regurgitation. No evidence of mitral stenosis.  6. The aortic valve is tricuspid. There is mild calcification of the aortic valve. There is mild thickening of the aortic valve. Aortic valve regurgitation is mild. Aortic valve sclerosis/calcification is present, without any evidence of aortic stenosis. Aortic regurgitation PHT measures 609 msec. Aortic valve mean gradient measures 4.5 mmHg.  7. The inferior vena cava is normal in size with <50% respiratory variability, suggesting right atrial pressure of 8 mmHg. Comparison(s): Prior images unable to be directly viewed, comparison made by report only. Echo report from [facility] 05/05/21: EF 40-45%, hypokinesis of inferolateral wall, moderate pulmonary hypertension. Conclusion(s)/Recommendation(s): Severely reduced LVEF, worse compared to prior report. Findings communicated to Dr. Jomarie Longs. FINDINGS  Left Ventricle: GLobal hypokinesis with severe hypokinesis of inferolateral wall. Echo contrast shows trabeculation but no evidence of LV thrombus. Left ventricular ejection fraction, by estimation, is 25 to 30%. The left ventricle has severely decreased function. The left ventricle demonstrates regional wall motion abnormalities. Severe hypokinesis of the left ventricular, entire inferolateral wall. Definity contrast agent was given IV to delineate the left  ventricular endocardial borders. The  left ventricular internal cavity size was mildly to moderately  dilated. There is no left ventricular hypertrophy. Left ventricular diastolic parameters are consistent with Grade III diastolic dysfunction (restrictive). Elevated left ventricular end-diastolic pressure. Right Ventricle: The right ventricular size is normal. Right vetricular wall thickness was not well visualized. Right ventricular systolic function is moderately reduced. There is moderately elevated pulmonary artery systolic pressure. The tricuspid regurgitant velocity is 3.54 m/s, and with an assumed right atrial pressure of 8 mmHg, the estimated right ventricular systolic pressure is 58.1 mmHg. Left [facility]: Left atrial size was normal in size. Right [facility]: Right atrial size was mildly dilated. Pericardium: There is no evidence of pericardial effusion. Mitral Valve: The mitral valve is normal in structure. Mild to moderate mitral valve regurgitation. No evidence of mitral valve stenosis. Tricuspid Valve: The tricuspid valve is normal in structure. Tricuspid valve regurgitation is mild . No evidence of tricuspid stenosis. Aortic Valve: The aortic valve is tricuspid. There is mild calcification of the aortic valve. There is mild thickening of the aortic valve. Aortic valve regurgitation is mild. Aortic regurgitation PHT measures 609 msec. Aortic valve sclerosis/calcification is present, without any evidence of aortic stenosis. Aortic valve mean gradient measures 4.5 mmHg. Aortic valve peak gradient measures 9.1 mmHg. Aortic valve area, by VTI measures 1.37 cm. Pulmonic Valve: The pulmonic valve was grossly normal. Pulmonic valve regurgitation is mild. No evidence of pulmonic stenosis. Aorta: The aortic root, ascending aorta, aortic arch and descending aorta are all structurally normal, with no evidence of dilitation or obstruction. Venous: The inferior vena cava is normal in size with less than 50% respiratory variability, suggesting right atrial pressure of 8 mmHg. IAS/Shunts: The atrial septum is grossly  normal. Additional Comments: There is a large pleural effusion in both left and right lateral regions.  LEFT VENTRICLE PLAX 2D LVIDd:         5.60 cm      Diastology LVIDs:         5.00 cm      LV e' medial:    3.38 cm/s LV PW:         0.90 cm      LV E/e' medial:  32.0 LV IVS:        0.90 cm      LV e' lateral:   3.60 cm/s LVOT diam:     2.00 cm      LV E/e' lateral: 30.0 LV SV:         47 LV SV Index:   26 LVOT Area:     3.14 cm  LV Volumes (MOD) LV vol d, MOD A2C: 142.0 ml LV vol d, MOD A4C: 124.5 ml LV vol s, MOD A2C: 102.3 ml LV vol s, MOD A4C: 87.2 ml LV SV MOD A2C:     39.7 ml LV SV MOD A4C:     124.5 ml LV SV MOD BP:      36.9 ml RIGHT VENTRICLE RV Basal diam:  3.60 cm RV Mid diam:    3.40 cm RV S prime:     8.78 cm/s TAPSE (M-mode): 1.1 cm LEFT ATRIUM             Index        RIGHT ATRIUM           Index LA diam:        3.50 cm 1.92 cm/m   RA Area:     14.00 cm LA Vol (A2C):  47.0 ml 25.81 ml/m  RA Volume:   41.10 ml  22.57 ml/m LA Vol (A4C):   41.4 ml 22.73 ml/m LA Biplane Vol: 44.7 ml 24.55 ml/m  AORTIC VALVE                    PULMONIC VALVE AV Area (Vmax):    1.68 cm     PV Vmax:          0.84 m/s AV Area (Vmean):   1.49 cm     PV Peak grad:     2.8 mmHg AV Area (VTI):     1.37 cm     PR End Diast Vel: 14.59 msec AV Vmax:           150.50 cm/s AV Vmean:          98.800 cm/s AV VTI:            0.344 m AV Peak Grad:      9.1 mmHg AV Mean Grad:      4.5 mmHg LVOT Vmax:         80.60 cm/s LVOT Vmean:        47.000 cm/s LVOT VTI:          0.150 m LVOT/AV VTI ratio: 0.44 AI PHT:            609 msec  AORTA Ao Root diam: 3.00 cm Ao Asc diam:  2.70 cm MITRAL VALVE                TRICUSPID VALVE MV Area (PHT): 4.80 cm     TR Peak grad:   50.1 mmHg MV Decel Time: 158 msec     TR Vmax:        354.00 cm/s MR Peak grad: 76.9 mmHg MR Vmax:      438.33 cm/s   SHUNTS MV E velocity: 108.00 cm/s  Systemic VTI:  0.15 m MV A velocity: 27.90 cm/s   Systemic Diam: 2.00 cm MV E/A ratio:  3.87 Jodelle Red  MD Electronically signed by Jodelle Red MD Signature Date/Time: 07/27/2022/3:44:12 PM    Final    DG Chest 2 View  Result Date: 07/26/2022 CLINICAL DATA:  Shortness of breath EXAM: CHEST - 2 VIEW COMPARISON:  Previous studies including the examination of 05/25/2014 FINDINGS: Transverse diameter of heart is slightly increased. Central pulmonary vessels are prominent. There is subtle increase in interstitial markings in parahilar regions and lower lung fields. There is no focal consolidation. There is minimal blunting of right lateral CP angle, possibly suggesting minimal pleural effusion. There is no pneumothorax. IMPRESSION: The subtle increase in interstitial markings in parahilar regions and lower lung fields suggesting mild interstitial edema or interstitial pneumonia. There is no focal pulmonary consolidation. Electronically Signed   By: Ernie Avena M.D.   On: 07/26/2022 16:44     Assessment and Plan:   Acute on Chronic Combined Systolic and Diastolic CHF  - Echocardiogram this admission with EF 25-30% with regional wall motion abnormalities, grade III diastolic dysfunction, moderately reduced RV systolic function  - Prior echo from 04/2021 showed EF 40-45% - PTA, patient was on lisinopril, metoprolol, and spironolactone  - Given reduced EF despite being on GDMT, patient will need repeat ischemic eval. Patient would not be a candidate for Coronary CT as he has a stent. Likely cath om Monday  - Patient presented with BNP elevated to 3067. CXR showed mild interstitial edema vs pna  - He has been given 2  doses of IV lasix 40 mg so far-- output 2.9 L urine in the past 2 days.  - Continue IV lasix 40 mg BID for now. Daily BMP to assess renal function  - Patient's last dose of lisinopril was yesterday AM, plan to start entresto tomorrow if BP and renal function tolerate  - Continue metoprolol 25 mg daily, spironolactone 12. 5 mg daily - Start jardiance prior to DC   CAD  - s/p DES  to Lcx on 04/2020. Patient was on DAPT for 1 year after DES, now just on ASA - Patient denies chest pain  - hsTn 40>40 this admission. Suspect this is demand ischemia in the setting of volume overload - Cath as above for reduced EF  - Continue lipitor, metoprolol, aspirin   CKD stage IIIb  -Baseline creatinine appears to be around 1.6. Elevated to 1.81 on presentation  - Follow renal function closely with diuresis   Risk Assessment/Risk Scores:  [id number]}      New York Heart Association (NYHA) Functional Class NYHA Class III        For questions or updates, please contact [facility] Please consult [email or web address] for contact info under    Signed, Jonita Albee, PA-C  07/27/2022 4:05 PM

## 2022-07-27 NOTE — Assessment & Plan Note (Signed)
CHF pathway Tele monitor 2d echo Diuresing: Got 60mg  IV lasix in ED Will put on lasix 40mg  IV daily for the moment Strict intake and output Daily BMP to monitor renal fxn with diuresis Continue home lisinopril, metoprolol Continue home aldactone

## 2022-07-27 NOTE — Assessment & Plan Note (Signed)
Diet controlled? Doesn't seem to be on any home meds for this. Will just do CBG checks AC/HS for the moment.

## 2022-07-27 NOTE — Assessment & Plan Note (Addendum)
Looks like baseline creat ~1.6 Creat 1.8 today. Monitor daily creat with diuresis.

## 2022-07-27 NOTE — Progress Notes (Signed)
  Echocardiogram 2D Echocardiogram has been performed.  Cody Small 07/27/2022, 12:21 PM

## 2022-07-27 NOTE — Assessment & Plan Note (Signed)
Cont statin

## 2022-07-27 NOTE — Progress Notes (Addendum)
PROGRESS NOTE    Cody Small  FGH:[id number] DOB: 02-Aug-1946 DOA: 07/26/2022 PCP: Charolett Bumpers, PA-C   26 /M w/DM2, HTN, HLD, HFrEF with EF 40-45% CAD s/p stent, CVA, presented to ED with c/o 2 day h/o SOB, DOE, orthopnea. In the ED, BP elevated, BNP >3000, troponin 40, 40. 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 -Monitor daily creat with diuresis.  CAD  -S/P-DES to circumflex on 04/2020  -Cont ASA, statin, BB  HLD (hyperlipidemia) Cont statin.  DM2 (diabetes mellitus, type 2) (HCC) Diet controlled? Doesn't seem to be on any home meds for this. -check hba1c  H/o CVA -Continue aspirin, statin   DVT prophylaxis: lovenox Code Status: Full Code Family Communication: None present, discussed with patient detail Disposition Plan: Home in 24 to 48 hours if improving  Consultants:    Procedures:   Antimicrobials:    Objective: Vitals:   07/27/22 0114 07/27/22 0300 07/27/22 0400 07/27/22 0441  BP: (!) 162/89  (!) 156/83 (!) 120/104  Pulse: 70   66  Resp: 20   19  Temp: 97.8 F (36.6 C)   98.6 F (37 C)  TempSrc: Oral Oral  Oral  SpO2: 97%   97%  Weight: 70.9 kg     Height: 5\' 7"  (1.702 m)       Intake/Output Summary (Last 24 hours) at 07/27/2022 0549 Last data filed at 07/27/2022 0448 Gross per 24 hour  Intake --  Output 1450 ml  Net -1450 ml   Filed  Weights   07/26/22 1610 07/27/22 0114  Weight: 84.8 kg 70.9 kg    Examination:  Thinly built male sitting up in bed, AAOx3 HEENT: Positive JVD CVS: S1-S2, regular rhythm Lungs: Few basilar rales Abdomen: Soft, nontender, bowel sounds present Extremities: No edema   Data Reviewed:   CBC: Recent Labs  Lab 07/26/22 1809  WBC 7.9  NEUTROABS 5.0  HGB 10.9*  HCT 32.4*  MCV 85.5  PLT 240   Basic Metabolic Panel: Recent Labs  Lab 07/26/22 1844  NA 136  K 3.5  CL 109  CO2 18*  GLUCOSE 109*  BUN 24*  CREATININE 1.81*  CALCIUM 8.7*   GFR: Estimated Creatinine Clearance: 33 mL/min (A) (by C-G formula based on SCr of 1.81 mg/dL (H)). Liver Function Tests: No results for input(s): "AST", "ALT", "ALKPHOS", "BILITOT", "PROT", "ALBUMIN" in the last 168 hours. No results for input(s): "LIPASE", "AMYLASE" in the last 168 hours. No results for input(s): "AMMONIA" in the last 168 hours. Coagulation Profile: No results for input(s): "INR", "PROTIME" in the last 168 hours. Cardiac Enzymes: No results for input(s): "CKTOTAL", "CKMB", "CKMBINDEX", "TROPONINI" in the last 168 hours. BNP (last 3 results) No results for input(s): "PROBNP" in the last 8760 hours. HbA1C: No results for input(s): "HGBA1C" in the last 72 hours. CBG: Recent Labs  Lab 07/27/22 0151  GLUCAP 91   Lipid Profile: No results for input(s): "CHOL", "HDL", "LDLCALC", "  TRIG", "CHOLHDL", "LDLDIRECT" in the last 72 hours. Thyroid Function Tests: No results for input(s): "TSH", "T4TOTAL", "FREET4", "T3FREE", "THYROIDAB" in the last 72 hours. Anemia Panel: No results for input(s): "VITAMINB12", "FOLATE", "FERRITIN", "TIBC", "IRON", "RETICCTPCT" in the last 72 hours. Urine analysis: No results found for: "COLORURINE", "APPEARANCEUR", "LABSPEC", "PHURINE", "GLUCOSEU", "HGBUR", "BILIRUBINUR", "KETONESUR", "PROTEINUR", "UROBILINOGEN", "NITRITE", "LEUKOCYTESUR" Sepsis  Labs: @LABRCNTIP (procalcitonin:4,lacticidven:4)  ) Recent Results (from the past 240 hour(s))  Resp Panel by RT-PCR (Flu A&B, Covid) Anterior Nasal Swab     Status: None   Collection Time: 07/26/22  6:09 PM   Specimen: Anterior Nasal Swab  Result Value Ref Range Status   SARS Coronavirus 2 by RT PCR NEGATIVE NEGATIVE Final    Comment: (NOTE) SARS-CoV-2 target nucleic acids are NOT DETECTED.  The SARS-CoV-2 RNA is generally detectable in upper respiratory specimens during the acute phase of infection. The lowest concentration of SARS-CoV-2 viral copies this assay can detect is 138 copies/mL. A negative result does not preclude SARS-Cov-2 infection and should not be used as the sole basis for treatment or other patient management decisions. A negative result may occur with  improper specimen collection/handling, submission of specimen other than nasopharyngeal swab, presence of viral mutation(s) within the areas targeted by this assay, and inadequate number of viral copies(<138 copies/mL). A negative result must be combined with clinical observations, patient history, and epidemiological information. The expected result is Negative.  Fact Sheet for Patients:  EntrepreneurPulse.com.au  Fact Sheet for Healthcare Providers:  IncredibleEmployment.be  This test is no t yet approved or cleared by the [place] FDA and  has been authorized for detection and/or diagnosis of SARS-CoV-2 by FDA under an Emergency Use Authorization (EUA). This EUA will remain  in effect (meaning this test can be used) for the duration of the COVID-19 declaration under Section 564(b)(1) of the Act, 21 U.S.C.section 360bbb-3(b)(1), unless the authorization is terminated  or revoked sooner.       Influenza A by PCR NEGATIVE NEGATIVE Final   Influenza B by PCR NEGATIVE NEGATIVE Final    Comment: (NOTE) The Xpert Xpress SARS-CoV-2/FLU/RSV plus assay is intended as an  aid in the diagnosis of influenza from Nasopharyngeal swab specimens and should not be used as a sole basis for treatment. Nasal washings and aspirates are unacceptable for Xpert Xpress SARS-CoV-2/FLU/RSV testing.  Fact Sheet for Patients: EntrepreneurPulse.com.au  Fact Sheet for Healthcare Providers: IncredibleEmployment.be  This test is not yet approved or cleared by the [place] FDA and has been authorized for detection and/or diagnosis of SARS-CoV-2 by FDA under an Emergency Use Authorization (EUA). This EUA will remain in effect (meaning this test can be used) for the duration of the COVID-19 declaration under Section 564(b)(1) of the Act, 21 U.S.C. section 360bbb-3(b)(1), unless the authorization is terminated or revoked.  Performed at [facility], [address] [place], [place] [address]      Radiology Studies: DG Chest 2 View  Result Date: 07/26/2022 CLINICAL DATA:  Shortness of breath EXAM: CHEST - 2 VIEW COMPARISON:  Previous studies including the examination of 05/25/2014 FINDINGS: Transverse diameter of heart is slightly increased. Central pulmonary vessels are prominent. There is subtle increase in interstitial markings in parahilar regions and lower lung fields. There is no focal consolidation. There is minimal blunting of right lateral CP angle, possibly suggesting minimal pleural effusion. There is no pneumothorax. IMPRESSION: The subtle increase in interstitial markings in parahilar regions and lower lung fields suggesting mild interstitial edema or interstitial  pneumonia. There is no focal pulmonary consolidation. Electronically Signed   By: Elmer Picker M.D.   On: 07/26/2022 16:44     Scheduled Meds:  aspirin EC  81 mg Oral Daily   atorvastatin  80 mg Oral Daily   enoxaparin (LOVENOX) injection  40 mg Subcutaneous Q24H   furosemide  40 mg Intravenous Daily   lisinopril  5 mg Oral Daily   metoprolol  succinate  25 mg Oral Daily   mometasone-formoterol  2 puff Inhalation BID   potassium chloride  10 mEq Oral Q M,W,F   sodium chloride flush  3 mL Intravenous Q12H   spironolactone  12.5 mg Oral Daily   Continuous Infusions:  sodium chloride       LOS: 0 days    Time spent: 62min    Domenic Polite, MD [facility]   07/27/2022, 5:49 AM

## 2022-07-27 NOTE — Progress Notes (Signed)
Heart Failure Nurse Navigator Progress Note  PCP: Charolett Bumpers, PA-C PCP-Cardiologist: None Admission Diagnosis: Acute on chronic congestive heart failure.  Admitted from: Home  Presentation:   Cody Small presented with shortness of breath x several days, has asthma, been using his inhalers. BP 172/92, HR 70, trace of lower extremity edema, BNP 3,067. IV lasix given, CXR suggestive of mild interstitial edema or PNA.   Patient educated on the sign and symptoms of heart failure, daily weights, when to call his doctor or go to the ED, diet/ fluid restrictions, reported patient drinks soda multiple times a week and uses canned goods. Education on taking medications as prescribed, reported from patient that he does miss doses of medications, continue on attending all medical appointments as prescribed, [patient verbalized his understanding, a hospital follow up with HF TOC was scheduled for 08/21/22 @ 11 am.   ECHO/ LVEF: 25-30 % G3DD  Clinical Course:  Past Medical History:  Diagnosis Date   Asthma    DM2 (diabetes mellitus, type 2) (HCC)    HFrEF (heart failure with reduced ejection fraction) (HCC)    HLD (hyperlipidemia)    HTN (hypertension)    MI (myocardial infarction) (HCC)      Social History   Socioeconomic History   Marital status: Married    Spouse name: Not on file   Number of children: Not on file   Years of education: Not on file   Highest education level: Not on file  Occupational History   Not on file  Tobacco Use   Smoking status: Former   Smokeless tobacco: Never  Substance and Sexual Activity   Alcohol use: No   Drug use: Never   Sexual activity: Not on file  Other Topics Concern   Not on file  Social History Narrative   Not on file   Social Determinants of Health   Financial Resource Strain: Not on file  Food Insecurity: No Food Insecurity (07/27/2022)   Hunger Vital Sign    Worried About Running Out of Food in the Last Year: Never true    Ran  Out of Food in the Last Year: Never true  Transportation Needs: No Transportation Needs (07/27/2022)   PRAPARE - Administrator, Civil Service (Medical): No    Lack of Transportation (Non-Medical): No  Physical Activity: Not on file  Stress: Not on file  Social Connections: Not on file   Education Assessment and Provision:  Detailed education and instructions provided on heart failure disease management including the following:  Signs and symptoms of Heart Failure When to call the physician Importance of daily weights Low sodium diet Fluid restriction Medication management Anticipated future follow-up appointments  Patient education given on each of the above topics.  Patient acknowledges understanding via teach back method and acceptance of all instructions.  Education Materials:  "Living Better With Heart Failure" Booklet, HF zone tool, & Daily Weight Tracker Tool.  Patient has scale at home: yes Patient has pill box at home: NA    High Risk Criteria for Readmission and/or Poor Patient Outcomes: Heart failure hospital admissions (last 6 months): 1  No Show rate: 0 Difficult social situation: No Demonstrates medication adherence: No, misses doses Primary Language: English Literacy level: Reading, writing, and comprehension  Barriers of Care:   Diet/ fluids ( Soda, canned foods) Daily weights, Medication compliance  Considerations/Referrals:   Referral made to Heart Failure Pharmacist Stewardship: Yes Referral made to Heart Failure CSW/NCM TOC: No Referral made to  Heart & Vascular TOC clinic: Yes, 08/21/22 @ 11 am  Items for Follow-up on DC/TOC: Diet/ fluids ( soda, canned goods) Medication compliance ( misses doses) Daily weights   Rhae Hammock, BSN, RN Heart Failure Teacher, adult education Only

## 2022-07-28 DIAGNOSIS — Z8673 Personal history of transient ischemic attack (TIA), and cerebral infarction without residual deficits: Secondary | ICD-10-CM | POA: Diagnosis not present

## 2022-07-28 DIAGNOSIS — I2582 Chronic total occlusion of coronary artery: Secondary | ICD-10-CM | POA: Diagnosis not present

## 2022-07-28 DIAGNOSIS — I272 Pulmonary hypertension, unspecified: Secondary | ICD-10-CM | POA: Diagnosis not present

## 2022-07-28 DIAGNOSIS — Z841 Family history of disorders of kidney and ureter: Secondary | ICD-10-CM | POA: Diagnosis not present

## 2022-07-28 DIAGNOSIS — I5023 Acute on chronic systolic (congestive) heart failure: Secondary | ICD-10-CM | POA: Diagnosis not present

## 2022-07-28 DIAGNOSIS — I429 Cardiomyopathy, unspecified: Secondary | ICD-10-CM | POA: Diagnosis not present

## 2022-07-28 DIAGNOSIS — Z79899 Other long term (current) drug therapy: Secondary | ICD-10-CM | POA: Diagnosis not present

## 2022-07-28 DIAGNOSIS — U071 COVID-19: Secondary | ICD-10-CM | POA: Diagnosis not present

## 2022-07-28 DIAGNOSIS — Z800 Family history of malignant neoplasm of digestive organs: Secondary | ICD-10-CM | POA: Diagnosis not present

## 2022-07-28 DIAGNOSIS — J45909 Unspecified asthma, uncomplicated: Secondary | ICD-10-CM | POA: Diagnosis not present

## 2022-07-28 DIAGNOSIS — E782 Mixed hyperlipidemia: Secondary | ICD-10-CM | POA: Diagnosis not present

## 2022-07-28 DIAGNOSIS — I130 Hypertensive heart and chronic kidney disease with heart failure and stage 1 through stage 4 chronic kidney disease, or unspecified chronic kidney disease: Secondary | ICD-10-CM | POA: Diagnosis not present

## 2022-07-28 DIAGNOSIS — Z7951 Long term (current) use of inhaled steroids: Secondary | ICD-10-CM | POA: Diagnosis not present

## 2022-07-28 DIAGNOSIS — I5033 Acute on chronic diastolic (congestive) heart failure: Secondary | ICD-10-CM | POA: Diagnosis present

## 2022-07-28 DIAGNOSIS — Z955 Presence of coronary angioplasty implant and graft: Secondary | ICD-10-CM | POA: Diagnosis not present

## 2022-07-28 DIAGNOSIS — Z87891 Personal history of nicotine dependence: Secondary | ICD-10-CM | POA: Diagnosis not present

## 2022-07-28 DIAGNOSIS — I252 Old myocardial infarction: Secondary | ICD-10-CM | POA: Diagnosis not present

## 2022-07-28 DIAGNOSIS — E1122 Type 2 diabetes mellitus with diabetic chronic kidney disease: Secondary | ICD-10-CM | POA: Diagnosis not present

## 2022-07-28 DIAGNOSIS — E785 Hyperlipidemia, unspecified: Secondary | ICD-10-CM | POA: Diagnosis not present

## 2022-07-28 DIAGNOSIS — I2510 Atherosclerotic heart disease of native coronary artery without angina pectoris: Secondary | ICD-10-CM | POA: Diagnosis not present

## 2022-07-28 DIAGNOSIS — Z1152 Encounter for screening for COVID-19: Secondary | ICD-10-CM | POA: Diagnosis not present

## 2022-07-28 DIAGNOSIS — R54 Age-related physical debility: Secondary | ICD-10-CM | POA: Diagnosis not present

## 2022-07-28 DIAGNOSIS — N1832 Chronic kidney disease, stage 3b: Secondary | ICD-10-CM | POA: Diagnosis not present

## 2022-07-28 DIAGNOSIS — Z7982 Long term (current) use of aspirin: Secondary | ICD-10-CM | POA: Diagnosis not present

## 2022-07-28 LAB — BASIC METABOLIC PANEL
Anion gap: 9 (ref 5–15)
BUN: 26 mg/dL — ABNORMAL HIGH (ref 8–23)
CO2: 24 mmol/L (ref 22–32)
Calcium: 8.9 mg/dL (ref 8.9–10.3)
Chloride: 103 mmol/L (ref 98–111)
Creatinine, Ser: 1.69 mg/dL — ABNORMAL HIGH (ref 0.61–1.24)
GFR, Estimated: 42 mL/min — ABNORMAL LOW (ref >60)
Glucose, Bld: 111 mg/dL — ABNORMAL HIGH (ref 70–99)
Potassium: 3.5 mmol/L (ref 3.5–5.1)
Sodium: 136 mmol/L (ref 135–145)

## 2022-07-28 LAB — GLUCOSE, CAPILLARY
Glucose-Capillary: 95 mg/dL (ref 70–99)
Glucose-Capillary: 106 mg/dL — ABNORMAL HIGH (ref 70–99)
Glucose-Capillary: 127 mg/dL — ABNORMAL HIGH (ref 70–99)
Glucose-Capillary: 131 mg/dL — ABNORMAL HIGH (ref 70–99)

## 2022-07-28 NOTE — Progress Notes (Signed)
CSW acknowledges consult for SNF/HH. The patient will require PT/OT evaluations. TOC will assist with disposition planning once the evaluations have been completed.  °  °TOC will continue to follow.    °

## 2022-07-28 NOTE — Progress Notes (Signed)
Rounding Note    Patient Name: Cody KingfisherJames Henandez Date of Encounter: 07/28/2022  [facility] Cardiologist: None   Subjective   Feeling much better this morning. Volume status significantly improved. Able to lay flat in bed.  Cr stable at 1.7 Net negative 1.9L Wt 145.9>144.1lbs  Inpatient Medications    Scheduled Meds:  aspirin EC  81 mg Oral Daily   atorvastatin  80 mg Oral Daily   enoxaparin (LOVENOX) injection  40 mg Subcutaneous Q24H   furosemide  40 mg Intravenous BID   metoprolol succinate  25 mg Oral Daily   mometasone-formoterol  2 puff Inhalation BID   sodium chloride flush  3 mL Intravenous Q12H   spironolactone  12.5 mg Oral Daily   Continuous Infusions:  sodium chloride     PRN Meds: sodium chloride, acetaminophen, ondansetron (ZOFRAN) IV, sodium chloride flush   Vital Signs    Vitals:   07/28/22 0835 07/28/22 1658 07/28/22 1753 07/28/22 1939  BP: (!) 140/77 120/71 126/73 122/77  Pulse: 67 68    Resp: 14 18  19   Temp: 99.2 F (37.3 C) 98.8 F (37.1 C)  98.9 F (37.2 C)  TempSrc: Oral Oral  Oral  SpO2: 95%     Weight:      Height:        Intake/Output Summary (Last 24 hours) at 07/28/2022 2002 Last data filed at 07/28/2022 1914 Gross per 24 hour  Intake 240 ml  Output 3175 ml  Net -2935 ml       07/28/2022    5:46 AM 07/27/2022    1:14 AM 07/26/2022    4:10 PM  Last 3 Weights  Weight (lbs) 145 lb 15.1 oz 156 lb 4.8 oz 186 lb 15.2 oz  Weight (kg) 66.2 kg 70.897 kg 84.8 kg      Telemetry    NSR - Personally Reviewed  ECG    No new tracing - Personally Reviewed  Physical Exam   GEN: Elderly male, comfortable, NAD  Neck: JVD to mid neck Cardiac: RRR, 2/6 systolic murmur Respiratory: CTAB GI: Soft, nontender, non-distended  MS: No edema, warm Neuro:  Nonfocal  Psych: Normal affect   Labs    High Sensitivity Troponin:   Recent Labs  Lab 07/26/22 1809 07/26/22 2018  TROPONINIHS 40* 40*      Chemistry Recent Labs   Lab 07/26/22 1844 07/28/22 0137  NA 136 136  K 3.5 3.5  CL 109 103  CO2 18* 24  GLUCOSE 109* 111*  BUN 24* 26*  CREATININE 1.81* 1.69*  CALCIUM 8.7* 8.9  GFRNONAA 39* 42*  ANIONGAP 9 9     Lipids No results for input(s): "CHOL", "TRIG", "HDL", "LABVLDL", "LDLCALC", "CHOLHDL" in the last 168 hours.  Hematology Recent Labs  Lab 07/26/22 1809  WBC 7.9  RBC 3.79*  HGB 10.9*  HCT 32.4*  MCV 85.5  MCH 28.8  MCHC 33.6  RDW 16.7*  PLT 240    Thyroid No results for input(s): "TSH", "FREET4" in the last 168 hours.  BNP Recent Labs  Lab 07/26/22 1844  BNP 3,067.7*     DDimer No results for input(s): "DDIMER" in the last 168 hours.   Radiology    ECHOCARDIOGRAM COMPLETE  Result Date: 07/27/2022    ECHOCARDIOGRAM REPORT   Patient Name:   Cody KingfisherJames Small Date of Exam: 07/27/2022 Medical Rec #:  [id number]   Height:       67.0 in Accession #:    [id number]  Weight:  156.3 lb Date of Birth:  1946/08/02    BSA:          1.821 m Patient Age:    76 years    BP:           149/91 mmHg Patient Gender: M           HR:           60 bpm. Exam Location:  Inpatient Procedure: 2D Echo and Intracardiac Opacification Agent Indications:    CHF  History:        Patient has no prior history of Echocardiogram examinations.  Sonographer:    Cathie Hoops Referring Phys: Heywood Iles GARDNER IMPRESSIONS  1. Left ventricular ejection fraction, by estimation, is 25 to 30%. The left ventricle has severely decreased function. The left ventricle demonstrates regional wall motion abnormalities (see scoring diagram/findings for description). The left ventricular internal cavity size was mildly to moderately dilated. Left ventricular diastolic parameters are consistent with Grade III diastolic dysfunction (restrictive). Elevated left ventricular end-diastolic pressure. There is severe hypokinesis of the left ventricular, entire inferolateral wall.  2. Right ventricular systolic function is moderately reduced. The right  ventricular size is normal. There is moderately elevated pulmonary artery systolic pressure. The estimated right ventricular systolic pressure is 58.1 mmHg.  3. Right atrial size was mildly dilated.  4. Large pleural effusion in both left and right lateral regions.  5. The mitral valve is normal in structure. Mild to moderate mitral valve regurgitation. No evidence of mitral stenosis.  6. The aortic valve is tricuspid. There is mild calcification of the aortic valve. There is mild thickening of the aortic valve. Aortic valve regurgitation is mild. Aortic valve sclerosis/calcification is present, without any evidence of aortic stenosis. Aortic regurgitation PHT measures 609 msec. Aortic valve mean gradient measures 4.5 mmHg.  7. The inferior vena cava is normal in size with <50% respiratory variability, suggesting right atrial pressure of 8 mmHg. Comparison(s): Prior images unable to be directly viewed, comparison made by report only. Echo report from [facility] 05/05/21: EF 40-45%, hypokinesis of inferolateral wall, moderate pulmonary hypertension. Conclusion(s)/Recommendation(s): Severely reduced LVEF, worse compared to prior report. Findings communicated to Dr. Jomarie Longs. FINDINGS  Left Ventricle: GLobal hypokinesis with severe hypokinesis of inferolateral wall. Echo contrast shows trabeculation but no evidence of LV thrombus. Left ventricular ejection fraction, by estimation, is 25 to 30%. The left ventricle has severely decreased function. The left ventricle demonstrates regional wall motion abnormalities. Severe hypokinesis of the left ventricular, entire inferolateral wall. Definity contrast agent was given IV to delineate the left ventricular endocardial borders. The  left ventricular internal cavity size was mildly to moderately dilated. There is no left ventricular hypertrophy. Left ventricular diastolic parameters are consistent with Grade III diastolic dysfunction (restrictive). Elevated left ventricular  end-diastolic pressure. Right Ventricle: The right ventricular size is normal. Right vetricular wall thickness was not well visualized. Right ventricular systolic function is moderately reduced. There is moderately elevated pulmonary artery systolic pressure. The tricuspid regurgitant velocity is 3.54 m/s, and with an assumed right atrial pressure of 8 mmHg, the estimated right ventricular systolic pressure is 58.1 mmHg. Left [facility]: Left atrial size was normal in size. Right [facility]: Right atrial size was mildly dilated. Pericardium: There is no evidence of pericardial effusion. Mitral Valve: The mitral valve is normal in structure. Mild to moderate mitral valve regurgitation. No evidence of mitral valve stenosis. Tricuspid Valve: The tricuspid valve is normal in structure. Tricuspid valve regurgitation is mild . No evidence  of tricuspid stenosis. Aortic Valve: The aortic valve is tricuspid. There is mild calcification of the aortic valve. There is mild thickening of the aortic valve. Aortic valve regurgitation is mild. Aortic regurgitation PHT measures 609 msec. Aortic valve sclerosis/calcification is present, without any evidence of aortic stenosis. Aortic valve mean gradient measures 4.5 mmHg. Aortic valve peak gradient measures 9.1 mmHg. Aortic valve area, by VTI measures 1.37 cm. Pulmonic Valve: The pulmonic valve was grossly normal. Pulmonic valve regurgitation is mild. No evidence of pulmonic stenosis. Aorta: The aortic root, ascending aorta, aortic arch and descending aorta are all structurally normal, with no evidence of dilitation or obstruction. Venous: The inferior vena cava is normal in size with less than 50% respiratory variability, suggesting right atrial pressure of 8 mmHg. IAS/Shunts: The atrial septum is grossly normal. Additional Comments: There is a large pleural effusion in both left and right lateral regions.  LEFT VENTRICLE PLAX 2D LVIDd:         5.60 cm      Diastology LVIDs:         5.00  cm      LV e' medial:    3.38 cm/s LV PW:         0.90 cm      LV E/e' medial:  32.0 LV IVS:        0.90 cm      LV e' lateral:   3.60 cm/s LVOT diam:     2.00 cm      LV E/e' lateral: 30.0 LV SV:         47 LV SV Index:   26 LVOT Area:     3.14 cm  LV Volumes (MOD) LV vol d, MOD A2C: 142.0 ml LV vol d, MOD A4C: 124.5 ml LV vol s, MOD A2C: 102.3 ml LV vol s, MOD A4C: 87.2 ml LV SV MOD A2C:     39.7 ml LV SV MOD A4C:     124.5 ml LV SV MOD BP:      36.9 ml RIGHT VENTRICLE RV Basal diam:  3.60 cm RV Mid diam:    3.40 cm RV S prime:     8.78 cm/s TAPSE (M-mode): 1.1 cm LEFT ATRIUM             Index        RIGHT ATRIUM           Index LA diam:        3.50 cm 1.92 cm/m   RA Area:     14.00 cm LA Vol (A2C):   47.0 ml 25.81 ml/m  RA Volume:   41.10 ml  22.57 ml/m LA Vol (A4C):   41.4 ml 22.73 ml/m LA Biplane Vol: 44.7 ml 24.55 ml/m  AORTIC VALVE                    PULMONIC VALVE AV Area (Vmax):    1.68 cm     PV Vmax:          0.84 m/s AV Area (Vmean):   1.49 cm     PV Peak grad:     2.8 mmHg AV Area (VTI):     1.37 cm     PR End Diast Vel: 14.59 msec AV Vmax:           150.50 cm/s AV Vmean:          98.800 cm/s AV VTI:  0.344 m AV Peak Grad:      9.1 mmHg AV Mean Grad:      4.5 mmHg LVOT Vmax:         80.60 cm/s LVOT Vmean:        47.000 cm/s LVOT VTI:          0.150 m LVOT/AV VTI ratio: 0.44 AI PHT:            609 msec  AORTA Ao Root diam: 3.00 cm Ao Asc diam:  2.70 cm MITRAL VALVE                TRICUSPID VALVE MV Area (PHT): 4.80 cm     TR Peak grad:   50.1 mmHg MV Decel Time: 158 msec     TR Vmax:        354.00 cm/s MR Peak grad: 76.9 mmHg MR Vmax:      438.33 cm/s   SHUNTS MV E velocity: 108.00 cm/s  Systemic VTI:  0.15 m MV A velocity: 27.90 cm/s   Systemic Diam: 2.00 cm MV E/A ratio:  3.87 Jodelle Red MD Electronically signed by Jodelle Red MD Signature Date/Time: 07/27/2022/3:44:12 PM    Final     Cardiac Studies   Echocardiogram 07/27/22 1. Left ventricular ejection  fraction, by estimation, is 25 to 30%. The  left ventricle has severely decreased function. The left ventricle  demonstrates regional wall motion abnormalities (see scoring  diagram/findings for description). The left  ventricular internal cavity size was mildly to moderately dilated. Left  ventricular diastolic parameters are consistent with Grade III diastolic  dysfunction (restrictive). Elevated left ventricular end-diastolic  pressure. There is severe hypokinesis of  the left ventricular, entire inferolateral wall.   2. Right ventricular systolic function is moderately reduced. The right  ventricular size is normal. There is moderately elevated pulmonary artery  systolic pressure. The estimated right ventricular systolic pressure is  58.1 mmHg.   3. Right atrial size was mildly dilated.   4. Large pleural effusion in both left and right lateral regions.   5. The mitral valve is normal in structure. Mild to moderate mitral valve  regurgitation. No evidence of mitral stenosis.   6. The aortic valve is tricuspid. There is mild calcification of the  aortic valve. There is mild thickening of the aortic valve. Aortic valve  regurgitation is mild. Aortic valve sclerosis/calcification is present,  without any evidence of aortic  stenosis. Aortic regurgitation PHT measures 609 msec. Aortic valve mean  gradient measures 4.5 mmHg.   7. The inferior vena cava is normal in size with <50% respiratory  variability, suggesting right atrial pressure of 8 mmHg.   Comparison(s): Prior images unable to be directly viewed, comparison made  by report only. Echo report from [facility] 05/05/21: EF 40-45%, hypokinesis of  inferolateral wall, moderate pulmonary hypertension.   Conclusion(s)/Recommendation(s): Severely reduced LVEF, worse compared to  prior report. Findings communicated to Dr. Jomarie Longs.     Patient Profile     76 y.o. male  with a hx of CAD with history of NSTEMI in 04/2020, chronic systolic  heart failure, HLD, type 2 DM, stage IIIa CKD, pulmonary HTN who presented with worsening SOB found to have acute on chronic systolic HF exacerbation with worsening in LVEF from 40-45%>25-30% for which Cardiology was consulted.    Assessment & Plan    #Acute on Chronic Combined Systolic and Diastolic HF: Patient with known history of systolic HF followed [facility]. TTE 04/2021 with LVEF 40-45%,  severe inferolateral hypokinesis, moderate pulmonary HTN. Presented on this admission with worsening SOB with BNP 3067. TTE with LVEF 25-30%, G3DD, severe inferolateral hypokinesis, mild-to-moderate MR, mild AR. Volume status significantly improved. Will transition to oral lasix. Plan for cath tomorrow. -Transition to lasix  PO daily tomorrow  -Continue metop  XL daily -Continue spironolactone 12.5mg  daily -Plan to start entresto following cath as able -Low Na diet -Plan for ischemic evaluation once more euvolemic  #CAD with history of DES to Lcx: #History of NSTEMI: Patient with history of NSTEMI in 04/2020 with 100% Lcx occlusion s/p PCI. Prior TTE 04/2021 40-45% which dropped to 25-30% on TTE on admission. Trop flat in 40s with no acute ischemic changes on ECG. Denies any chest pain. Will plan for ischemic work-up once clinically more euvolemic. -Plan for LHC/RHC tomorrow -Continue ASA  daily -Continue metop  XL daily -Plan to start entresto following cath as able  #CKD Stage IIIB: Baseline Cr 1.6. Was 1.8 on admission now back to 1.7.  -Monitor with diuresis  #HLD: -Continue lipitor  daily -Add lipids on to labs  #DMII: -Management per primary  Plan discussed with the patient and his daughter at length. Amenable to proceed with cath tomorrow.  INFORMED CONSENT: I have reviewed the risks, indications, and alternatives to cardiac catheterization, possible angioplasty, and stenting with the patient. Risks include but are not limited to bleeding, infection, vascular  injury, stroke, myocardial infection, arrhythmia, kidney injury, radiation-related injury in the case of prolonged fluoroscopy use, emergency cardiac surgery, and death. The patient understands the risks of serious complication is 1-2 in 1000 with diagnostic cardiac cath and 1-2% or less with angioplasty/stenting.        For questions or updates, please contact [facility] Please consult [email or web address] for contact info under        Signed, Meriam Sprague, MD  07/28/2022, 8:02 PM

## 2022-07-28 NOTE — Progress Notes (Signed)
PROGRESS NOTE    Cody Small  ZOX:[id number] DOB: 06/22/1946 DOA: 07/26/2022 PCP: Charolett Bumpersoyle, Patricia K, PA-C   [age] /M w/DM2, HTN, HLD, HFrEF with EF 40-45% CAD s/p stent, CVA, presented to ED with c/o 2 day h/o SOB, DOE, orthopnea. In the ED, BP elevated, BNP >3000, troponin 40, 40. Hb 10.9, CXr w/ interstitial edema -Improved with diuretics, echo noted drop in EF down to 25-30% -Cards consulting, plan for LHC/RHC on Monday  Subjective: Feels better, breathing is improving   Assessment and Plan:  Acute on chronic systolic CHF Pulm HTN -known ICM EF 45-50% -Compliant with meds, admits to dietary indiscretions -Repeat echo with EF down to 25-30%, grade 3 DD, severe inferolateral hypokinesis -Continue IV lasix today, he is 3.4 L negative , continue metop, aldactone, hold lisinoprol -Consider SGLT2i if kidney function continues to improve -Appreciate cards input, plan for RHC/LHC on Monday  Stage 3b chronic kidney disease (CKD) (HCC) Looks like baseline creat ~1.6 -Now improving, back to baseline  CAD  -S/P-DES to circumflex on 04/2020  -Cont ASA, statin, BB  HLD (hyperlipidemia) Cont statin.  DM2 (diabetes mellitus, type 2) (HCC) -Diet controlled, A1c was 6.3 in November  H/o CVA -Continue aspirin, statin   DVT prophylaxis: lovenox Code Status: Full Code Family Communication: None present, discussed with patient detail Disposition Plan: Home on Monday or Tuesday  Consultants: Cards   Procedures:   Antimicrobials:    Objective: Vitals:   07/27/22 2029 07/28/22 0546 07/28/22 0816 07/28/22 0835  BP:  136/85  (!) 140/77  Pulse:  60  67  Resp:  19  14  Temp:  98.2 F (36.8 C)  99.2 F (37.3 C)  TempSrc:  Oral  Oral  SpO2: 98% 95% 95% 95%  Weight:  66.2 kg    Height:        Intake/Output Summary (Last 24 hours) at 07/28/2022 1133 Last data filed at 07/28/2022 1040 Gross per 24 hour  Intake 240 ml  Output 3400 ml  Net -3160 ml   Filed Weights   07/26/22  1610 07/27/22 0114 07/28/22 0546  Weight: 84.8 kg 70.9 kg 66.2 kg    Examination:  Thinly built male sitting up in bed, AAOx3 HEENT: Positive JVD CVS: S1-S2, regular rhythm Lungs: Few basilar rales Abdomen: Soft, nontender, bowel sounds present Extremities: No edema   Data Reviewed:   CBC: Recent Labs  Lab 07/26/22 1809  WBC 7.9  NEUTROABS 5.0  HGB 10.9*  HCT 32.4*  MCV 85.5  PLT 240   Basic Metabolic Panel: Recent Labs  Lab 07/26/22 1844 07/28/22 0137  NA 136 136  K 3.5 3.5  CL 109 103  CO2 18* 24  GLUCOSE 109* 111*  BUN 24* 26*  CREATININE 1.81* 1.69*  CALCIUM 8.7* 8.9   GFR: Estimated Creatinine Clearance: 35.3 mL/min (A) (by C-G formula based on SCr of 1.69 mg/dL (H)). Liver Function Tests: No results for input(s): "AST", "ALT", "ALKPHOS", "BILITOT", "PROT", "ALBUMIN" in the last 168 hours. No results for input(s): "LIPASE", "AMYLASE" in the last 168 hours. No results for input(s): "AMMONIA" in the last 168 hours. Coagulation Profile: No results for input(s): "INR", "PROTIME" in the last 168 hours. Cardiac Enzymes: No results for input(s): "CKTOTAL", "CKMB", "CKMBINDEX", "TROPONINI" in the last 168 hours. BNP (last 3 results) No results for input(s): "PROBNP" in the last 8760 hours. HbA1C: Recent Labs    07/27/22 1054  HGBA1C 6.6*   CBG: Recent Labs  Lab 07/27/22 0719 07/27/22 1152 07/27/22 1643  07/27/22 2122 07/28/22 0754  GLUCAP 105* 110* 100* 170* 95   Lipid Profile: No results for input(s): "CHOL", "HDL", "LDLCALC", "TRIG", "CHOLHDL", "LDLDIRECT" in the last 72 hours. Thyroid Function Tests: No results for input(s): "TSH", "T4TOTAL", "FREET4", "T3FREE", "THYROIDAB" in the last 72 hours. Anemia Panel: No results for input(s): "VITAMINB12", "FOLATE", "FERRITIN", "TIBC", "IRON", "RETICCTPCT" in the last 72 hours. Urine analysis: No results found for: "COLORURINE", "APPEARANCEUR", "LABSPEC", "PHURINE", "GLUCOSEU", "HGBUR", "BILIRUBINUR",  "KETONESUR", "PROTEINUR", "UROBILINOGEN", "NITRITE", "LEUKOCYTESUR" Sepsis Labs: @LABRCNTIP (procalcitonin:4,lacticidven:4)  ) Recent Results (from the past 240 hour(s))  Resp Panel by RT-PCR (Flu A&B, Covid) Anterior Nasal Swab     Status: None   Collection Time: 07/26/22  6:09 PM   Specimen: Anterior Nasal Swab  Result Value Ref Range Status   SARS Coronavirus 2 by RT PCR NEGATIVE NEGATIVE Final    Comment: (NOTE) SARS-CoV-2 target nucleic acids are NOT DETECTED.  The SARS-CoV-2 RNA is generally detectable in upper respiratory specimens during the acute phase of infection. The lowest concentration of SARS-CoV-2 viral copies this assay can detect is 138 copies/mL. A negative result does not preclude SARS-Cov-2 infection and should not be used as the sole basis for treatment or other patient management decisions. A negative result may occur with  improper specimen collection/handling, submission of specimen other than nasopharyngeal swab, presence of viral mutation(s) within the areas targeted by this assay, and inadequate number of viral copies(<138 copies/mL). A negative result must be combined with clinical observations, patient history, and epidemiological information. The expected result is Negative.  Fact Sheet for Patients:  14/07/23  Fact Sheet for Healthcare Providers:  BloggerCourse.com  This test is no t yet approved or cleared by the [employer] FDA and  has been authorized for detection and/or diagnosis of SARS-CoV-2 by FDA under an Emergency Use Authorization (EUA). This EUA will remain  in effect (meaning this test can be used) for the duration of the COVID-19 declaration under Section 564(b)(1) of the Act, 21 U.S.C.section 360bbb-3(b)(1), unless the authorization is terminated  or revoked sooner.       Influenza A by PCR NEGATIVE NEGATIVE Final   Influenza B by PCR NEGATIVE NEGATIVE Final     Comment: (NOTE) The Xpert Xpress SARS-CoV-2/FLU/RSV plus assay is intended as an aid in the diagnosis of influenza from Nasopharyngeal swab specimens and should not be used as a sole basis for treatment. Nasal washings and aspirates are unacceptable for Xpert Xpress SARS-CoV-2/FLU/RSV testing.  Fact Sheet for Patients: [place]  Fact Sheet for Healthcare Providers: BloggerCourse.com  This test is not yet approved or cleared by the [employer] FDA and has been authorized for detection and/or diagnosis of SARS-CoV-2 by FDA under an Emergency Use Authorization (EUA). This EUA will remain in effect (meaning this test can be used) for the duration of the COVID-19 declaration under Section 564(b)(1) of the Act, 21 U.S.C. section 360bbb-3(b)(1), unless the authorization is terminated or revoked.  Performed at [facility], [address], [place], [place]      Radiology Studies: ECHOCARDIOGRAM COMPLETE  Result Date: 07/27/2022    ECHOCARDIOGRAM REPORT   Patient Name:   Cody Small Date of Exam: 07/27/2022 Medical Rec #:  14/03/2022   Height:       67.0 in Accession #:    [id number]  Weight:       156.3 lb Date of Birth:  1946-03-02    BSA:          1.821 m Patient  Age:    76 years    BP:           149/91 mmHg Patient Gender: M           HR:           60 bpm. Exam Location:  Inpatient Procedure: 2D Echo and Intracardiac Opacification Agent Indications:    CHF  History:        Patient has no prior history of Echocardiogram examinations.  Sonographer:    Cathie Hoops Referring Phys: Heywood Iles [place] IMPRESSIONS  1. Left ventricular ejection fraction, by estimation, is 25 to 30%. The left ventricle has severely decreased function. The left ventricle demonstrates regional wall motion abnormalities (see scoring diagram/findings for description). The left ventricular internal cavity size was mildly to moderately dilated. Left  ventricular diastolic parameters are consistent with Grade III diastolic dysfunction (restrictive). Elevated left ventricular end-diastolic pressure. There is severe hypokinesis of the left ventricular, entire inferolateral wall.  2. Right ventricular systolic function is moderately reduced. The right ventricular size is normal. There is moderately elevated pulmonary artery systolic pressure. The estimated right ventricular systolic pressure is 58.1 mmHg.  3. Right atrial size was mildly dilated.  4. Large pleural effusion in both left and right lateral regions.  5. The mitral valve is normal in structure. Mild to moderate mitral valve regurgitation. No evidence of mitral stenosis.  6. The aortic valve is tricuspid. There is mild calcification of the aortic valve. There is mild thickening of the aortic valve. Aortic valve regurgitation is mild. Aortic valve sclerosis/calcification is present, without any evidence of aortic stenosis. Aortic regurgitation PHT measures 609 msec. Aortic valve mean gradient measures 4.5 mmHg.  7. The inferior vena cava is normal in size with <50% respiratory variability, suggesting right atrial pressure of 8 mmHg. Comparison(s): Prior images unable to be directly viewed, comparison made by report only. Echo report from [facility] 05/05/21: EF 40-45%, hypokinesis of inferolateral wall, moderate pulmonary hypertension. Conclusion(s)/Recommendation(s): Severely reduced LVEF, worse compared to prior report. Findings communicated to Dr. Jomarie Longs. FINDINGS  Left Ventricle: GLobal hypokinesis with severe hypokinesis of inferolateral wall. Echo contrast shows trabeculation but no evidence of LV thrombus. Left ventricular ejection fraction, by estimation, is 25 to 30%. The left ventricle has severely decreased function. The left ventricle demonstrates regional wall motion abnormalities. Severe hypokinesis of the left ventricular, entire inferolateral wall. Definity contrast agent was given IV to  delineate the left ventricular endocardial borders. The  left ventricular internal cavity size was mildly to moderately dilated. There is no left ventricular hypertrophy. Left ventricular diastolic parameters are consistent with Grade III diastolic dysfunction (restrictive). Elevated left ventricular end-diastolic pressure. Right Ventricle: The right ventricular size is normal. Right vetricular wall thickness was not well visualized. Right ventricular systolic function is moderately reduced. There is moderately elevated pulmonary artery systolic pressure. The tricuspid regurgitant velocity is 3.54 m/s, and with an assumed right atrial pressure of 8 mmHg, the estimated right ventricular systolic pressure is 58.1 mmHg. Left [facility]: Left atrial size was normal in size. Right [facility]: Right atrial size was mildly dilated. Pericardium: There is no evidence of pericardial effusion. Mitral Valve: The mitral valve is normal in structure. Mild to moderate mitral valve regurgitation. No evidence of mitral valve stenosis. Tricuspid Valve: The tricuspid valve is normal in structure. Tricuspid valve regurgitation is mild . No evidence of tricuspid stenosis. Aortic Valve: The aortic valve is tricuspid. There is mild calcification of the aortic valve. There is mild thickening of  the aortic valve. Aortic valve regurgitation is mild. Aortic regurgitation PHT measures 609 msec. Aortic valve sclerosis/calcification is present, without any evidence of aortic stenosis. Aortic valve mean gradient measures 4.5 mmHg. Aortic valve peak gradient measures 9.1 mmHg. Aortic valve area, by VTI measures 1.37 cm. Pulmonic Valve: The pulmonic valve was grossly normal. Pulmonic valve regurgitation is mild. No evidence of pulmonic stenosis. Aorta: The aortic root, ascending aorta, aortic arch and descending aorta are all structurally normal, with no evidence of dilitation or obstruction. Venous: The inferior vena cava is normal in size with less than  50% respiratory variability, suggesting right atrial pressure of 8 mmHg. IAS/Shunts: The atrial septum is grossly normal. Additional Comments: There is a large pleural effusion in both left and right lateral regions.  LEFT VENTRICLE PLAX 2D LVIDd:         5.60 cm      Diastology LVIDs:         5.00 cm      LV e' medial:    3.38 cm/s LV PW:         0.90 cm      LV E/e' medial:  32.0 LV IVS:        0.90 cm      LV e' lateral:   3.60 cm/s LVOT diam:     2.00 cm      LV E/e' lateral: 30.0 LV SV:         47 LV SV Index:   26 LVOT Area:     3.14 cm  LV Volumes (MOD) LV vol d, MOD A2C: 142.0 ml LV vol d, MOD A4C: 124.5 ml LV vol s, MOD A2C: 102.3 ml LV vol s, MOD A4C: 87.2 ml LV SV MOD A2C:     39.7 ml LV SV MOD A4C:     124.5 ml LV SV MOD BP:      36.9 ml RIGHT VENTRICLE RV Basal diam:  3.60 cm RV Mid diam:    3.40 cm RV S prime:     8.78 cm/s TAPSE (M-mode): 1.1 cm LEFT ATRIUM             Index        RIGHT ATRIUM           Index LA diam:        3.50 cm 1.92 cm/m   RA Area:     14.00 cm LA Vol (A2C):   47.0 ml 25.81 ml/m  RA Volume:   41.10 ml  22.57 ml/m LA Vol (A4C):   41.4 ml 22.73 ml/m LA Biplane Vol: 44.7 ml 24.55 ml/m  AORTIC VALVE                    PULMONIC VALVE AV Area (Vmax):    1.68 cm     PV Vmax:          0.84 m/s AV Area (Vmean):   1.49 cm     PV Peak grad:     2.8 mmHg AV Area (VTI):     1.37 cm     PR End Diast Vel: 14.59 msec AV Vmax:           150.50 cm/s AV Vmean:          98.800 cm/s AV VTI:            0.344 m AV Peak Grad:      9.1 mmHg AV Mean Grad:      4.5 mmHg  LVOT Vmax:         80.60 cm/s LVOT Vmean:        47.000 cm/s LVOT VTI:          0.150 m LVOT/AV VTI ratio: 0.44 AI PHT:            609 msec  AORTA Ao Root diam: 3.00 cm Ao Asc diam:  2.70 cm MITRAL VALVE                TRICUSPID VALVE MV Area (PHT): 4.80 cm     TR Peak grad:   50.1 mmHg MV Decel Time: 158 msec     TR Vmax:        354.00 cm/s MR Peak grad: 76.9 mmHg MR Vmax:      438.33 cm/s   SHUNTS MV E velocity: 108.00 cm/s   Systemic VTI:  0.15 m MV A velocity: 27.90 cm/s   Systemic Diam: 2.00 cm MV E/A ratio:  3.87 Jodelle Red MD Electronically signed by Jodelle Red MD Signature Date/Time: 07/27/2022/3:44:12 PM    Final    DG Chest 2 View  Result Date: 07/26/2022 CLINICAL DATA:  Shortness of breath EXAM: CHEST - 2 VIEW COMPARISON:  Previous studies including the examination of 05/25/2014 FINDINGS: Transverse diameter of heart is slightly increased. Central pulmonary vessels are prominent. There is subtle increase in interstitial markings in parahilar regions and lower lung fields. There is no focal consolidation. There is minimal blunting of right lateral CP angle, possibly suggesting minimal pleural effusion. There is no pneumothorax. IMPRESSION: The subtle increase in interstitial markings in parahilar regions and lower lung fields suggesting mild interstitial edema or interstitial pneumonia. There is no focal pulmonary consolidation. Electronically Signed   By: Ernie Avena M.D.   On: 07/26/2022 16:44     Scheduled Meds:  aspirin EC  81 mg Oral Daily   atorvastatin  80 mg Oral Daily   enoxaparin (LOVENOX) injection  40 mg Subcutaneous Q24H   furosemide  40 mg Intravenous BID   metoprolol succinate  25 mg Oral Daily   mometasone-formoterol  2 puff Inhalation BID   sodium chloride flush  3 mL Intravenous Q12H   spironolactone  12.5 mg Oral Daily   Continuous Infusions:  sodium chloride       LOS: 0 days    Time spent:    Zannie Cove, MD [facility]   07/28/2022, 11:33 AM

## 2022-07-28 NOTE — Care Management Obs Status (Signed)
MEDICARE OBSERVATION STATUS NOTIFICATION   Patient Details  Name: Cody Small MRN: [id number] Date of Birth: 10/18/1945   Medicare Observation Status Notification Given:  Yes    Fatiha Guzy G., RN 07/28/2022, 9:47 AM

## 2022-07-28 NOTE — Plan of Care (Signed)
  Problem: Education: Goal: Knowledge of General Education information will improve Description: Including pain rating scale, medication(s)/side effects and non-pharmacologic comfort measures Outcome: Progressing   Problem: Health Behavior/Discharge Planning: Goal: Ability to manage health-related needs will improve Outcome: Progressing   Problem: Clinical Measurements: Goal: Respiratory complications will improve Outcome: Progressing   Problem: Clinical Measurements: Goal: Cardiovascular complication will be avoided Outcome: Progressing   Problem: Pain Managment: Goal: General experience of comfort will improve Outcome: Progressing   Problem: Safety: Goal: Ability to remain free from injury will improve Outcome: Progressing   Problem: Skin Integrity: Goal: Risk for impaired skin integrity will decrease Outcome: Progressing   Problem: Cardiac: Goal: Ability to achieve and maintain adequate cardiopulmonary perfusion will improve Outcome: Progressing

## 2022-07-29 DIAGNOSIS — I2510 Atherosclerotic heart disease of native coronary artery without angina pectoris: Secondary | ICD-10-CM | POA: Diagnosis not present

## 2022-07-29 DIAGNOSIS — I5023 Acute on chronic systolic (congestive) heart failure: Secondary | ICD-10-CM | POA: Diagnosis not present

## 2022-07-29 DIAGNOSIS — E782 Mixed hyperlipidemia: Secondary | ICD-10-CM | POA: Diagnosis not present

## 2022-07-29 DIAGNOSIS — N1832 Chronic kidney disease, stage 3b: Secondary | ICD-10-CM | POA: Diagnosis not present

## 2022-07-29 LAB — CBC
HCT: 39.7 % (ref 39.0–52.0)
Hemoglobin: 13.2 g/dL (ref 13.0–17.0)
MCH: 28.4 pg (ref 26.0–34.0)
MCHC: 33.2 g/dL (ref 30.0–36.0)
MCV: 85.6 fL (ref 80.0–100.0)
Platelets: 273 10*3/uL (ref 150–400)
RBC: 4.64 MIL/uL (ref 4.22–5.81)
RDW: 16.5 % — ABNORMAL HIGH (ref 11.5–15.5)
WBC: 10.4 10*3/uL (ref 4.0–10.5)
nRBC: 0 % (ref 0.0–0.2)

## 2022-07-29 LAB — LIPID PANEL
Cholesterol: 127 mg/dL (ref 0–200)
HDL: 40 mg/dL — ABNORMAL LOW (ref >40)
LDL Cholesterol: 72 mg/dL (ref 0–99)
Total CHOL/HDL Ratio: 3.2 RATIO
Triglycerides: 77 mg/dL (ref <150)
VLDL: 15 mg/dL (ref 0–40)

## 2022-07-29 LAB — BASIC METABOLIC PANEL
Anion gap: 11 (ref 5–15)
BUN: 35 mg/dL — ABNORMAL HIGH (ref 8–23)
CO2: 24 mmol/L (ref 22–32)
Calcium: 8.8 mg/dL — ABNORMAL LOW (ref 8.9–10.3)
Chloride: 99 mmol/L (ref 98–111)
Creatinine, Ser: 1.73 mg/dL — ABNORMAL HIGH (ref 0.61–1.24)
GFR, Estimated: 41 mL/min — ABNORMAL LOW (ref >60)
Glucose, Bld: 129 mg/dL — ABNORMAL HIGH (ref 70–99)
Potassium: 3.5 mmol/L (ref 3.5–5.1)
Sodium: 134 mmol/L — ABNORMAL LOW (ref 135–145)

## 2022-07-29 LAB — GLUCOSE, CAPILLARY
Glucose-Capillary: 112 mg/dL — ABNORMAL HIGH (ref 70–99)
Glucose-Capillary: 109 mg/dL — ABNORMAL HIGH (ref 70–99)
Glucose-Capillary: 107 mg/dL — ABNORMAL HIGH (ref 70–99)
Glucose-Capillary: 108 mg/dL — ABNORMAL HIGH (ref 70–99)

## 2022-07-29 LAB — SARS CORONAVIRUS 2 BY RT PCR: SARS Coronavirus 2 by RT PCR: POSITIVE — AB

## 2022-07-29 MED ORDER — SODIUM CHLORIDE 0.9% FLUSH: 3.0000 mL | INTRAVENOUS | Status: DC | PRN

## 2022-07-29 MED ORDER — FUROSEMIDE 40 MG PO TABS
40.0000 mg | ORAL_TABLET | Freq: Every day | ORAL | Status: DC
  Administered 2022-07-30 – 2022-08-01 (×3): 40 mg via ORAL
  Filled 2022-07-29 (×3): qty 1

## 2022-07-29 MED ORDER — DOCUSATE SODIUM 100 MG PO CAPS
200.0000 mg | ORAL_CAPSULE | Freq: Every day | ORAL | Status: DC
  Administered 2022-07-29: 200 mg via ORAL
  Filled 2022-07-29: qty 2

## 2022-07-29 MED ORDER — SODIUM CHLORIDE 0.9% FLUSH
3.0000 mL | Freq: Two times a day (BID) | INTRAVENOUS | Status: DC
  Administered 2022-07-30: 3 mL via INTRAVENOUS

## 2022-07-29 MED ORDER — SODIUM CHLORIDE 0.9 % IV SOLN: 250.0000 mL | INTRAVENOUS | Status: DC | PRN

## 2022-07-29 MED ORDER — SODIUM CHLORIDE 0.9 % IV SOLN: INTRAVENOUS | Status: DC

## 2022-07-29 NOTE — Progress Notes (Signed)
PROGRESS NOTE    Cody KingfisherJames Small  ZOX:[id number] DOB: 12/29/1945 DOA: 07/26/2022 PCP: Charolett Bumpersoyle, Patricia K, PA-C   [age] /M w/DM2, HTN, HLD, HFrEF with EF 40-45% CAD s/p stent, CVA, presented to ED with c/o 2 day h/o SOB, DOE, orthopnea. In the ED, BP elevated, BNP >3000, troponin 40, 40. Hb 10.9, CXr w/ interstitial edema -Improved with diuretics, echo noted drop in EF down to 25-30% -Cards consulting, plan for LHC/RHC on Monday  Subjective: Feels better, breathing is improving   Assessment and Plan:  Acute on chronic systolic CHF Pulm HTN -known ICM EF 45-50% -Compliant with meds, admits to dietary indiscretions -Repeat echo with EF down to 25-30%, grade 3 DD, severe inferolateral hypokinesis -Diuresed with IV Lasix, he is 5.3 L negative, clinically appears euvolemic, hold further diuretics till cath  -Continue metoprolol, Aldactone -Appreciate cards input, plan for RHC/LHC on Monday  Stage 3b chronic kidney disease (CKD) (HCC) Looks like baseline creat ~1.6 -Now improving, close to baseline -Holding further diuretics today  CAD  -S/P-DES to circumflex on 04/2020  -Cont ASA, statin, BB  HLD (hyperlipidemia) Cont statin.  DM2 (diabetes mellitus, type 2) (HCC) -Diet controlled, A1c was 6.3 in November  H/o CVA -Continue aspirin, statin   DVT prophylaxis: lovenox Code Status: Full Code Family Communication: None present, discussed with patient detail Disposition Plan: Home on Monday or Tuesday  Consultants: Cards   Procedures:   Antimicrobials:    Objective: Vitals:   07/28/22 1939 07/28/22 2053 07/29/22 0425 07/29/22 0922  BP: 122/77  127/76 121/68  Pulse:  64 82 72  Resp: 19 17 16    Temp: 98.9 F (37.2 C)  98.9 F (37.2 C)   TempSrc: Oral  Oral   SpO2: 96% 96% 97%   Weight:   65.4 kg   Height:        Intake/Output Summary (Last 24 hours) at 07/29/2022 1122 Last data filed at 07/29/2022 0959 Gross per 24 hour  Intake 540 ml  Output 1575 ml  Net -1035 ml    Filed Weights   07/27/22 0114 07/28/22 0546 07/29/22 0425  Weight: 70.9 kg 66.2 kg 65.4 kg    Examination:  Pleasant thinly built male sitting up in bed, AAOx3, no distress HEENT: No JVD CVS: S1-S2, regular rhythm Lungs: Improved air movement bilaterally Abdomen: Soft, nontender, bowel sounds present Extremities: No edema   Data Reviewed:   CBC: Recent Labs  Lab 07/26/22 1809 07/29/22 0147  WBC 7.9 10.4  NEUTROABS 5.0  --   HGB 10.9* 13.2  HCT 32.4* 39.7  MCV 85.5 85.6  PLT 240 273   Basic Metabolic Panel: Recent Labs  Lab 07/26/22 1844 07/28/22 0137 07/29/22 0147  NA 136 136 134*  K 3.5 3.5 3.5  CL 109 103 99  CO2 18* 24 24  GLUCOSE 109* 111* 129*  BUN 24* 26* 35*  CREATININE 1.81* 1.69* 1.73*  CALCIUM 8.7* 8.9 8.8*   GFR: Estimated Creatinine Clearance: 34.1 mL/min (A) (by C-G formula based on SCr of 1.73 mg/dL (H)). Liver Function Tests: No results for input(s): "AST", "ALT", "ALKPHOS", "BILITOT", "PROT", "ALBUMIN" in the last 168 hours. No results for input(s): "LIPASE", "AMYLASE" in the last 168 hours. No results for input(s): "AMMONIA" in the last 168 hours. Coagulation Profile: No results for input(s): "INR", "PROTIME" in the last 168 hours. Cardiac Enzymes: No results for input(s): "CKTOTAL", "CKMB", "CKMBINDEX", "TROPONINI" in the last 168 hours. BNP (last 3 results) No results for input(s): "PROBNP" in the last 8760  hours. HbA1C: Recent Labs    07/27/22 1054  HGBA1C 6.6*   CBG: Recent Labs  Lab 07/28/22 0754 07/28/22 1152 07/28/22 1700 07/28/22 2116 07/29/22 0757  GLUCAP 95 106* 127* 131* 112*   Lipid Profile: Recent Labs    07/29/22 0147  CHOL 127  HDL 40*  LDLCALC 72  TRIG 77  CHOLHDL 3.2   Thyroid Function Tests: No results for input(s): "TSH", "T4TOTAL", "FREET4", "T3FREE", "THYROIDAB" in the last 72 hours. Anemia Panel: No results for input(s): "VITAMINB12", "FOLATE", "FERRITIN", "TIBC", "IRON", "RETICCTPCT" in the  last 72 hours. Urine analysis: No results found for: "COLORURINE", "APPEARANCEUR", "LABSPEC", "PHURINE", "GLUCOSEU", "HGBUR", "BILIRUBINUR", "KETONESUR", "PROTEINUR", "UROBILINOGEN", "NITRITE", "LEUKOCYTESUR" Sepsis Labs: @LABRCNTIP (procalcitonin:4,lacticidven:4)  ) Recent Results (from the past 240 hour(s))  Resp Panel by RT-PCR (Flu A&B, Covid) Anterior Nasal Swab     Status: None   Collection Time: 07/26/22  6:09 PM   Specimen: Anterior Nasal Swab  Result Value Ref Range Status   SARS Coronavirus 2 by RT PCR NEGATIVE NEGATIVE Final    Comment: (NOTE) SARS-CoV-2 target nucleic acids are NOT DETECTED.  The SARS-CoV-2 RNA is generally detectable in upper respiratory specimens during the acute phase of infection. The lowest concentration of SARS-CoV-2 viral copies this assay can detect is 138 copies/mL. A negative result does not preclude SARS-Cov-2 infection and should not be used as the sole basis for treatment or other patient management decisions. A negative result may occur with  improper specimen collection/handling, submission of specimen other than nasopharyngeal swab, presence of viral mutation(s) within the areas targeted by this assay, and inadequate number of viral copies(<138 copies/mL). A negative result must be combined with clinical observations, patient history, and epidemiological information. The expected result is Negative.  Fact Sheet for Patients:  14/07/23  Fact Sheet for Healthcare Providers:  BloggerCourse.com  This test is no t yet approved or cleared by the [employer] FDA and  has been authorized for detection and/or diagnosis of SARS-CoV-2 by FDA under an Emergency Use Authorization (EUA). This EUA will remain  in effect (meaning this test can be used) for the duration of the COVID-19 declaration under Section 564(b)(1) of the Act, 21 U.S.C.section 360bbb-3(b)(1), unless the authorization is  terminated  or revoked sooner.       Influenza A by PCR NEGATIVE NEGATIVE Final   Influenza B by PCR NEGATIVE NEGATIVE Final    Comment: (NOTE) The Xpert Xpress SARS-CoV-2/FLU/RSV plus assay is intended as an aid in the diagnosis of influenza from Nasopharyngeal swab specimens and should not be used as a sole basis for treatment. Nasal washings and aspirates are unacceptable for Xpert Xpress SARS-CoV-2/FLU/RSV testing.  Fact Sheet for Patients: [place]  Fact Sheet for Healthcare Providers: BloggerCourse.com  This test is not yet approved or cleared by the [employer].[place] FDA and has been authorized for detection and/or diagnosis of SARS-CoV-2 by FDA under an Emergency Use Authorization (EUA). This EUA will remain in effect (meaning this test can be used) for the duration of the COVID-19 declaration under Section 564(b)(1) of the Act, 21 U.S.C. section 360bbb-3(b)(1), unless the authorization is terminated or revoked.  Performed at [facility], [address], [place], [place]      Radiology Studies: ECHOCARDIOGRAM COMPLETE  Result Date: 07/27/2022    ECHOCARDIOGRAM REPORT   Patient Name:   Cody Small Date of Exam: 07/27/2022 Medical Rec #:  [id number]   Height:       67.0 in Accession #:  [id number]  Weight:       156.3 lb Date of Birth:  07-12-1946    BSA:          1.821 m Patient Age:    76 years    BP:           149/91 mmHg Patient Gender: M           HR:           60 bpm. Exam Location:  Inpatient Procedure: 2D Echo and Intracardiac Opacification Agent Indications:    CHF  History:        Patient has no prior history of Echocardiogram examinations.  Sonographer:    Cathie Hoops Referring Phys: Heywood Iles GARDNER IMPRESSIONS  1. Left ventricular ejection fraction, by estimation, is 25 to 30%. The left ventricle has severely decreased function. The left ventricle demonstrates regional wall motion  abnormalities (see scoring diagram/findings for description). The left ventricular internal cavity size was mildly to moderately dilated. Left ventricular diastolic parameters are consistent with Grade III diastolic dysfunction (restrictive). Elevated left ventricular end-diastolic pressure. There is severe hypokinesis of the left ventricular, entire inferolateral wall.  2. Right ventricular systolic function is moderately reduced. The right ventricular size is normal. There is moderately elevated pulmonary artery systolic pressure. The estimated right ventricular systolic pressure is 58.1 mmHg.  3. Right atrial size was mildly dilated.  4. Large pleural effusion in both left and right lateral regions.  5. The mitral valve is normal in structure. Mild to moderate mitral valve regurgitation. No evidence of mitral stenosis.  6. The aortic valve is tricuspid. There is mild calcification of the aortic valve. There is mild thickening of the aortic valve. Aortic valve regurgitation is mild. Aortic valve sclerosis/calcification is present, without any evidence of aortic stenosis. Aortic regurgitation PHT measures 609 msec. Aortic valve mean gradient measures 4.5 mmHg.  7. The inferior vena cava is normal in size with <50% respiratory variability, suggesting right atrial pressure of 8 mmHg. Comparison(s): Prior images unable to be directly viewed, comparison made by report only. Echo report from [facility] 05/05/21: EF 40-45%, hypokinesis of inferolateral wall, moderate pulmonary hypertension. Conclusion(s)/Recommendation(s): Severely reduced LVEF, worse compared to prior report. Findings communicated to Dr. Jomarie Longs. FINDINGS  Left Ventricle: GLobal hypokinesis with severe hypokinesis of inferolateral wall. Echo contrast shows trabeculation but no evidence of LV thrombus. Left ventricular ejection fraction, by estimation, is 25 to 30%. The left ventricle has severely decreased function. The left ventricle demonstrates regional  wall motion abnormalities. Severe hypokinesis of the left ventricular, entire inferolateral wall. Definity contrast agent was given IV to delineate the left ventricular endocardial borders. The  left ventricular internal cavity size was mildly to moderately dilated. There is no left ventricular hypertrophy. Left ventricular diastolic parameters are consistent with Grade III diastolic dysfunction (restrictive). Elevated left ventricular end-diastolic pressure. Right Ventricle: The right ventricular size is normal. Right vetricular wall thickness was not well visualized. Right ventricular systolic function is moderately reduced. There is moderately elevated pulmonary artery systolic pressure. The tricuspid regurgitant velocity is 3.54 m/s, and with an assumed right atrial pressure of 8 mmHg, the estimated right ventricular systolic pressure is 58.1 mmHg. Left [facility]: Left atrial size was normal in size. Right [facility]: Right atrial size was mildly dilated. Pericardium: There is no evidence of pericardial effusion. Mitral Valve: The mitral valve is normal in structure. Mild to moderate mitral valve regurgitation. No evidence of mitral valve stenosis. Tricuspid Valve: The tricuspid valve is normal in  structure. Tricuspid valve regurgitation is mild . No evidence of tricuspid stenosis. Aortic Valve: The aortic valve is tricuspid. There is mild calcification of the aortic valve. There is mild thickening of the aortic valve. Aortic valve regurgitation is mild. Aortic regurgitation PHT measures 609 msec. Aortic valve sclerosis/calcification is present, without any evidence of aortic stenosis. Aortic valve mean gradient measures 4.5 mmHg. Aortic valve peak gradient measures 9.1 mmHg. Aortic valve area, by VTI measures 1.37 cm. Pulmonic Valve: The pulmonic valve was grossly normal. Pulmonic valve regurgitation is mild. No evidence of pulmonic stenosis. Aorta: The aortic root, ascending aorta, aortic arch and descending aorta  are all structurally normal, with no evidence of dilitation or obstruction. Venous: The inferior vena cava is normal in size with less than 50% respiratory variability, suggesting right atrial pressure of 8 mmHg. IAS/Shunts: The atrial septum is grossly normal. Additional Comments: There is a large pleural effusion in both left and right lateral regions.  LEFT VENTRICLE PLAX 2D LVIDd:         5.60 cm      Diastology LVIDs:         5.00 cm      LV e' medial:    3.38 cm/s LV PW:         0.90 cm      LV E/e' medial:  32.0 LV IVS:        0.90 cm      LV e' lateral:   3.60 cm/s LVOT diam:     2.00 cm      LV E/e' lateral: 30.0 LV SV:         47 LV SV Index:   26 LVOT Area:     3.14 cm  LV Volumes (MOD) LV vol d, MOD A2C: 142.0 ml LV vol d, MOD A4C: 124.5 ml LV vol s, MOD A2C: 102.3 ml LV vol s, MOD A4C: 87.2 ml LV SV MOD A2C:     39.7 ml LV SV MOD A4C:     124.5 ml LV SV MOD BP:      36.9 ml RIGHT VENTRICLE RV Basal diam:  3.60 cm RV Mid diam:    3.40 cm RV S prime:     8.78 cm/s TAPSE (M-mode): 1.1 cm LEFT ATRIUM             Index        RIGHT ATRIUM           Index LA diam:        3.50 cm 1.92 cm/m   RA Area:     14.00 cm LA Vol (A2C):   47.0 ml 25.81 ml/m  RA Volume:   41.10 ml  22.57 ml/m LA Vol (A4C):   41.4 ml 22.73 ml/m LA Biplane Vol: 44.7 ml 24.55 ml/m  AORTIC VALVE                    PULMONIC VALVE AV Area (Vmax):    1.68 cm     PV Vmax:          0.84 m/s AV Area (Vmean):   1.49 cm     PV Peak grad:     2.8 mmHg AV Area (VTI):     1.37 cm     PR End Diast Vel: 14.59 msec AV Vmax:           150.50 cm/s AV Vmean:          98.800 cm/s AV VTI:  0.344 m AV Peak Grad:      9.1 mmHg AV Mean Grad:      4.5 mmHg LVOT Vmax:         80.60 cm/s LVOT Vmean:        47.000 cm/s LVOT VTI:          0.150 m LVOT/AV VTI ratio: 0.44 AI PHT:            609 msec  AORTA Ao Root diam: 3.00 cm Ao Asc diam:  2.70 cm MITRAL VALVE                TRICUSPID VALVE MV Area (PHT): 4.80 cm     TR Peak grad:   50.1 mmHg MV  Decel Time: 158 msec     TR Vmax:        354.00 cm/s MR Peak grad: 76.9 mmHg MR Vmax:      438.33 cm/s   SHUNTS MV E velocity: 108.00 cm/s  Systemic VTI:  0.15 m MV A velocity: 27.90 cm/s   Systemic Diam: 2.00 cm MV E/A ratio:  3.87 Jodelle Red MD Electronically signed by Jodelle Red MD Signature Date/Time: 07/27/2022/3:44:12 PM    Final      Scheduled Meds:  aspirin EC  81 mg Oral Daily   atorvastatin  80 mg Oral Daily   enoxaparin (LOVENOX) injection  40 mg Subcutaneous Q24H   [START ON 07/30/2022] furosemide  40 mg Oral Daily   metoprolol succinate  25 mg Oral Daily   mometasone-formoterol  2 puff Inhalation BID   sodium chloride flush  3 mL Intravenous Q12H   spironolactone  12.5 mg Oral Daily   Continuous Infusions:  sodium chloride       LOS: 1 day    Time spent:    Zannie Cove, MD [facility]   07/29/2022, 11:22 AM

## 2022-07-30 ENCOUNTER — Encounter (HOSPITAL_COMMUNITY)
Admission: EM | Disposition: A | Discharge status: Home / Self Care | Payer: Self-pay | Source: Home / Self Care | Attending: Internal Medicine

## 2022-07-30 DIAGNOSIS — I429 Cardiomyopathy, unspecified: Secondary | ICD-10-CM

## 2022-07-30 HISTORY — PX: RIGHT/LEFT HEART CATH AND CORONARY ANGIOGRAPHY: CATH118266

## 2022-07-30 LAB — POCT I-STAT 7, (LYTES, BLD GAS, ICA,H+H)
Acid-Base Excess: 3 mmol/L — ABNORMAL HIGH (ref 0.0–2.0)
Bicarbonate: 28.8 mmol/L — ABNORMAL HIGH (ref 20.0–28.0)
Calcium, Ion: 1.17 mmol/L (ref 1.15–1.40)
HCT: 41 % (ref 39.0–52.0)
Hemoglobin: 13.9 g/dL (ref 13.0–17.0)
O2 Saturation: 99 %
Potassium: 4.1 mmol/L (ref 3.5–5.1)
Sodium: 135 mmol/L (ref 135–145)
TCO2: 30 mmol/L (ref 22–32)
pCO2 arterial: 45.9 mmHg (ref 32–48)
pH, Arterial: 7.405 (ref 7.35–7.45)
pO2, Arterial: 154 mmHg — ABNORMAL HIGH (ref 83–108)

## 2022-07-30 LAB — POCT I-STAT EG7
Acid-Base Excess: 5 mmol/L — ABNORMAL HIGH (ref 0.0–2.0)
Acid-Base Excess: 5 mmol/L — ABNORMAL HIGH (ref 0.0–2.0)
Bicarbonate: 30.7 mmol/L — ABNORMAL HIGH (ref 20.0–28.0)
Bicarbonate: 30.9 mmol/L — ABNORMAL HIGH (ref 20.0–28.0)
Calcium, Ion: 1.19 mmol/L (ref 1.15–1.40)
Calcium, Ion: 1.19 mmol/L (ref 1.15–1.40)
HCT: 41 % (ref 39.0–52.0)
HCT: 41 % (ref 39.0–52.0)
Hemoglobin: 13.9 g/dL (ref 13.0–17.0)
Hemoglobin: 13.9 g/dL (ref 13.0–17.0)
O2 Saturation: 69 %
O2 Saturation: 69 %
Potassium: 4.1 mmol/L (ref 3.5–5.1)
Potassium: 4.1 mmol/L (ref 3.5–5.1)
Sodium: 136 mmol/L (ref 135–145)
Sodium: 137 mmol/L (ref 135–145)
TCO2: 32 mmol/L (ref 22–32)
TCO2: 32 mmol/L (ref 22–32)
pCO2, Ven: 51.1 mmHg (ref 44–60)
pCO2, Ven: 51.7 mmHg (ref 44–60)
pH, Ven: 7.384 (ref 7.25–7.43)
pH, Ven: 7.387 (ref 7.25–7.43)
pO2, Ven: 37 mmHg (ref 32–45)
pO2, Ven: 37 mmHg (ref 32–45)

## 2022-07-30 LAB — GLUCOSE, CAPILLARY
Glucose-Capillary: 106 mg/dL — ABNORMAL HIGH (ref 70–99)
Glucose-Capillary: 91 mg/dL (ref 70–99)
Glucose-Capillary: 125 mg/dL — ABNORMAL HIGH (ref 70–99)
Glucose-Capillary: 106 mg/dL — ABNORMAL HIGH (ref 70–99)

## 2022-07-30 LAB — BASIC METABOLIC PANEL
Anion gap: 10 (ref 5–15)
BUN: 36 mg/dL — ABNORMAL HIGH (ref 8–23)
CO2: 24 mmol/L (ref 22–32)
Calcium: 8.3 mg/dL — ABNORMAL LOW (ref 8.9–10.3)
Chloride: 96 mmol/L — ABNORMAL LOW (ref 98–111)
Creatinine, Ser: 1.77 mg/dL — ABNORMAL HIGH (ref 0.61–1.24)
GFR, Estimated: 40 mL/min — ABNORMAL LOW (ref >60)
Glucose, Bld: 104 mg/dL — ABNORMAL HIGH (ref 70–99)
Potassium: 3.7 mmol/L (ref 3.5–5.1)
Sodium: 130 mmol/L — ABNORMAL LOW (ref 135–145)

## 2022-07-30 LAB — CBC WITH DIFFERENTIAL/PLATELET
Abs Immature Granulocytes: 0.03 10*3/uL (ref 0.00–0.07)
Basophils Absolute: 0.1 10*3/uL (ref 0.0–0.1)
Basophils Relative: 1 %
Eosinophils Absolute: 0.8 10*3/uL — ABNORMAL HIGH (ref 0.0–0.5)
Eosinophils Relative: 9 %
HCT: 38.3 % — ABNORMAL LOW (ref 39.0–52.0)
Hemoglobin: 13 g/dL (ref 13.0–17.0)
Immature Granulocytes: 0 %
Lymphocytes Relative: 23 %
Lymphs Abs: 2.1 10*3/uL (ref 0.7–4.0)
MCH: 28.6 pg (ref 26.0–34.0)
MCHC: 33.9 g/dL (ref 30.0–36.0)
MCV: 84.4 fL (ref 80.0–100.0)
Monocytes Absolute: 1 10*3/uL (ref 0.1–1.0)
Monocytes Relative: 11 %
Neutro Abs: 4.9 10*3/uL (ref 1.7–7.7)
Neutrophils Relative %: 56 %
Platelets: 220 10*3/uL (ref 150–400)
RBC: 4.54 MIL/uL (ref 4.22–5.81)
RDW: 16 % — ABNORMAL HIGH (ref 11.5–15.5)
WBC: 8.9 10*3/uL (ref 4.0–10.5)
nRBC: 0 % (ref 0.0–0.2)

## 2022-07-30 SURGERY — RIGHT/LEFT HEART CATH AND CORONARY ANGIOGRAPHY
Anesthesia: LOCAL

## 2022-07-30 MED ORDER — SODIUM CHLORIDE 0.9% FLUSH
3.0000 mL | Freq: Two times a day (BID) | INTRAVENOUS | Status: DC
  Administered 2022-07-30 – 2022-07-31 (×4): 3 mL via INTRAVENOUS

## 2022-07-30 MED ORDER — LABETALOL HCL 5 MG/ML IV SOLN: 10.0000 mg | INTRAVENOUS | Status: AC | PRN

## 2022-07-30 MED ORDER — DOCUSATE SODIUM 100 MG PO CAPS
200.0000 mg | ORAL_CAPSULE | Freq: Every day | ORAL | Status: DC
  Administered 2022-07-30 – 2022-07-31 (×2): 200 mg via ORAL
  Filled 2022-07-30 (×3): qty 2

## 2022-07-30 MED ORDER — HEPARIN SODIUM (PORCINE) 1000 UNIT/ML IJ SOLN
INTRAMUSCULAR | Status: DC | PRN
  Administered 2022-07-30: 3300 [IU] via INTRAVENOUS

## 2022-07-30 MED ORDER — FENTANYL CITRATE (PF) 100 MCG/2ML IJ SOLN
INTRAMUSCULAR | Status: AC
  Filled 2022-07-30: qty 2

## 2022-07-30 MED ORDER — ENOXAPARIN SODIUM 40 MG/0.4ML IJ SOSY
40.0000 mg | PREFILLED_SYRINGE | INTRAMUSCULAR | Status: DC
  Administered 2022-07-31: 40 mg via SUBCUTANEOUS
  Filled 2022-07-30 (×2): qty 0.4

## 2022-07-30 MED ORDER — VERAPAMIL HCL 2.5 MG/ML IV SOLN
INTRAVENOUS | Status: DC | PRN
  Administered 2022-07-30: 10 mL via INTRA_ARTERIAL

## 2022-07-30 MED ORDER — HEPARIN (PORCINE) IN NACL 1000-0.9 UT/500ML-% IV SOLN
INTRAVENOUS | Status: DC | PRN
  Administered 2022-07-30 (×2): 500 mL

## 2022-07-30 MED ORDER — SODIUM CHLORIDE 0.9 % IV SOLN: 250.0000 mL | INTRAVENOUS | Status: DC | PRN

## 2022-07-30 MED ORDER — LIDOCAINE HCL (PF) 1 % IJ SOLN
INTRAMUSCULAR | Status: DC | PRN
  Administered 2022-07-30: 5 mL

## 2022-07-30 MED ORDER — SODIUM CHLORIDE 0.9 % IV SOLN: INTRAVENOUS | Status: AC

## 2022-07-30 MED ORDER — MIDAZOLAM HCL 2 MG/2ML IJ SOLN
INTRAMUSCULAR | Status: AC
  Filled 2022-07-30: qty 2

## 2022-07-30 MED ORDER — HYDRALAZINE HCL 20 MG/ML IJ SOLN: 10.0000 mg | INTRAMUSCULAR | Status: AC | PRN

## 2022-07-30 MED ORDER — LIDOCAINE HCL (PF) 1 % IJ SOLN
INTRAMUSCULAR | Status: AC
  Filled 2022-07-30: qty 30

## 2022-07-30 MED ORDER — ACETAMINOPHEN 325 MG PO TABS: 650.0000 mg | ORAL_TABLET | ORAL | Status: DC | PRN

## 2022-07-30 MED ORDER — HEPARIN (PORCINE) IN NACL 1000-0.9 UT/500ML-% IV SOLN
INTRAVENOUS | Status: AC
  Filled 2022-07-30: qty 500

## 2022-07-30 MED ORDER — HEPARIN SODIUM (PORCINE) 1000 UNIT/ML IJ SOLN
INTRAMUSCULAR | Status: AC
  Filled 2022-07-30: qty 10

## 2022-07-30 MED ORDER — ATORVASTATIN CALCIUM 80 MG PO TABS: 80.0000 mg | ORAL_TABLET | Freq: Every day | ORAL | Status: DC

## 2022-07-30 MED ORDER — VERAPAMIL HCL 2.5 MG/ML IV SOLN
INTRAVENOUS | Status: AC
  Filled 2022-07-30: qty 2

## 2022-07-30 MED ORDER — FENTANYL CITRATE (PF) 100 MCG/2ML IJ SOLN
INTRAMUSCULAR | Status: DC | PRN
  Administered 2022-07-30: 25 ug via INTRAVENOUS

## 2022-07-30 MED ORDER — ONDANSETRON HCL 4 MG/2ML IJ SOLN: 4.0000 mg | Freq: Four times a day (QID) | INTRAMUSCULAR | Status: DC | PRN

## 2022-07-30 MED ORDER — MIDAZOLAM HCL 2 MG/2ML IJ SOLN
INTRAMUSCULAR | Status: DC | PRN
  Administered 2022-07-30: 1 mg via INTRAVENOUS

## 2022-07-30 MED ORDER — IOHEXOL 350 MG/ML SOLN
INTRAVENOUS | Status: DC | PRN
  Administered 2022-07-30: 45 mL

## 2022-07-30 MED ORDER — SODIUM CHLORIDE 0.9% FLUSH: 3.0000 mL | INTRAVENOUS | Status: DC | PRN

## 2022-07-30 MED ORDER — ASPIRIN 81 MG PO CHEW: 81.0000 mg | CHEWABLE_TABLET | Freq: Every day | ORAL | Status: DC

## 2022-07-30 SURGICAL SUPPLY — 13 items
CATH OPTITORQUE TIG 4.0 5F (CATHETERS) IMPLANT
CATH SWAN GANZ 7F STRAIGHT (CATHETERS) IMPLANT
DEVICE RAD COMP TR BAND LRG (VASCULAR PRODUCTS) IMPLANT
GLIDESHEATH SLEND SS 6F .021 (SHEATH) IMPLANT
GLIDESHEATH SLENDER 7FR .021G (SHEATH) IMPLANT
GUIDEWIRE .025 260CM (WIRE) IMPLANT
GUIDEWIRE INQWIRE 1.5J.035X260 (WIRE) IMPLANT
INQWIRE 1.5J .035X260CM (WIRE) ×1
KIT HEART LEFT (KITS) ×1 IMPLANT
PACK CARDIAC CATHETERIZATION (CUSTOM PROCEDURE TRAY) ×1 IMPLANT
SHEATH PROBE COVER 6X72 (BAG) IMPLANT
TRANSDUCER W/STOPCOCK (MISCELLANEOUS) ×1 IMPLANT
TUBING CIL FLEX 10 FLL-RA (TUBING) ×1 IMPLANT

## 2022-07-30 NOTE — H&P (View-Only) (Signed)
Progress Note  Patient Name: Cody Small Date of Encounter: 07/30/2022  Primary Cardiologist: None   Subjective   Patient seen and examined at his bedside. Aware of the his cardiac cath today.  Inpatient Medications    Scheduled Meds:  aspirin EC  81 mg Oral Daily   atorvastatin  80 mg Oral Daily   docusate sodium  200 mg Oral Daily   enoxaparin (LOVENOX) injection  40 mg Subcutaneous Q24H   furosemide  40 mg Oral Daily   metoprolol succinate  25 mg Oral Daily   mometasone-formoterol  2 puff Inhalation BID   sodium chloride flush  3 mL Intravenous Q12H   sodium chloride flush  3 mL Intravenous Q12H   spironolactone  12.5 mg Oral Daily   Continuous Infusions:  sodium chloride     sodium chloride     sodium chloride     PRN Meds: sodium chloride, sodium chloride, acetaminophen, ondansetron (ZOFRAN) IV, sodium chloride flush, sodium chloride flush   Vital Signs    Vitals:   07/29/22 2054 07/29/22 2153 07/29/22 2342 07/30/22 0631  BP: 124/63  119/72 122/75  Pulse: 77  76 73  Resp: 18  18 16   Temp: (!) 103.1 F (39.5 C) (!) 101 F (38.3 C) 99.9 F (37.7 C) 99.5 F (37.5 C)  TempSrc: Oral Oral Oral Oral  SpO2: 99%  98% 97%  Weight:    66.9 kg  Height:        Intake/Output Summary (Last 24 hours) at 07/30/2022 0905 Last data filed at 07/29/2022 2042 Gross per 24 hour  Intake 420 ml  Output 400 ml  Net 20 ml   Filed Weights   07/29/22 0425 07/29/22 2003 07/30/22 0631  Weight: 65.4 kg 65.4 kg 66.9 kg    Telemetry    Over night short run of NSVT  - Personally Reviewed  ECG    None - Personally Reviewed  Physical Exam    General: Comfortable in bed Head: Atraumatic, normal size  Eyes: PEERLA, EOMI  Neck: Supple, normal JVD Cardiac: Normal S1, S2; RRR; no murmurs, rubs, or gallops Lungs: Clear to auscultation bilaterally Abd: Soft, nontender, no hepatomegaly  Ext: warm, no edema Musculoskeletal: No deformities, BUE and BLE strength normal and  equal Skin: Warm and dry, no rashes   Neuro: Alert and oriented to person, place, time, and situation, CNII-XII grossly intact, no focal deficits  Psych: Normal mood and affect   Labs    Chemistry Recent Labs  Lab 07/28/22 0137 07/29/22 0147 07/30/22 0257  NA 136 134* 130*  K 3.5 3.5 3.7  CL 103 99 96*  CO2 24 24 24   GLUCOSE 111* 129* 104*  BUN 26* 35* 36*  CREATININE 1.69* 1.73* 1.77*  CALCIUM 8.9 8.8* 8.3*  GFRNONAA 42* 41* 40*  ANIONGAP 9 11 10      Hematology Recent Labs  Lab 07/26/22 1809 07/29/22 0147 07/30/22 0257  WBC 7.9 10.4 8.9  RBC 3.79* 4.64 4.54  HGB 10.9* 13.2 13.0  HCT 32.4* 39.7 38.3*  MCV 85.5 85.6 84.4  MCH 28.8 28.4 28.6  MCHC 33.6 33.2 33.9  RDW 16.7* 16.5* 16.0*  PLT 240 273 220    Cardiac EnzymesNo results for input(s): "TROPONINI" in the last 168 hours. No results for input(s): "TROPIPOC" in the last 168 hours.   BNP Recent Labs  Lab 07/26/22 1844  BNP 3,067.7*     DDimer No results for input(s): "DDIMER" in the last 168 hours.   Radiology  No results found.  Cardiac Studies   Echocardiogram 07/27/22 1. Left ventricular ejection fraction, by estimation, is 25 to 30%. The left ventricle has severely decreased function. The left ventricle demonstrates regional wall motion abnormalities (see scoring  diagram/findings for description). The left ventricular internal cavity size was mildly to moderately dilated. Left ventricular diastolic parameters are consistent with Grade III diastolic  dysfunction (restrictive). Elevated left ventricular end-diastolic  pressure. There is severe hypokinesis of  the left ventricular, entire inferolateral wall.   2. Right ventricular systolic function is moderately reduced. The right  ventricular size is normal. There is moderately elevated pulmonary artery  systolic pressure. The estimated right ventricular systolic pressure is  58.1 mmHg.   3. Right atrial size was mildly dilated.   4. Large  pleural effusion in both left and right lateral regions.   5. The mitral valve is normal in structure. Mild to moderate mitral valve  regurgitation. No evidence of mitral stenosis.   6. The aortic valve is tricuspid. There is mild calcification of the  aortic valve. There is mild thickening of the aortic valve. Aortic valve  regurgitation is mild. Aortic valve sclerosis/calcification is present,  without any evidence of aortic  stenosis. Aortic regurgitation PHT measures 609 msec. Aortic valve mean  gradient measures 4.5 mmHg.   7. The inferior vena cava is normal in size with <50% respiratory  variability, suggesting right atrial pressure of 8 mmHg.   Comparison(s): Prior images unable to be directly viewed, comparison made  by report only. Echo report from [facility] 05/05/21: EF 40-45%, hypokinesis of  inferolateral wall, moderate pulmonary hypertension.    Patient Profile     76 y.o. 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  Plan for ischemic evaluation today with right and left heart catheterization. Clinically he does not appear to be volume overloaded. He is currently on metoprolol succinate 25 mg daily, spironolactone 12.5 mg daily, postcatheterization will start entresto.  Continue Lipitor 80 mg daily. Also now with COVID-19 but is asymptomatic.  Will update his daughter yesterday the team  discussed with her about the heart catheterization. The patient understands that risks include but are not limited to stroke (1 in 1000), death (1 in 1000), kidney failure [usually temporary] (1 in 500), bleeding (1 in 200), allergic reaction [possibly serious] (1 in 200), and agrees to proceed.  For questions or updates, please contact [facility] Please consult [email or web address] for contact info under Cardiology/STEMI.      Signed, Abayomi Pattison, DO  07/30/2022, 9:05 AM

## 2022-07-30 NOTE — Progress Notes (Signed)
Progress Note  Patient Name: Cody Small Date of Encounter: 07/30/2022  Primary Cardiologist: None   Subjective   Patient seen and examined at his bedside. Aware of the his cardiac cath today.  Inpatient Medications    Scheduled Meds:  aspirin EC  81 mg Oral Daily   atorvastatin  80 mg Oral Daily   docusate sodium  200 mg Oral Daily   enoxaparin (LOVENOX) injection  40 mg Subcutaneous Q24H   furosemide  40 mg Oral Daily   metoprolol succinate  25 mg Oral Daily   mometasone-formoterol  2 puff Inhalation BID   sodium chloride flush  3 mL Intravenous Q12H   sodium chloride flush  3 mL Intravenous Q12H   spironolactone  12.5 mg Oral Daily   Continuous Infusions:  sodium chloride     sodium chloride     sodium chloride     PRN Meds: sodium chloride, sodium chloride, acetaminophen, ondansetron (ZOFRAN) IV, sodium chloride flush, sodium chloride flush   Vital Signs    Vitals:   07/29/22 2054 07/29/22 2153 07/29/22 2342 07/30/22 0631  BP: 124/63  119/72 122/75  Pulse: 77  76 73  Resp: 18  18 16   Temp: (!) 103.1 F (39.5 C) (!) 101 F (38.3 C) 99.9 F (37.7 C) 99.5 F (37.5 C)  TempSrc: Oral Oral Oral Oral  SpO2: 99%  98% 97%  Weight:    66.9 kg  Height:        Intake/Output Summary (Last 24 hours) at 07/30/2022 0905 Last data filed at 07/29/2022 2042 Gross per 24 hour  Intake 420 ml  Output 400 ml  Net 20 ml   Filed Weights   07/29/22 0425 07/29/22 2003 07/30/22 0631  Weight: 65.4 kg 65.4 kg 66.9 kg    Telemetry    Over night short run of NSVT  - Personally Reviewed  ECG    None - Personally Reviewed  Physical Exam    General: Comfortable in bed Head: Atraumatic, normal size  Eyes: PEERLA, EOMI  Neck: Supple, normal JVD Cardiac: Normal S1, S2; RRR; no murmurs, rubs, or gallops Lungs: Clear to auscultation bilaterally Abd: Soft, nontender, no hepatomegaly  Ext: warm, no edema Musculoskeletal: No deformities, BUE and BLE strength normal and  equal Skin: Warm and dry, no rashes   Neuro: Alert and oriented to person, place, time, and situation, CNII-XII grossly intact, no focal deficits  Psych: Normal mood and affect   Labs    Chemistry Recent Labs  Lab 07/28/22 0137 07/29/22 0147 07/30/22 0257  NA 136 134* 130*  K 3.5 3.5 3.7  CL 103 99 96*  CO2 24 24 24   GLUCOSE 111* 129* 104*  BUN 26* 35* 36*  CREATININE 1.69* 1.73* 1.77*  CALCIUM 8.9 8.8* 8.3*  GFRNONAA 42* 41* 40*  ANIONGAP 9 11 10      Hematology Recent Labs  Lab 07/26/22 1809 07/29/22 0147 07/30/22 0257  WBC 7.9 10.4 8.9  RBC 3.79* 4.64 4.54  HGB 10.9* 13.2 13.0  HCT 32.4* 39.7 38.3*  MCV 85.5 85.6 84.4  MCH 28.8 28.4 28.6  MCHC 33.6 33.2 33.9  RDW 16.7* 16.5* 16.0*  PLT 240 273 220    Cardiac EnzymesNo results for input(s): "TROPONINI" in the last 168 hours. No results for input(s): "TROPIPOC" in the last 168 hours.   BNP Recent Labs  Lab 07/26/22 1844  BNP 3,067.7*     DDimer No results for input(s): "DDIMER" in the last 168 hours.   Radiology  No results found.  Cardiac Studies   Echocardiogram 07/27/22 1. Left ventricular ejection fraction, by estimation, is 25 to 30%. The left ventricle has severely decreased function. The left ventricle demonstrates regional wall motion abnormalities (see scoring  diagram/findings for description). The left ventricular internal cavity size was mildly to moderately dilated. Left ventricular diastolic parameters are consistent with Grade III diastolic  dysfunction (restrictive). Elevated left ventricular end-diastolic  pressure. There is severe hypokinesis of  the left ventricular, entire inferolateral wall.   2. Right ventricular systolic function is moderately reduced. The right  ventricular size is normal. There is moderately elevated pulmonary artery  systolic pressure. The estimated right ventricular systolic pressure is  58.1 mmHg.   3. Right atrial size was mildly dilated.   4. Large  pleural effusion in both left and right lateral regions.   5. The mitral valve is normal in structure. Mild to moderate mitral valve  regurgitation. No evidence of mitral stenosis.   6. The aortic valve is tricuspid. There is mild calcification of the  aortic valve. There is mild thickening of the aortic valve. Aortic valve  regurgitation is mild. Aortic valve sclerosis/calcification is present,  without any evidence of aortic  stenosis. Aortic regurgitation PHT measures 609 msec. Aortic valve mean  gradient measures 4.5 mmHg.   7. The inferior vena cava is normal in size with <50% respiratory  variability, suggesting right atrial pressure of 8 mmHg.   Comparison(s): Prior images unable to be directly viewed, comparison made  by report only. Echo report from [facility] 05/05/21: EF 40-45%, hypokinesis of  inferolateral wall, moderate pulmonary hypertension.    Patient Profile     76 y.o. 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  Plan for ischemic evaluation today with right and left heart catheterization. Clinically he does not appear to be volume overloaded. He is currently on metoprolol succinate 25 mg daily, spironolactone 12.5 mg daily, postcatheterization will start entresto.  Continue Lipitor 80 mg daily. Also now with COVID-19 but is asymptomatic.  Will update his daughter yesterday the team  discussed with her about the heart catheterization. The patient understands that risks include but are not limited to stroke (1 in 1000), death (1 in 1000), kidney failure [usually temporary] (1 in 500), bleeding (1 in 200), allergic reaction [possibly serious] (1 in 200), and agrees to proceed.  For questions or updates, please contact [facility] Please consult [email or web address] for contact info under Cardiology/STEMI.      Signed, Gresia Isidoro, DO  07/30/2022, 9:05 AM

## 2022-07-30 NOTE — Progress Notes (Signed)
PROGRESS NOTE    Cody Small  WVP:[id number] DOB: Jan 24, 1946 DOA: 07/26/2022 PCP: Charolett Bumpers, PA-C   57 /M w/DM2, HTN, HLD, HFrEF with EF 40-45% CAD s/p stent, CVA, presented to ED with c/o 2 day h/o SOB, DOE, orthopnea. In the ED, BP elevated, BNP >3000, troponin 40, 40. Hb 10.9, CXr w/ interstitial edema -Improved with diuretics, echo noted drop in EF down to 25-30% -Cards consulting, plan for LHC/RHC on Monday -12/10 night became febrile, tested positive for COVID  Subjective: Feels well, febrile overnight, mild cough, tested positive for COVID  Assessment and Plan:  Acute on chronic systolic CHF Pulm HTN -known ICM EF 45-50% -Compliant with meds, admits to dietary indiscretions -Repeat echo with EF down to 25-30%, grade 3 DD, severe inferolateral hypokinesis -Diuresed with IV Lasix, he is 5.3 L negative, clinically euvolemic, hold further diuretics, cath today -Continue metoprolol, Aldactone -BMP in a.m.  SARS COVID-19 infection -Febrile overnight, rare cough, stable without hypoxia -Monitor clinically, will consider molnupiravir if he reports mild symptoms  Stage 3b chronic kidney disease (CKD) (HCC) Looks like baseline creat ~1.6 -Now improving, close to baseline -Holding further diuretics today  CAD  -S/P-DES to circumflex on 04/2020  -Cont ASA, statin, BB  HLD (hyperlipidemia) Cont statin.  DM2 (diabetes mellitus, type 2) (HCC) -Diet controlled, A1c was 6.3 in November  H/o CVA -Continue aspirin, statin   DVT prophylaxis: lovenox Code Status: Full Code Family Communication: None present, left message for daughter yesterday Disposition Plan: Home tomorrow  Consultants: Cards   Procedures:   Antimicrobials:    Objective: Vitals:   07/30/22 1158 07/30/22 1158 07/30/22 1203 07/30/22 1208  BP: 117/61  (!) 112/56 119/72  Pulse: 67  70 68  Resp: (!) 29  (!) 24 (!) 23  Temp:      TempSrc:      SpO2: 95% 93% 91% 97%  Weight:      Height:         Intake/Output Summary (Last 24 hours) at 07/30/2022 1226 Last data filed at 07/29/2022 2042 Gross per 24 hour  Intake 240 ml  Output 400 ml  Net -160 ml   Filed Weights   07/29/22 0425 07/29/22 2003 07/30/22 0631  Weight: 65.4 kg 65.4 kg 66.9 kg    Examination:  Pleasant thinly built male sitting up in bed, AAOx3, no distress HEENT: No JVD CVS: S1-S2, regular rhythm Lungs: Clear bilaterally Abdomen: Soft, nontender, bowel sounds present Extremities: No edema   Data Reviewed:   CBC: Recent Labs  Lab 07/26/22 1809 07/29/22 0147 07/30/22 0257  WBC 7.9 10.4 8.9  NEUTROABS 5.0  --  4.9  HGB 10.9* 13.2 13.0  HCT 32.4* 39.7 38.3*  MCV 85.5 85.6 84.4  PLT 240 273 220   Basic Metabolic Panel: Recent Labs  Lab 07/26/22 1844 07/28/22 0137 07/29/22 0147 07/30/22 0257  NA 136 136 134* 130*  K 3.5 3.5 3.5 3.7  CL 109 103 99 96*  CO2 18* 24 24 24   GLUCOSE 109* 111* 129* 104*  BUN 24* 26* 35* 36*  CREATININE 1.81* 1.69* 1.73* 1.77*  CALCIUM 8.7* 8.9 8.8* 8.3*   GFR: Estimated Creatinine Clearance: 33.7 mL/min (A) (by C-G formula based on SCr of 1.77 mg/dL (H)). Liver Function Tests: No results for input(s): "AST", "ALT", "ALKPHOS", "BILITOT", "PROT", "ALBUMIN" in the last 168 hours. No results for input(s): "LIPASE", "AMYLASE" in the last 168 hours. No results for input(s): "AMMONIA" in the last 168 hours. Coagulation Profile: No results  for input(s): "INR", "PROTIME" in the last 168 hours. Cardiac Enzymes: No results for input(s): "CKTOTAL", "CKMB", "CKMBINDEX", "TROPONINI" in the last 168 hours. BNP (last 3 results) No results for input(s): "PROBNP" in the last 8760 hours. HbA1C: No results for input(s): "HGBA1C" in the last 72 hours.  CBG: Recent Labs  Lab 07/29/22 0757 07/29/22 1229 07/29/22 1616 07/29/22 2059 07/30/22 0825  GLUCAP 112* 109* 107* 108* 106*   Lipid Profile: Recent Labs    07/29/22 0147  CHOL 127  HDL 40*  LDLCALC 72  TRIG  77  CHOLHDL 3.2   Thyroid Function Tests: No results for input(s): "TSH", "T4TOTAL", "FREET4", "T3FREE", "THYROIDAB" in the last 72 hours. Anemia Panel: No results for input(s): "VITAMINB12", "FOLATE", "FERRITIN", "TIBC", "IRON", "RETICCTPCT" in the last 72 hours. Urine analysis: No results found for: "COLORURINE", "APPEARANCEUR", "LABSPEC", "PHURINE", "GLUCOSEU", "HGBUR", "BILIRUBINUR", "KETONESUR", "PROTEINUR", "UROBILINOGEN", "NITRITE", "LEUKOCYTESUR" Sepsis Labs: @LABRCNTIP (procalcitonin:4,lacticidven:4)  ) Recent Results (from the past 240 hour(s))  Resp Panel by RT-PCR (Flu A&B, Covid) Anterior Nasal Swab     Status: None   Collection Time: 07/26/22  6:09 PM   Specimen: Anterior Nasal Swab  Result Value Ref Range Status   SARS Coronavirus 2 by RT PCR NEGATIVE NEGATIVE Final    Comment: (NOTE) SARS-CoV-2 target nucleic acids are NOT DETECTED.  The SARS-CoV-2 RNA is generally detectable in upper respiratory specimens during the acute phase of infection. The lowest concentration of SARS-CoV-2 viral copies this assay can detect is 138 copies/mL. A negative result does not preclude SARS-Cov-2 infection and should not be used as the sole basis for treatment or other patient management decisions. A negative result may occur with  improper specimen collection/handling, submission of specimen other than nasopharyngeal swab, presence of viral mutation(s) within the areas targeted by this assay, and inadequate number of viral copies(<138 copies/mL). A negative result must be combined with clinical observations, patient history, and epidemiological information. The expected result is Negative.  Fact Sheet for Patients:  14/07/23  Fact Sheet for Healthcare Providers:  BloggerCourse.com  This test is no t yet approved or cleared by the [place] FDA and  has been authorized for detection and/or diagnosis of SARS-CoV-2  by FDA under an Emergency Use Authorization (EUA). This EUA will remain  in effect (meaning this test can be used) for the duration of the COVID-19 declaration under Section 564(b)(1) of the Act, 21 U.S.C.section 360bbb-3(b)(1), unless the authorization is terminated  or revoked sooner.       Influenza A by PCR NEGATIVE NEGATIVE Final   Influenza B by PCR NEGATIVE NEGATIVE Final    Comment: (NOTE) The Xpert Xpress SARS-CoV-2/FLU/RSV plus assay is intended as an aid in the diagnosis of influenza from Nasopharyngeal swab specimens and should not be used as a sole basis for treatment. Nasal washings and aspirates are unacceptable for Xpert Xpress SARS-CoV-2/FLU/RSV testing.  Fact Sheet for Patients: [place]  Fact Sheet for Healthcare Providers: BloggerCourse.com  This test is not yet approved or cleared by the [place] FDA and has been authorized for detection and/or diagnosis of SARS-CoV-2 by FDA under an Emergency Use Authorization (EUA). This EUA will remain in effect (meaning this test can be used) for the duration of the COVID-19 declaration under Section 564(b)(1) of the Act, 21 U.S.C. section 360bbb-3(b)(1), unless the authorization is terminated or revoked.  Performed at [facility], [address] [place], [place]   SARS Coronavirus 2 by RT PCR (hospital order, performed in [facility] hospital lab) *cepheid single result test* Anterior Nasal Swab     Status: Abnormal   Collection Time: 07/29/22 10:41 PM   Specimen: Anterior Nasal Swab  Result Value Ref Range Status   SARS Coronavirus 2 by RT PCR POSITIVE (A) NEGATIVE Final    Comment: (NOTE) SARS-CoV-2 target nucleic acids are DETECTED  SARS-CoV-2 RNA is generally detectable in upper respiratory specimens  during the acute phase of infection.  Positive results are indicative  of the presence of the identified virus, but do not  rule out bacterial infection or co-infection with other pathogens not detected by the test.  Clinical correlation with patient history and  other diagnostic information is necessary to determine patient infection status.  The expected result is negative.  Fact Sheet for Patients:   RoadLapTop.co.za   Fact Sheet for Healthcare Providers:   http://kim-miller.com/    This test is not yet approved or cleared by the [place] FDA and  has been authorized for detection and/or diagnosis of SARS-CoV-2 by FDA under an Emergency Use Authorization (EUA).  This EUA will remain in effect (meaning this test can be used) for the duration of  the COVID-19 declaration under Section 564(b)(1)  of the Act, 21 U.S.C. section 360-bbb-3(b)(1), unless the authorization is terminated or revoked sooner.   Performed at [facility], [address], [place], [place] [address]      Radiology Studies: No results found.   Scheduled Meds:  aspirin EC  81 mg Oral Daily   atorvastatin  80 mg Oral Daily   docusate sodium  200 mg Oral Daily   enoxaparin (LOVENOX) injection  40 mg Subcutaneous Q24H   furosemide  40 mg Oral Daily   metoprolol succinate  25 mg Oral Daily   mometasone-formoterol  2 puff Inhalation BID   sodium chloride flush  3 mL Intravenous Q12H   sodium chloride flush  3 mL Intravenous Q12H   spironolactone  12.5 mg Oral Daily   Continuous Infusions:  sodium chloride       LOS: 2 days    Time spent:    Zannie Cove, MD [facility]   07/30/2022, 12:26 PM

## 2022-07-30 NOTE — Progress Notes (Signed)
   Heart Failure Stewardship Pharmacist Progress Note   PCP: Charolett Bumpers, PA-C PCP-Cardiologist: None    HPI:  76 yo M with PMH of CAD s/p PCI in 2021 to LCx, CHF (40-45%), HTN, CKD III, T2DM, and asthma.  He presented to the ED on 12/7 with shortness of breath, orthopnea, and mild nonproductive cough. CXR suggestive of mild interstitial edema or pneumonia. ECHO 12/8 showed LVEF reduced to 25-30%, regional wall motion abnormalities, G3DD, severe hypokinesis of entire inferolateral wall, moderately reduced RV. Scheduled for [facility] today.  Current HF Medications: Diuretic: furosemide 40 mg PO daily Beta Blocker: metoprolol XL 25 mg daily MRA: spironolactone 12.5 mg daily  Prior to admission HF Medications: Diuretic: torsemide 20 mg MWF Beta blocker: metoprolol XL 25 mg daily ACE/ARB/ARNI: lisinopril 5 mg daily MRA: spironolactone 12.5 mg daily  Pertinent Lab Values: Serum creatinine 1.77, BUN 36, Potassium 3.7, Sodium 130, BNP 3067.7   Vital Signs: Weight: 147 lbs (admission weight: 156 lbs) Blood pressure: 120/70s  Heart rate: 60-70s  I/O: -0.3L yesterday; net -5.3L  Medication Assistance / Insurance Benefits Check: Does the patient have prescription insurance?  Yes Type of insurance plan: [employer]  Outpatient Pharmacy:  Prior to admission [facility] Is the patient willing to use [facility] TOC pharmacy at discharge? Yes Is the patient willing to transition their outpatient pharmacy to utilize a [facility] outpatient pharmacy?   Pending    Assessment: 1. Acute on chronic systolic and diastolic CHF (LVEF 25-30%), due to ICM. R/LHC scheduled for 12/11. NYHA class III symptoms. - Continue furosemide 40 mg PO daily until RHC today. Strict I/Os and daily weights. Keep K>4. Check magnesium in AM. Keep Mag>2. - Continue metoprolol XL 25 mg daily - Consider adding Entresto tomorrow if BP and renal function stable - Continue spironolactone 12.5 mg daily -  Consider adding SGLT2i after cath  Plan: 1) Medication changes recommended at this time: - Add Jardiance 10 mg daily after cath  2) Patient assistance: - Entresto copay $10.35 - Farxiga/Jardiance copay $10.35  3)  Education  - Initial education completed - Full education to be completed prior to discharge  Sharen Hones, PharmD, BCPS Heart Failure Engineer, building services Phone [phone]

## 2022-07-30 NOTE — Interval H&P Note (Signed)
Cath Lab Visit (complete for each Cath Lab visit)  Clinical Evaluation Leading to the Procedure:   ACS: No.  Non-ACS:    Anginal Classification: CCS I  Anti-ischemic medical therapy: Maximal Therapy (2 or more classes of medications)  Non-Invasive Test Results: No non-invasive testing performed  Prior CABG: No previous CABG      History and Physical Interval Note:  07/30/2022 11:13 AM  Cody Small  has presented today for surgery, with the diagnosis of unstable angina.  The various methods of treatment have been discussed with the patient and family. After consideration of risks, benefits and other options for treatment, the patient has consented to  Procedure(s): RIGHT/LEFT HEART CATH AND CORONARY ANGIOGRAPHY (N/A) as a surgical intervention.  The patient's history has been reviewed, patient examined, no change in status, stable for surgery.  I have reviewed the patient's chart and labs.  Questions were answered to the patient's satisfaction.     Cody Small

## 2022-07-30 NOTE — Progress Notes (Signed)
  X-cover Note: Pt developed fever of 101 this evening. +covid test.   Carollee Herter, DO [facility]

## 2022-07-31 ENCOUNTER — Encounter (HOSPITAL_COMMUNITY): Payer: Self-pay | Admitting: Cardiovascular Disease

## 2022-07-31 DIAGNOSIS — I5023 Acute on chronic systolic (congestive) heart failure: Secondary | ICD-10-CM | POA: Diagnosis not present

## 2022-07-31 LAB — CBC
HCT: 36.2 % — ABNORMAL LOW (ref 39.0–52.0)
Hemoglobin: 12.7 g/dL — ABNORMAL LOW (ref 13.0–17.0)
MCH: 29.2 pg (ref 26.0–34.0)
MCHC: 35.1 g/dL (ref 30.0–36.0)
MCV: 83.2 fL (ref 80.0–100.0)
Platelets: 212 10*3/uL (ref 150–400)
RBC: 4.35 MIL/uL (ref 4.22–5.81)
RDW: 16 % — ABNORMAL HIGH (ref 11.5–15.5)
WBC: 8.5 10*3/uL (ref 4.0–10.5)
nRBC: 0 % (ref 0.0–0.2)

## 2022-07-31 LAB — GLUCOSE, CAPILLARY
Glucose-Capillary: 93 mg/dL (ref 70–99)
Glucose-Capillary: 106 mg/dL — ABNORMAL HIGH (ref 70–99)

## 2022-07-31 LAB — BASIC METABOLIC PANEL
Anion gap: 10 (ref 5–15)
BUN: 42 mg/dL — ABNORMAL HIGH (ref 8–23)
CO2: 23 mmol/L (ref 22–32)
Calcium: 8.2 mg/dL — ABNORMAL LOW (ref 8.9–10.3)
Chloride: 97 mmol/L — ABNORMAL LOW (ref 98–111)
Creatinine, Ser: 1.66 mg/dL — ABNORMAL HIGH (ref 0.61–1.24)
GFR, Estimated: 43 mL/min — ABNORMAL LOW (ref >60)
Glucose, Bld: 107 mg/dL — ABNORMAL HIGH (ref 70–99)
Potassium: 4 mmol/L (ref 3.5–5.1)
Sodium: 130 mmol/L — ABNORMAL LOW (ref 135–145)

## 2022-07-31 MED ORDER — SACUBITRIL-VALSARTAN 24-26 MG PO TABS
1.0000 | ORAL_TABLET | Freq: Two times a day (BID) | ORAL | Status: DC
  Administered 2022-07-31 – 2022-08-01 (×2): 1 via ORAL
  Filled 2022-07-31 (×2): qty 1

## 2022-07-31 MED ORDER — SPIRONOLACTONE 25 MG PO TABS
25.0000 mg | ORAL_TABLET | Freq: Every day | ORAL | Status: DC
  Administered 2022-07-31: 25 mg via ORAL
  Filled 2022-07-31: qty 1

## 2022-07-31 MED ORDER — SPIRONOLACTONE 12.5 MG HALF TABLET
12.5000 mg | ORAL_TABLET | Freq: Every day | ORAL | Status: DC
  Administered 2022-08-01: 12.5 mg via ORAL
  Filled 2022-07-31: qty 1

## 2022-07-31 MED ORDER — EMPAGLIFLOZIN 10 MG PO TABS
10.0000 mg | ORAL_TABLET | Freq: Every day | ORAL | Status: DC
  Administered 2022-07-31 – 2022-08-01 (×2): 10 mg via ORAL
  Filled 2022-07-31 (×2): qty 1

## 2022-07-31 NOTE — Evaluation (Signed)
Physical Therapy Evaluation and Discharge Patient Details Name: Cody Small MRN: [id number] DOB: 05/20/1946 Today's Date: 07/31/2022  History of Present Illness  Pt is a 76 y.o. M who presents 07/26/2022 with 2 day history of SOB, DOE, orthopnea. Cath 12/11 with stable CAD. Echo noted drop in EF down to 25-30%. Significant PMH: DM2, HTN, HLD, HFrEF with EF 40-45% CAD s/p stent, CVA.  Clinical Impression  Patient evaluated by Physical Therapy with no further acute PT needs identified. Pt appears to be close to his functional baseline. Presents with mildly decreased cardiopulmonary endurance. Ambulating 300 ft with no assistive device without physical difficulty. HR in 70's, SpO2 97% on RA. Education provided regarding generalized walking program. All education has been completed and the patient has no further questions. No follow-up Physical Therapy or equipment needs. PT is signing off. Thank you for this referral.      Recommendations for follow up therapy are one component of a multi-disciplinary discharge planning process, led by the attending physician.  Recommendations may be updated based on patient status, additional functional criteria and insurance authorization.  Follow Up Recommendations No PT follow up      Assistance Recommended at Discharge PRN  Patient can return home with the following  Assist for transportation    Equipment Recommendations None recommended by PT  Recommendations for Other Services       Functional Status Assessment Patient has had a recent decline in their functional status and demonstrates the ability to make significant improvements in function in a reasonable and predictable amount of time.     Precautions / Restrictions Precautions Precautions: None Restrictions Weight Bearing Restrictions: No      Mobility  Bed Mobility Overal bed mobility: Independent                  Transfers Overall transfer level: Independent Equipment used:  None                    Ambulation/Gait Ambulation/Gait assistance: Modified independent (Device/Increase time) Gait Distance (Feet): 300 Feet Assistive device: None Gait Pattern/deviations: Step-through pattern, Decreased stride length Gait velocity: decreased     General Gait Details: Pt reaching intermittently for wall railing for single UE support, no gross imbalance noted, slow and steady pace  Careers information officer    Modified Rankin (Stroke Patients Only)       Balance Overall balance assessment: Mild deficits observed, not formally tested                                           Pertinent Vitals/Pain Pain Assessment Pain Assessment: No/denies pain    Home Living Family/patient expects to be discharged to:: Private residence Living Arrangements: Spouse/significant other Available Help at Discharge: Family Type of Home: House Home Access: Level entry     Alternate Level Stairs-Number of Steps: 6 Home Layout: Multi-level        Prior Function Prior Level of Function : Independent/Modified Independent;Driving                     Hand Dominance        Extremity/Trunk Assessment   Upper Extremity Assessment Upper Extremity Assessment: Overall WFL for tasks assessed    Lower Extremity Assessment Lower Extremity Assessment: Overall WFL for tasks assessed  Cervical / Trunk Assessment Cervical / Trunk Assessment: Normal  Communication   Communication: No difficulties  Cognition Arousal/Alertness: Awake/alert Behavior During Therapy: WFL for tasks assessed/performed Overall Cognitive Status: Within Functional Limits for tasks assessed                                          General Comments      Exercises     Assessment/Plan    PT Assessment Patient does not need any further PT services  PT Problem List         PT Treatment Interventions      PT Goals (Current  goals can be found in the Care Plan section)  Acute Rehab PT Goals Patient Stated Goal: take granddaughter to school and not get winded PT Goal Formulation: All assessment and education complete, DC therapy    Frequency       Co-evaluation               AM-PAC PT "6 Clicks" Mobility  Outcome Measure Help needed turning from your back to your side while in a flat bed without using bedrails?: None Help needed moving from lying on your back to sitting on the side of a flat bed without using bedrails?: None Help needed moving to and from a bed to a chair (including a wheelchair)?: None Help needed standing up from a chair using your arms (e.g., wheelchair or bedside chair)?: None Help needed to walk in hospital room?: None Help needed climbing 3-5 steps with a railing? : A Little 6 Click Score: 23    End of Session   Activity Tolerance: Patient tolerated treatment well Patient left: in bed;with call bell/phone within reach Nurse Communication: Mobility status PT Visit Diagnosis: Difficulty in walking, not elsewhere classified (R26.2)    Time: 2761-4709 PT Time Calculation (min) (ACUTE ONLY): 17 min   Charges:   PT Evaluation $PT Eval Low Complexity: 1 Low          Lillia Pauls, PT, DPT Acute Rehabilitation Services Office [phone]   Cody Small 07/31/2022, 2:24 PM

## 2022-07-31 NOTE — Progress Notes (Signed)
PROGRESS NOTE    Cody Small  ZYS:[id number] DOB: [birth year] DOA: 07/26/2022 PCP: Charolett Bumpers, PA-C   48 /M w/DM2, HTN, HLD, HFrEF with EF 40-45% CAD s/p stent, CVA, presented to ED with c/o 2 day h/o SOB, DOE, orthopnea. In the ED, BP elevated, BNP >3000, troponin 40, 40. Hb 10.9, CXr w/ interstitial edema -Improved with diuretics, echo noted drop in EF down to 25-30% -Cards consulting, plan for LHC/RHC on Monday -12/10 night became febrile, tested positive for COVID -Cath 12/11 with stable CAD  Subjective: Feels well, denies any complaints, mild cough, denies dyspnea  Assessment and Plan:  Acute on chronic systolic CHF Pulm HTN -known ICM EF 45-50% -Repeat echo with EF down to 25-30%, grade 3 DD, severe inferolateral hypokinesis -Diuresed with IV Lasix, he is 5.9 L negative, clinically euvolemic, -Cath yesterday with stable CAD -Continue metoprolol, increase Aldactone, add Jardiance -Cards following, BMP in a.m.  SARS COVID-19 infection -Febrile 12/10-11, rare cough, stable without hypoxia -Now asymptomatic, monitor without meds  Stage 3b chronic kidney disease (CKD) (HCC) Looks like baseline creat ~1.6 -Now improving, close to baseline -Holding diuretics today  CAD  -S/P-DES to circumflex on 04/2020  -Cont ASA, statin, BB -Cath 12/11 with stable disease  HLD (hyperlipidemia) Cont statin.  DM2 (diabetes mellitus, type 2) (HCC) -Diet controlled, A1c was 6.3 in November  H/o CVA -Continue aspirin, statin   DVT prophylaxis: lovenox Code Status: Full Code Family Communication: None present, left message for daughter yesterday Disposition Plan: Home tomorrow  Consultants: Cards   Procedures:   Antimicrobials:    Objective: Vitals:   07/30/22 2057 07/31/22 0534 07/31/22 0757 07/31/22 0911  BP: 119/74 117/60  106/70  Pulse: 70 63  68  Resp: 18 20  18   Temp: 99.9 F (37.7 C) 99.4 F (37.4 C)    TempSrc: Oral Oral    SpO2: 96% 96% 91% 93%  Weight:   67.5 kg    Height:        Intake/Output Summary (Last 24 hours) at 07/31/2022 1203 Last data filed at 07/31/2022 14/07/2022 Gross per 24 hour  Intake 68.23 ml  Output 700 ml  Net -631.77 ml   Filed Weights   07/29/22 2003 07/30/22 0631 07/31/22 0534  Weight: 65.4 kg 66.9 kg 67.5 kg    Examination:  Pleasant thinly built male sitting up in bed, AAOx3, no distress HEENT: No JVD CVS: S1-S2, regular rhythm Lungs: Clear bilaterally Abdomen: Soft, nontender, bowel sounds present Extremities: No edema   Data Reviewed:   CBC: Recent Labs  Lab 07/26/22 1809 07/29/22 0147 07/30/22 0257 07/30/22 1155 07/30/22 1159 07/31/22 0140  WBC 7.9 10.4 8.9  --   --  8.5  NEUTROABS 5.0  --  4.9  --   --   --   HGB 10.9* 13.2 13.0 13.9  13.9 13.9 12.7*  HCT 32.4* 39.7 38.3* 41.0  41.0 41.0 36.2*  MCV 85.5 85.6 84.4  --   --  83.2  PLT 240 273 220  --   --  212   Basic Metabolic Panel: Recent Labs  Lab 07/26/22 1844 07/28/22 0137 07/29/22 0147 07/30/22 0257 07/30/22 1155 07/30/22 1159 07/31/22 0140  NA 136 136 134* 130* 137  136 135 130*  K 3.5 3.5 3.5 3.7 4.1  4.1 4.1 4.0  CL 109 103 99 96*  --   --  97*  CO2 18* 24 24 24   --   --  23  GLUCOSE 109* 111* 129* 104*  --   --  107*  BUN 24* 26* 35* 36*  --   --  42*  CREATININE 1.81* 1.69* 1.73* 1.77*  --   --  1.66*  CALCIUM 8.7* 8.9 8.8* 8.3*  --   --  8.2*   GFR: Estimated Creatinine Clearance: 35.9 mL/min (A) (by C-G formula based on SCr of 1.66 mg/dL (H)). Liver Function Tests: No results for input(s): "AST", "ALT", "ALKPHOS", "BILITOT", "PROT", "ALBUMIN" in the last 168 hours. No results for input(s): "LIPASE", "AMYLASE" in the last 168 hours. No results for input(s): "AMMONIA" in the last 168 hours. Coagulation Profile: No results for input(s): "INR", "PROTIME" in the last 168 hours. Cardiac Enzymes: No results for input(s): "CKTOTAL", "CKMB", "CKMBINDEX", "TROPONINI" in the last 168 hours. BNP (last 3 results) No  results for input(s): "PROBNP" in the last 8760 hours. HbA1C: No results for input(s): "HGBA1C" in the last 72 hours.  CBG: Recent Labs  Lab 07/30/22 1234 07/30/22 1607 07/30/22 2243 07/31/22 0818 07/31/22 1120  GLUCAP 91 125* 106* 93 106*   Lipid Profile: Recent Labs    07/29/22 0147  CHOL 127  HDL 40*  LDLCALC 72  TRIG 77  CHOLHDL 3.2   Thyroid Function Tests: No results for input(s): "TSH", "T4TOTAL", "FREET4", "T3FREE", "THYROIDAB" in the last 72 hours. Anemia Panel: No results for input(s): "VITAMINB12", "FOLATE", "FERRITIN", "TIBC", "IRON", "RETICCTPCT" in the last 72 hours. Urine analysis: No results found for: "COLORURINE", "APPEARANCEUR", "LABSPEC", "PHURINE", "GLUCOSEU", "HGBUR", "BILIRUBINUR", "KETONESUR", "PROTEINUR", "UROBILINOGEN", "NITRITE", "LEUKOCYTESUR" Sepsis Labs: @LABRCNTIP (procalcitonin:4,lacticidven:4)  ) Recent Results (from the past 240 hour(s))  Resp Panel by RT-PCR (Flu A&B, Covid) Anterior Nasal Swab     Status: None   Collection Time: 07/26/22  6:09 PM   Specimen: Anterior Nasal Swab  Result Value Ref Range Status   SARS Coronavirus 2 by RT PCR NEGATIVE NEGATIVE Final    Comment: (NOTE) SARS-CoV-2 target nucleic acids are NOT DETECTED.  The SARS-CoV-2 RNA is generally detectable in upper respiratory specimens during the acute phase of infection. The lowest concentration of SARS-CoV-2 viral copies this assay can detect is 138 copies/mL. A negative result does not preclude SARS-Cov-2 infection and should not be used as the sole basis for treatment or other patient management decisions. A negative result may occur with  improper specimen collection/handling, submission of specimen other than nasopharyngeal swab, presence of viral mutation(s) within the areas targeted by this assay, and inadequate number of viral copies(<138 copies/mL). A negative result must be combined with clinical observations, patient history, and  epidemiological information. The expected result is Negative.  Fact Sheet for Patients:  EntrepreneurPulse.com.au  Fact Sheet for Healthcare Providers:  IncredibleEmployment.be  This test is no t yet approved or cleared by the [place] FDA and  has been authorized for detection and/or diagnosis of SARS-CoV-2 by FDA under an Emergency Use Authorization (EUA). This EUA will remain  in effect (meaning this test can be used) for the duration of the COVID-19 declaration under Section 564(b)(1) of the Act, 21 U.S.C.section 360bbb-3(b)(1), unless the authorization is terminated  or revoked sooner.       Influenza A by PCR NEGATIVE NEGATIVE Final   Influenza B by PCR NEGATIVE NEGATIVE Final    Comment: (NOTE) The Xpert Xpress SARS-CoV-2/FLU/RSV plus assay is intended as an aid in the diagnosis of influenza from Nasopharyngeal swab specimens and should not be used as a sole basis for treatment. Nasal washings and aspirates are unacceptable for Xpert Xpress SARS-CoV-2/FLU/RSV testing.  Fact Sheet for Patients: EntrepreneurPulse.com.au  Fact Sheet for Healthcare Providers: IncredibleEmployment.be  This test is not yet approved or cleared by the [place] FDA and has been authorized for detection and/or diagnosis of SARS-CoV-2 by FDA under an Emergency Use Authorization (EUA). This EUA will remain in effect (meaning this test can be used) for the duration of the COVID-19 declaration under Section 564(b)(1) of the Act, 21 U.S.C. section 360bbb-3(b)(1), unless the authorization is terminated or revoked.  Performed at [facility], [address] [place], [place] [address]   SARS Coronavirus 2 by RT PCR (hospital order, performed in [facility] hospital lab) *cepheid single result test* Anterior Nasal Swab     Status: Abnormal   Collection Time: 07/29/22 10:41 PM   Specimen: Anterior Nasal Swab   Result Value Ref Range Status   SARS Coronavirus 2 by RT PCR POSITIVE (A) NEGATIVE Final    Comment: (NOTE) SARS-CoV-2 target nucleic acids are DETECTED  SARS-CoV-2 RNA is generally detectable in upper respiratory specimens  during the acute phase of infection.  Positive results are indicative  of the presence of the identified virus, but do not rule out bacterial infection or co-infection with other pathogens not detected by the test.  Clinical correlation with patient history and  other diagnostic information is necessary to determine patient infection status.  The expected result is negative.  Fact Sheet for Patients:   https://www.patel.info/   Fact Sheet for Healthcare Providers:   https://hall.com/    This test is not yet approved or cleared by the [place] FDA and  has been authorized for detection and/or diagnosis of SARS-CoV-2 by FDA under an Emergency Use Authorization (EUA).  This EUA will remain in effect (meaning this test can be used) for the duration of  the COVID-19 declaration under Section 564(b)(1)  of the Act, 21 U.S.C. section 360-bbb-3(b)(1), unless the authorization is terminated or revoked sooner.   Performed at [facility], [place] [address] [place], [place] [address]      Radiology Studies: CARDIAC CATHETERIZATION  Result Date: 07/30/2022   2nd Diag lesion is 50% stenosed.   Prox Cx to Mid Cx lesion is 10% stenosed. Mild coronary artery disease with ostial 50% second diagonal stenosis in a large LAD system; patent left circumflex stent with minimal mid irregularity of 10%; and normal RCA. No significant right heart pressure elevation with mild PA systolic elevation at 36 mm without without evidence for elevated pulmonary artery mean pressure. LVEDP 5 mmHg. RECOMMENDATION: Guideline directed medical therapy for reduced EF at 25 to 30% on echocardiographic evaluation.     Scheduled Meds:  aspirin EC   81 mg Oral Daily   atorvastatin  80 mg Oral Daily   docusate sodium  200 mg Oral Daily   empagliflozin  10 mg Oral Daily   enoxaparin (LOVENOX) injection  40 mg Subcutaneous Q24H   furosemide  40 mg Oral Daily   metoprolol succinate  25 mg Oral Daily   mometasone-formoterol  2 puff Inhalation BID   sacubitril-valsartan  1 tablet Oral BID   sodium chloride flush  3 mL Intravenous Q12H   sodium chloride flush  3 mL Intravenous Q12H   sodium chloride flush  3 mL Intravenous Q12H   [START ON 08/01/2022] spironolactone  12.5 mg Oral Daily   Continuous Infusions:  sodium chloride     sodium chloride       LOS: 3 days    Time spent: 54min    Domenic Polite, MD [facility]   07/31/2022, 12:03 PM

## 2022-07-31 NOTE — Progress Notes (Signed)
   Heart Failure Stewardship Pharmacist Progress Note   PCP: Charolett Bumpers, PA-C PCP-Cardiologist: None    HPI:  76 yo M with PMH of CAD s/p PCI in 2021 to LCx, CHF (40-45%), HTN, CKD III, T2DM, and asthma.  He presented to the ED on 12/7 with shortness of breath, orthopnea, and mild nonproductive cough. CXR suggestive of mild interstitial edema or pneumonia. ECHO 12/8 showed LVEF reduced to 25-30%, regional wall motion abnormalities, G3DD, severe hypokinesis of entire inferolateral wall, moderately reduced RV. R/LHC on 12/11 showed mild CAD and no significantly elevated right heart pressures (RA 1, OA 17, wedge 16, CO 4.4, CI 2.5).  Current HF Medications: Diuretic: furosemide 40 mg PO daily Beta Blocker: metoprolol XL 25 mg daily MRA: spironolactone 25 mg daily SGLT2i: Jardiance 10 mg daily  Prior to admission HF Medications: Diuretic: torsemide 20 mg MWF Beta blocker: metoprolol XL 25 mg daily ACE/ARB/ARNI: lisinopril 5 mg daily MRA: spironolactone 12.5 mg daily  Pertinent Lab Values: Serum creatinine 1.66, BUN 42, Potassium 4.0, Sodium 130, BNP 3067.7   Vital Signs: Weight: 148 lbs (admission weight: 156 lbs) Blood pressure: 110/70s  Heart rate: 60s  I/O: -0.3L yesterday; net -5.9L  Medication Assistance / Insurance Benefits Check: Does the patient have prescription insurance?  Yes Type of insurance plan: [employer]  Outpatient Pharmacy:  Prior to admission outpatient [facility] Is the patient willing to use [facility] TOC pharmacy at discharge? Yes Is the patient willing to transition their outpatient pharmacy to utilize a [facility] outpatient pharmacy?   Pending    Assessment: 1. Acute on chronic systolic and diastolic CHF (LVEF 25-30%), due to ICM. R/LHC scheduled for 12/11. NYHA class III symptoms. - Continue furosemide 40 mg PO daily. Strict I/Os and daily weights. Keep K>4. - Continue metoprolol XL 25 mg daily - Consider adding Entresto tomorrow if BP  and renal function stable - Agree with increasing to spironolactone 25 mg daily - Agree with adding Jardiance 10 mg daily  Plan: 1) Medication changes recommended at this time: - Agree with changes  2) Patient assistance: - Entresto copay $10.35 - Farxiga/Jardiance copay $10.35  3)  Education  - Patient has been educated on current HF medications and potential additions to HF medication regimen - Patient verbalizes understanding that over the next few months, these medication doses may change and more medications may be added to optimize HF regimen - Patient has been educated on basic disease state pathophysiology and goals of therapy   Sharen Hones, PharmD, BCPS Heart Failure Stewardship Pharmacist Phone [phone]

## 2022-07-31 NOTE — Care Management Important Message (Signed)
Important Message  Patient Details  Name: Cody Small MRN: [id number] Date of Birth: May 22, 1946   Medicare Important Message Given:  Yes     Renie Ora 07/31/2022, 9:14 AM

## 2022-07-31 NOTE — Discharge Summary (Incomplete)
Physician Discharge Summary  Cody Small [id number] DOB: November 20, 1945 DOA: 07/26/2022  PCP: Rolland Porter, PA-C  Admit date: 07/26/2022 Discharge date: 07/31/2022  Time spent: 45 minutes  Recommendations for Outpatient Follow-up:  CHF ToC clinic in 2weeks, please check BMP at FU   Discharge Diagnoses:  Principal Problem:   Acute on chronic systolic CHF (congestive heart failure) ([place]) Active Problems:   Stage 3b chronic kidney disease (CKD) ([place])   DM2 (diabetes mellitus, type 2) ([place])   HLD (hyperlipidemia)   CAD S/P percutaneous coronary angioplasty   Acute on chronic diastolic CHF (congestive heart failure) ([place])   Discharge Condition: stable  Diet recommendation: low sodium  Filed Weights   07/29/22 2003 07/30/22 0631 07/31/22 0534  Weight: 65.4 kg 66.9 kg 67.5 kg    History of present illness:  16 /M w/DM2, HTN, HLD, HFrEF with EF 40-45% CAD s/p stent, CVA, presented to ED with c/o 2 day h/o SOB, DOE, orthopnea. In the ED, BP elevated, BNP >3000, troponin 40, 40. 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   SARS COVID-19 infection -Febrile overnight, rare cough, stable without hypoxia -Monitor clinically, will consider molnupiravir if he reports mild symptoms   Stage 3b chronic kidney disease (CKD) ([place]) Looks like baseline creat ~1.6 -Now improving, close to baseline -Holding further diuretics today   CAD  -S/P-DES to circumflex on 04/2020  -Cont ASA, statin, BB   HLD (hyperlipidemia) Cont statin.   DM2 (diabetes mellitus, type 2)  ([facility]) -Diet controlled, A1c was 6.3 in November   H/o CVA -Continue aspirin, statin  Procedures: CARDIAC CATHETERIZATION   2nd Diag lesion is 50% stenosed.   Prox Cx to Mid Cx lesion is 10% stenosed. Mild coronary artery disease with ostial 50% second diagonal stenosis in a large LAD system; patent left circumflex stent with minimal mid irregularity of 10%; and normal RCA. No significant right heart pressure elevation with mild PA systolic elevation at 36 mm without without evidence for elevated pulmonary artery mean pressure. LVEDP 5 mmHg. RECOMMENDATION: Guideline directed medical therapy for reduced EF at 25 to 30% on echocardiographic evaluation.   Consultations: Cards  Discharge Exam: Vitals:   07/31/22 0757 07/31/22 0911  BP:  106/70  Pulse:  68  Resp:  18  Temp:    SpO2: 91% 93%    Gen: Awake, Alert, Oriented X 3,  HEENT: no JVD Lungs: Good air movement bilaterally, CTAB CVS: S1S2/RRR Abd: soft, Non tender, non distended, BS present Extremities: No edema Skin: no new rashes on exposed skin   Discharge Instructions    Allergies as of 07/31/2022   No Known Allergies   Med Rec must be completed prior to using this [place]***      No Known Allergies  Follow-up Information     [facility]. Go in 24 day(s).   Specialty: Cardiology Why: Hospital follow up on 08/21/2022 @ 11 am.  PLEASE bring a current medication list to appointment FREE valet parking, Entrance C, [place] information: [address] [id number] [place] [phone]  The results of significant diagnostics from this hospitalization (including imaging, microbiology, ancillary and laboratory) are listed below for reference.    Significant Diagnostic Studies:  ECHOCARDIOGRAM COMPLETE  Result Date: 07/27/2022    ECHOCARDIOGRAM REPORT   Patient Name:   Cody KingfisherJames Small Date of  Exam: 07/27/2022 Medical Rec #:  [id number]   Height:       67.0 in Accession #:    [id number]  Weight:       156.3 lb Date of Birth:  06/09/1946    BSA:          1.821 m Patient Age:    76 years    BP:           149/91 mmHg Patient Gender: M           HR:           60 bpm. Exam Location:  Inpatient Procedure: 2D Echo and Intracardiac Opacification Agent Indications:    CHF  History:        Patient has no prior history of Echocardiogram examinations.  Sonographer:    Cathie HoopsWendy Porter Referring Phys: Heywood IlesJARED M GARDNER IMPRESSIONS  1. Left ventricular ejection fraction, by estimation, is 25 to 30%. The left ventricle has severely decreased function. The left ventricle demonstrates regional wall motion abnormalities (see scoring diagram/findings for description). The left ventricular internal cavity size was mildly to moderately dilated. Left ventricular diastolic parameters are consistent with Grade III diastolic dysfunction (restrictive). Elevated left ventricular end-diastolic pressure. There is severe hypokinesis of the left ventricular, entire inferolateral wall.  2. Right ventricular systolic function is moderately reduced. The right ventricular size is normal. There is moderately elevated pulmonary artery systolic pressure. The estimated right ventricular systolic pressure is 58.1 mmHg.  3. Right atrial size was mildly dilated.  4. Large pleural effusion in both left and right lateral regions.  5. The mitral valve is normal in structure. Mild to moderate mitral valve regurgitation. No evidence of mitral stenosis.  6. The aortic valve is tricuspid. There is mild calcification of the aortic valve. There is mild thickening of the aortic valve. Aortic valve regurgitation is mild. Aortic valve sclerosis/calcification is present, without any evidence of aortic stenosis. Aortic regurgitation PHT measures 609 msec. Aortic valve mean gradient measures 4.5 mmHg.  7. The inferior vena cava is normal in size with <50% respiratory  variability, suggesting right atrial pressure of 8 mmHg. Comparison(s): Prior images unable to be directly viewed, comparison made by report only. Echo report from [facility] 05/05/21: EF 40-45%, hypokinesis of inferolateral wall, moderate pulmonary hypertension. Conclusion(s)/Recommendation(s): Severely reduced LVEF, worse compared to prior report. Findings communicated to Dr. Jomarie LongsJoseph. FINDINGS  Left Ventricle: GLobal hypokinesis with severe hypokinesis of inferolateral wall. Echo contrast shows trabeculation but no evidence of LV thrombus. Left ventricular ejection fraction, by estimation, is 25 to 30%. The left ventricle has severely decreased function. The left ventricle demonstrates regional wall motion abnormalities. Severe hypokinesis of the left ventricular, entire inferolateral wall. Definity contrast agent was given IV to delineate the left ventricular endocardial borders. The  left ventricular internal cavity size was mildly to moderately dilated. There is no left ventricular hypertrophy. Left ventricular diastolic parameters are consistent with Grade III diastolic dysfunction (restrictive). Elevated left ventricular end-diastolic pressure. Right Ventricle: The right ventricular size is normal. Right vetricular wall thickness was not well visualized. Right ventricular systolic function is moderately reduced. There is moderately elevated pulmonary artery systolic pressure. The tricuspid regurgitant velocity is 3.54 m/s, and with an  assumed right atrial pressure of 8 mmHg, the estimated right ventricular systolic pressure is 58.1 mmHg. Left [facility]: Left atrial size was normal in size. Right [facility]: Right atrial size was mildly dilated. Pericardium: There is no evidence of pericardial effusion. Mitral Valve: The mitral valve is normal in structure. Mild to moderate mitral valve regurgitation. No evidence of mitral valve stenosis. Tricuspid Valve: The tricuspid valve is normal in structure. Tricuspid valve  regurgitation is mild . No evidence of tricuspid stenosis. Aortic Valve: The aortic valve is tricuspid. There is mild calcification of the aortic valve. There is mild thickening of the aortic valve. Aortic valve regurgitation is mild. Aortic regurgitation PHT measures 609 msec. Aortic valve sclerosis/calcification is present, without any evidence of aortic stenosis. Aortic valve mean gradient measures 4.5 mmHg. Aortic valve peak gradient measures 9.1 mmHg. Aortic valve area, by VTI measures 1.37 cm. Pulmonic Valve: The pulmonic valve was grossly normal. Pulmonic valve regurgitation is mild. No evidence of pulmonic stenosis. Aorta: The aortic root, ascending aorta, aortic arch and descending aorta are all structurally normal, with no evidence of dilitation or obstruction. Venous: The inferior vena cava is normal in size with less than 50% respiratory variability, suggesting right atrial pressure of 8 mmHg. IAS/Shunts: The atrial septum is grossly normal. Additional Comments: There is a large pleural effusion in both left and right lateral regions.  LEFT VENTRICLE PLAX 2D LVIDd:         5.60 cm      Diastology LVIDs:         5.00 cm      LV e' medial:    3.38 cm/s LV PW:         0.90 cm      LV E/e' medial:  32.0 LV IVS:        0.90 cm      LV e' lateral:   3.60 cm/s LVOT diam:     2.00 cm      LV E/e' lateral: 30.0 LV SV:         47 LV SV Index:   26 LVOT Area:     3.14 cm  LV Volumes (MOD) LV vol d, MOD A2C: 142.0 ml LV vol d, MOD A4C: 124.5 ml LV vol s, MOD A2C: 102.3 ml LV vol s, MOD A4C: 87.2 ml LV SV MOD A2C:     39.7 ml LV SV MOD A4C:     124.5 ml LV SV MOD BP:      36.9 ml RIGHT VENTRICLE RV Basal diam:  3.60 cm RV Mid diam:    3.40 cm RV S prime:     8.78 cm/s TAPSE (M-mode): 1.1 cm LEFT ATRIUM             Index        RIGHT ATRIUM           Index LA diam:        3.50 cm 1.92 cm/m   RA Area:     14.00 cm LA Vol (A2C):   47.0 ml 25.81 ml/m  RA Volume:   41.10 ml  22.57 ml/m LA Vol (A4C):   41.4 ml 22.73  ml/m LA Biplane Vol: 44.7 ml 24.55 ml/m  AORTIC VALVE                    PULMONIC VALVE AV Area (Vmax):    1.68 cm     PV Vmax:  0.84 m/s AV Area (Vmean):   1.49 cm     PV Peak grad:     2.8 mmHg AV Area (VTI):     1.37 cm     PR End Diast Vel: 14.59 msec AV Vmax:           150.50 cm/s AV Vmean:          98.800 cm/s AV VTI:            0.344 m AV Peak Grad:      9.1 mmHg AV Mean Grad:      4.5 mmHg LVOT Vmax:         80.60 cm/s LVOT Vmean:        47.000 cm/s LVOT VTI:          0.150 m LVOT/AV VTI ratio: 0.44 AI PHT:            609 msec  AORTA Ao Root diam: 3.00 cm Ao Asc diam:  2.70 cm MITRAL VALVE                TRICUSPID VALVE MV Area (PHT): 4.80 cm     TR Peak grad:   50.1 mmHg MV Decel Time: 158 msec     TR Vmax:        354.00 cm/s MR Peak grad: 76.9 mmHg MR Vmax:      438.33 cm/s   SHUNTS MV E velocity: 108.00 cm/s  Systemic VTI:  0.15 m MV A velocity: 27.90 cm/s   Systemic Diam: 2.00 cm MV E/A ratio:  3.87 Buford Dresser MD Electronically signed by Buford Dresser MD Signature Date/Time: 07/27/2022/3:44:12 PM    Final    DG Chest 2 View  Result Date: 07/26/2022 CLINICAL DATA:  Shortness of breath EXAM: CHEST - 2 VIEW COMPARISON:  Previous studies including the examination of 05/25/2014 FINDINGS: Transverse diameter of heart is slightly increased. Central pulmonary vessels are prominent. There is subtle increase in interstitial markings in parahilar regions and lower lung fields. There is no focal consolidation. There is minimal blunting of right lateral CP angle, possibly suggesting minimal pleural effusion. There is no pneumothorax. IMPRESSION: The subtle increase in interstitial markings in parahilar regions and lower lung fields suggesting mild interstitial edema or interstitial pneumonia. There is no focal pulmonary consolidation. Electronically Signed   By: Elmer Picker M.D.   On: 07/26/2022 16:44    Microbiology: Recent Results (from the past 240 hour(s))  Resp  Panel by RT-PCR (Flu A&B, Covid) Anterior Nasal Swab     Status: None   Collection Time: 07/26/22  6:09 PM   Specimen: Anterior Nasal Swab  Result Value Ref Range Status   SARS Coronavirus 2 by RT PCR NEGATIVE NEGATIVE Final    Comment: (NOTE) SARS-CoV-2 target nucleic acids are NOT DETECTED.  The SARS-CoV-2 RNA is generally detectable in upper respiratory specimens during the acute phase of infection. The lowest concentration of SARS-CoV-2 viral copies this assay can detect is 138 copies/mL. A negative result does not preclude SARS-Cov-2 infection and should not be used as the sole basis for treatment or other patient management decisions. A negative result may occur with  improper specimen collection/handling, submission of specimen other than nasopharyngeal swab, presence of viral mutation(s) within the areas targeted by this assay, and inadequate number of viral copies(<138 copies/mL). A negative result must be combined with clinical observations, patient history, and epidemiological information. The expected result is Negative.  Fact Sheet for Patients:  EntrepreneurPulse.com.au  Fact Sheet  for Healthcare Providers:  IncredibleEmployment.be  This test is no t yet approved or cleared by the [place] and  has been authorized for detection and/or diagnosis of SARS-CoV-2 by FDA under an Emergency Use Authorization (EUA). This EUA will remain  in effect (meaning this test can be used) for the duration of the COVID-19 declaration under Section 564(b)(1) of the Act, 21 U.S.C.section 360bbb-3(b)(1), unless the authorization is terminated  or revoked sooner.       Influenza A by PCR NEGATIVE NEGATIVE Final   Influenza B by PCR NEGATIVE NEGATIVE Final    Comment: (NOTE) The Xpert Xpress SARS-CoV-2/FLU/RSV plus assay is intended as an aid in the diagnosis of influenza from Nasopharyngeal swab specimens and should not be used as a sole  basis for treatment. Nasal washings and aspirates are unacceptable for Xpert Xpress SARS-CoV-2/FLU/RSV testing.  Fact Sheet for Patients: EntrepreneurPulse.com.au  Fact Sheet for Healthcare Providers: IncredibleEmployment.be  This test is not yet approved or cleared by the [place] FDA and has been authorized for detection and/or diagnosis of SARS-CoV-2 by FDA under an Emergency Use Authorization (EUA). This EUA will remain in effect (meaning this test can be used) for the duration of the COVID-19 declaration under Section 564(b)(1) of the Act, 21 U.S.C. section 360bbb-3(b)(1), unless the authorization is terminated or revoked.  Performed at [facility], [address] [place], [place] [address]   SARS Coronavirus 2 by RT PCR (hospital order, performed in [facility] hospital lab) *cepheid single result test* Anterior Nasal Swab     Status: Abnormal   Collection Time: 07/29/22 10:41 PM   Specimen: Anterior Nasal Swab  Result Value Ref Range Status   SARS Coronavirus 2 by RT PCR POSITIVE (A) NEGATIVE Final    Comment: (NOTE) SARS-CoV-2 target nucleic acids are DETECTED  SARS-CoV-2 RNA is generally detectable in upper respiratory specimens  during the acute phase of infection.  Positive results are indicative  of the presence of the identified virus, but do not rule out bacterial infection or co-infection with other pathogens not detected by the test.  Clinical correlation with patient history and  other diagnostic information is necessary to determine patient infection status.  The expected result is negative.  Fact Sheet for Patients:   https://www.patel.info/   Fact Sheet for Healthcare Providers:   https://hall.com/    This test is not yet approved or cleared by the [place] FDA and  has been authorized for detection and/or diagnosis of SARS-CoV-2 by FDA under an  Emergency Use Authorization (EUA).  This EUA will remain in effect (meaning this test can be used) for the duration of  the COVID-19 declaration under Section 564(b)(1)  of the Act, 21 U.S.C. section 360-bbb-3(b)(1), unless the authorization is terminated or revoked sooner.   Performed at [facility], [place] [address], [place], [place] [address]      Labs: Basic Metabolic Panel: Recent Labs  Lab 07/26/22 1844 07/28/22 0137 07/29/22 0147 07/30/22 0257 07/30/22 1155 07/30/22 1159 07/31/22 0140  NA 136 136 134* 130* 137  136 135 130*  K 3.5 3.5 3.5 3.7 4.1  4.1 4.1 4.0  CL 109 103 99 96*  --   --  97*  CO2 18* 24 24 24   --   --  23  GLUCOSE 109* 111* 129* 104*  --   --  107*  BUN 24* 26* 35* 36*  --   --  42*  CREATININE 1.81* 1.69* 1.73*  1.77*  --   --  1.66*  CALCIUM 8.7* 8.9 8.8* 8.3*  --   --  8.2*   Liver Function Tests: No results for input(s): "AST", "ALT", "ALKPHOS", "BILITOT", "PROT", "ALBUMIN" in the last 168 hours. No results for input(s): "LIPASE", "AMYLASE" in the last 168 hours. No results for input(s): "AMMONIA" in the last 168 hours. CBC: Recent Labs  Lab 07/26/22 1809 07/29/22 0147 07/30/22 0257 07/30/22 1155 07/30/22 1159 07/31/22 0140  WBC 7.9 10.4 8.9  --   --  8.5  NEUTROABS 5.0  --  4.9  --   --   --   HGB 10.9* 13.2 13.0 13.9  13.9 13.9 12.7*  HCT 32.4* 39.7 38.3* 41.0  41.0 41.0 36.2*  MCV 85.5 85.6 84.4  --   --  83.2  PLT 240 273 220  --   --  212   Cardiac Enzymes: No results for input(s): "CKTOTAL", "CKMB", "CKMBINDEX", "TROPONINI" in the last 168 hours. BNP: BNP (last 3 results) Recent Labs    07/26/22 1844  BNP 3,067.7*    ProBNP (last 3 results) No results for input(s): "PROBNP" in the last 8760 hours.  CBG: Recent Labs  Lab 07/30/22 0825 07/30/22 1234 07/30/22 1607 07/30/22 2243 07/31/22 0818  GLUCAP 106* 91 125* 106* 93       Signed:  Domenic Polite MD.  [facility] 07/31/2022, 9:34 AM

## 2022-07-31 NOTE — Progress Notes (Signed)
Progress Note  Patient Name: Cody Small Date of Encounter: 07/31/2022  Primary Cardiologist: None   Subjective   Patient seen and examined at his bedside - he was eating breakfast during my visit.  He offers no complaints at this time.  Inpatient Medications    Scheduled Meds:  aspirin EC  81 mg Oral Daily   atorvastatin  80 mg Oral Daily   docusate sodium  200 mg Oral Daily   empagliflozin  10 mg Oral Daily   enoxaparin (LOVENOX) injection  40 mg Subcutaneous Q24H   furosemide  40 mg Oral Daily   metoprolol succinate  25 mg Oral Daily   mometasone-formoterol  2 puff Inhalation BID   sacubitril-valsartan  1 tablet Oral BID   sodium chloride flush  3 mL Intravenous Q12H   sodium chloride flush  3 mL Intravenous Q12H   sodium chloride flush  3 mL Intravenous Q12H   [START ON 08/01/2022] spironolactone  12.5 mg Oral Daily   Continuous Infusions:  sodium chloride     sodium chloride     PRN Meds: sodium chloride, sodium chloride, acetaminophen, ondansetron (ZOFRAN) IV, sodium chloride flush, sodium chloride flush   Vital Signs    Vitals:   07/30/22 2057 07/31/22 0534 07/31/22 0757 07/31/22 0911  BP: 119/74 117/60  106/70  Pulse: 70 63  68  Resp: 18 20  18   Temp: 99.9 F (37.7 C) 99.4 F (37.4 C)    TempSrc: Oral Oral    SpO2: 96% 96% 91% 93%  Weight:  67.5 kg    Height:        Intake/Output Summary (Last 24 hours) at 07/31/2022 1117 Last data filed at 07/31/2022 14/07/2022 Gross per 24 hour  Intake 68.23 ml  Output 700 ml  Net -631.77 ml   Filed Weights   07/29/22 2003 07/30/22 0631 07/31/22 0534  Weight: 65.4 kg 66.9 kg 67.5 kg    Telemetry    Sinus rhythm- Personally Reviewed  ECG    None - Personally Reviewed  Physical Exam    General: Comfortable in bed Head: Atraumatic, normal size  Eyes: PEERLA, EOMI  Neck: Supple, normal JVD Cardiac: Normal S1, S2; RRR; no murmurs, rubs, or gallops Lungs: Clear to auscultation bilaterally Abd: Soft,  nontender, no hepatomegaly  Ext: warm, no edema Musculoskeletal: No deformities, BUE and BLE strength normal and equal Skin: Warm and dry, no rashes   Neuro: Alert and oriented to person, place, time, and situation, CNII-XII grossly intact, no focal deficits  Psych: Normal mood and affect   Labs    Chemistry Recent Labs  Lab 07/29/22 0147 07/30/22 0257 07/30/22 1155 07/30/22 1159 07/31/22 0140  NA 134* 130* 137  136 135 130*  K 3.5 3.7 4.1  4.1 4.1 4.0  CL 99 96*  --   --  97*  CO2 24 24  --   --  23  GLUCOSE 129* 104*  --   --  107*  BUN 35* 36*  --   --  42*  CREATININE 1.73* 1.77*  --   --  1.66*  CALCIUM 8.8* 8.3*  --   --  8.2*  GFRNONAA 41* 40*  --   --  43*  ANIONGAP 11 10  --   --  10     Hematology Recent Labs  Lab 07/29/22 0147 07/30/22 0257 07/30/22 1155 07/30/22 1159 07/31/22 0140  WBC 10.4 8.9  --   --  8.5  RBC 4.64 4.54  --   --  4.35  HGB 13.2 13.0 13.9  13.9 13.9 12.7*  HCT 39.7 38.3* 41.0  41.0 41.0 36.2*  MCV 85.6 84.4  --   --  83.2  MCH 28.4 28.6  --   --  29.2  MCHC 33.2 33.9  --   --  35.1  RDW 16.5* 16.0*  --   --  16.0*  PLT 273 220  --   --  212    Cardiac EnzymesNo results for input(s): "TROPONINI" in the last 168 hours. No results for input(s): "TROPIPOC" in the last 168 hours.   BNP Recent Labs  Lab 07/26/22 1844  BNP 3,067.7*     DDimer No results for input(s): "DDIMER" in the last 168 hours.   Radiology    CARDIAC CATHETERIZATION  Result Date: 07/30/2022   2nd Diag lesion is 50% stenosed.   Prox Cx to Mid Cx lesion is 10% stenosed. Mild coronary artery disease with ostial 50% second diagonal stenosis in a large LAD system; patent left circumflex stent with minimal mid irregularity of 10%; and normal RCA. No significant right heart pressure elevation with mild PA systolic elevation at 36 mm without without evidence for elevated pulmonary artery mean pressure. LVEDP 5 mmHg. RECOMMENDATION: Guideline directed medical  therapy for reduced EF at 25 to 30% on echocardiographic evaluation.    Cardiac Studies   Right and left heart catheterization 07/30/2022   2nd Diag lesion is 50% stenosed.   Prox Cx to Mid Cx lesion is 10% stenosed.   Mild coronary artery disease with ostial 50% second diagonal stenosis in a large LAD system; patent left circumflex stent with minimal mid irregularity of 10%; and normal RCA.   No significant right heart pressure elevation with mild PA systolic elevation at 36 mm without without evidence for elevated pulmonary artery mean pressure.   LVEDP 5 mmHg.   RECOMMENDATION: Guideline directed medical therapy for reduced EF at 25 to 30% on echocardiographic evaluation.   Echocardiogram 07/27/22 1. Left ventricular ejection fraction, by estimation, is 25 to 30%. The left ventricle has severely decreased function. The left ventricle demonstrates regional wall motion abnormalities (see scoring  diagram/findings for description). The left ventricular internal cavity size was mildly to moderately dilated. Left ventricular diastolic parameters are consistent with Grade III diastolic  dysfunction (restrictive). Elevated left ventricular end-diastolic  pressure. There is severe hypokinesis of  the left ventricular, entire inferolateral wall.   2. Right ventricular systolic function is moderately reduced. The right  ventricular size is normal. There is moderately elevated pulmonary artery  systolic pressure. The estimated right ventricular systolic pressure is  58.1 mmHg.   3. Right atrial size was mildly dilated.   4. Large pleural effusion in both left and right lateral regions.   5. The mitral valve is normal in structure. Mild to moderate mitral valve  regurgitation. No evidence of mitral stenosis.   6. The aortic valve is tricuspid. There is mild calcification of the  aortic valve. There is mild thickening of the aortic valve. Aortic valve  regurgitation is mild. Aortic valve  sclerosis/calcification is present,  without any evidence of aortic  stenosis. Aortic regurgitation PHT measures 609 msec. Aortic valve mean  gradient measures 4.5 mmHg.   7. The inferior vena cava is normal in size with <50% respiratory  variability, suggesting right atrial pressure of 8 mmHg.   Comparison(s): Prior images unable to be directly viewed, comparison made  by report only. Echo report from [facility] 05/05/21: EF 40-45%, hypokinesis of  inferolateral wall, moderate pulmonary hypertension.    Patient Profile     76 y.o. 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   Heart cath done yesterday does not show any new worsening disease.  Moderate coronary artery disease will continue medical management with aspirin and statin. Clinically he does not appear to be volume overloaded. He is currently on metoprolol succinate 25 mg daily, spironolactone 12.5 mg daily, would like to add Entresto but his blood pressure is marginal-his spironolactone was increased to 25 I like to cut that back down to 12.5 to hopefully make room for blood pressure to be able to add Entresto. Continue Lipitor 80 mg daily. Also now with COVID-19 but is asymptomatic.  For questions or updates, please contact [facility] Please consult [email or web address] for contact info under Cardiology/STEMI.      Signed, Thomasene Ripple, DO  07/31/2022, 11:17 AM

## 2022-08-01 ENCOUNTER — Other Ambulatory Visit (HOSPITAL_COMMUNITY): Payer: Self-pay

## 2022-08-01 ENCOUNTER — Telehealth (HOSPITAL_COMMUNITY): Payer: Self-pay | Admitting: *Deleted

## 2022-08-01 LAB — BASIC METABOLIC PANEL
Anion gap: 8 (ref 5–15)
BUN: 45 mg/dL — ABNORMAL HIGH (ref 8–23)
CO2: 25 mmol/L (ref 22–32)
Calcium: 8.4 mg/dL — ABNORMAL LOW (ref 8.9–10.3)
Chloride: 99 mmol/L (ref 98–111)
Creatinine, Ser: 1.61 mg/dL — ABNORMAL HIGH (ref 0.61–1.24)
GFR, Estimated: 44 mL/min — ABNORMAL LOW (ref >60)
Glucose, Bld: 125 mg/dL — ABNORMAL HIGH (ref 70–99)
Potassium: 4.1 mmol/L (ref 3.5–5.1)
Sodium: 132 mmol/L — ABNORMAL LOW (ref 135–145)

## 2022-08-01 LAB — LIPOPROTEIN A (LPA): Lipoprotein (a): 34.3 nmol/L — ABNORMAL HIGH (ref <75.0)

## 2022-08-01 MED ORDER — FUROSEMIDE 40 MG PO TABS
40.0000 mg | ORAL_TABLET | Freq: Every day | ORAL | 0 refills | Status: DC
  Filled 2022-08-01: qty 30, 30d supply, fill #0

## 2022-08-01 MED ORDER — SACUBITRIL-VALSARTAN 24-26 MG PO TABS
1.0000 | ORAL_TABLET | Freq: Two times a day (BID) | ORAL | 0 refills | Status: DC
  Filled 2022-08-01: qty 60, 30d supply, fill #0

## 2022-08-01 MED ORDER — EMPAGLIFLOZIN 10 MG PO TABS
10.0000 mg | ORAL_TABLET | Freq: Every day | ORAL | 0 refills | Status: DC
  Filled 2022-08-01: qty 30, 30d supply, fill #0

## 2022-08-01 NOTE — Progress Notes (Signed)
Mobility Specialist - Progress Note   08/01/22 1004  Mobility  Activity Ambulated with assistance in room  Level of Assistance Standby assist, set-up cues, supervision of patient - no hands on  Assistive Device None  Distance Ambulated (ft) 20 ft  Activity Response Tolerated well  Mobility Referral No  $Mobility charge 1 Mobility   Pt received in room. Pt denied hallway ambulation but was agreeable for room ambulation. No complaints during session. Pt was left EOB with all need met.  Amaya Reives  Mobility Specialist Please contact via SecureChat or Rehab office at [phone]  

## 2022-08-01 NOTE — Progress Notes (Signed)
   Heart Failure Stewardship Pharmacist Progress Note   PCP: Charolett Bumpers, PA-C PCP-Cardiologist: None    HPI:  76 yo M with PMH of CAD s/p PCI in 2021 to LCx, CHF (40-45%), HTN, CKD III, T2DM, and asthma.  He presented to the ED on 12/7 with shortness of breath, orthopnea, and mild nonproductive cough. CXR suggestive of mild interstitial edema or pneumonia. ECHO 12/8 showed LVEF reduced to 25-30%, regional wall motion abnormalities, G3DD, severe hypokinesis of entire inferolateral wall, moderately reduced RV. R/LHC on 12/11 showed mild CAD and no significantly elevated right heart pressures (RA 1, OA 17, wedge 16, CO 4.4, CI 2.5).  Discharge HF Medications: Diuretic: furosemide 40 mg PO daily Beta Blocker: metoprolol XL 25 mg daily ACE/ARB/ARNI: Entresto 24/26 mg BID MRA: spironolactone 25 mg daily SGLT2i: Jardiance 10 mg daily  Prior to admission HF Medications: Diuretic: torsemide 20 mg MWF Beta blocker: metoprolol XL 25 mg daily ACE/ARB/ARNI: lisinopril 5 mg daily MRA: spironolactone 12.5 mg daily  Pertinent Lab Values: Serum creatinine 1.61, BUN 45, Potassium 4.1, Sodium 132, BNP 3067.7   Vital Signs: Weight: 141 lbs (admission weight: 156 lbs) Blood pressure: 100-110/70s  Heart rate: 60s  I/O: -0.3L yesterday; net -5.8L  Medication Assistance / Insurance Benefits Check: Does the patient have prescription insurance?  Yes Type of insurance plan: [employer]  Outpatient Pharmacy:  Prior to admission outpatient pharmacy: [facility] Is the patient willing to use [facility] TOC pharmacy at discharge? Yes Is the patient willing to transition their outpatient pharmacy to utilize a [facility] outpatient pharmacy?   Pending    Assessment: 1. Acute on chronic systolic and diastolic CHF (LVEF 25-30%), due to ICM. R/LHC scheduled for 12/11. NYHA class II symptoms. - Continue furosemide 40 mg PO daily. Strict I/Os and daily weights. Keep K>4. - Continue metoprolol XL 25 mg daily -  Agree with adding Entresto 24/26 mg BID - Continue spironolactone 25 mg daily - Continue Jardiance 10 mg daily  Plan: 1) Medication changes recommended at this time: - Agree with changes  2) Patient assistance: - Entresto copay $10.35 - Farxiga/Jardiance copay $10.35  3)  Education  - Patient has been educated on current HF medications and potential additions to HF medication regimen - Patient verbalizes understanding that over the next few months, these medication doses may change and more medications may be added to optimize HF regimen - Patient has been educated on basic disease state pathophysiology and goals of therapy   Sharen Hones, PharmD, BCPS Heart Failure Stewardship Pharmacist Phone [phone]

## 2022-08-01 NOTE — Discharge Summary (Signed)
Physician Discharge Summary  Cody Small [id number] DOB: 07-09-1946 DOA: 07/26/2022  PCP: Rolland Porter, PA-C  Admit date: 07/26/2022 Discharge date: 08/01/2022  Time spent: 45 minutes  Recommendations for Outpatient Follow-up:  CHF TOC clinic in 1 weeks BMP in 1 week   Discharge Diagnoses:  Principal Problem:   Acute on chronic systolic CHF (congestive heart failure) ([place]) Active Problems:   Stage 3b chronic kidney disease (CKD) (HCC)   DM2 (diabetes mellitus, type 2) (HCC)   HLD (hyperlipidemia)   CAD S/P percutaneous coronary angioplasty   Acute on chronic diastolic CHF (congestive heart failure) ([place])   Discharge Condition: Improved  Diet recommendation: Low-sodium, better, heart healthy  Filed Weights   07/30/22 0631 07/31/22 0534 08/01/22 0612  Weight: 66.9 kg 67.5 kg 64.2 kg    History of present illness:  87 /M w/DM2, HTN, HLD, HFrEF with EF 40-45% CAD s/p stent, CVA, presented to ED with c/o 2 day h/o SOB, DOE, orthopnea. In the ED, BP elevated, BNP >3000, troponin 40, 40. 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% -Compliant with meds, admits to dietary indiscretions -Repeat echo with EF down to 25-30%, grade 3 DD, severe inferolateral hypokinesis -Diuresed with IV Lasix, he is 6 L negative, clinically euvolemic, hold further diuretics, cath yesterday with stable nonobstructive coronary disease, started on Jardiance, Entresto, Aldactone, metoprolol continued -CHF TOC clinic follow-up made, needs BMP in 1 week   SARS COVID-19 infection -Asymptomatic, stated in airborne isolation   Stage 3b chronic kidney disease (CKD) ([place]) Looks like baseline creat ~1.6 -Now stable, back in baseline of 1.6, needs BMP in 1 week   CAD  -S/P-DES to circumflex on 04/2020  -Cont ASA, statin, BB -Cath as above with stable disease   HLD (hyperlipidemia) Cont statin.   DM2 (diabetes mellitus,  type 2) (HCC) -Diet controlled, A1c was 6.3 in November   H/o CVA -Continue aspirin, statin   Procedures: CARDIAC CATHETERIZATION   2nd Diag lesion is 50% stenosed.   Prox Cx to Mid Cx lesion is 10% stenosed. Mild coronary artery disease with ostial 50% second diagonal stenosis in a large LAD system; patent left circumflex stent with minimal mid irregularity of 10%; and normal RCA. No significant right heart pressure elevation with mild PA systolic elevation at 36 mm without without evidence for elevated pulmonary artery mean pressure. LVEDP 5 mmHg. RECOMMENDATION: Guideline directed medical therapy for reduced EF at 25 to 30% on echocardiographic evaluation.    Consultations: Cards   Discharge Exam: Vitals:   08/01/22 0741 08/01/22 0814  BP:    Pulse: 73   Resp: 18 18  Temp:  98.4 F (36.9 C)  SpO2: 92%     Gen: Awake, Alert, Oriented X 3,  HEENT: no JVD Lungs: Good air movement bilaterally, CTAB CVS: S1S2/RRR Abd: soft, Non tender, non distended, BS present Extremities: No edema Skin: no new rashes on exposed skin   Discharge Instructions   Discharge Instructions     Diet - low sodium heart healthy   Complete by: As directed    Increase activity slowly   Complete by: As directed       Allergies as of 08/01/2022   No Known Allergies      Medication List     STOP taking these medications    lisinopril 5 MG tablet Commonly known as: ZESTRIL   potassium chloride 10 MEQ tablet Commonly known  as: KLOR-CON   torsemide 20 MG tablet Commonly known as: DEMADEX       TAKE these medications    aspirin EC 81 MG tablet Take 81 mg by mouth daily.   atorvastatin 80 MG tablet Commonly known as: LIPITOR Take 80 mg by mouth every evening.   budesonide-formoterol 160-4.5 MCG/ACT inhaler Commonly known as: SYMBICORT Inhale 2 puffs into the lungs 2 (two) times daily.   clopidogrel 75 MG tablet Commonly known as: PLAVIX Take 75 mg by mouth daily.    Entresto 24-26 MG Generic drug: sacubitril-valsartan Take 1 tablet by mouth 2 (two) times daily.   furosemide 40 MG tablet Commonly known as: LASIX Take 1 tablet (40 mg total) by mouth daily.   hydrOXYzine 10 MG tablet Commonly known as: ATARAX Take 10 mg by mouth 3 (three) times daily as needed for itching.   Jardiance 10 MG Tabs tablet Generic drug: empagliflozin Take 1 tablet (10 mg total) by mouth daily.   metoprolol succinate 25 MG 24 hr tablet Commonly known as: TOPROL-XL Take 1 tablet by mouth daily.   spironolactone 25 MG tablet Commonly known as: ALDACTONE Take 12.5 mg by mouth daily.       No Known Allergies  Follow-up Information     [facility]. Go in 24 day(s).   Specialty: Cardiology Why: Hospital follow up on 08/21/2022 @ 11 am.  PLEASE bring a current medication list to appointment FREE valet parking, Entrance C, [place] information: [address] [id number] [employer]        Rolland Porter, PA-C. Schedule an appointment as soon as possible for a visit in 1 week(s).   Specialty: Physician Assistant Contact information: [address] [place] [address] [phone]                  The results of significant diagnostics from this hospitalization (including imaging, microbiology, ancillary and laboratory) are listed below for reference.    Significant Diagnostic Studies: CARDIAC CATHETERIZATION  Result Date: 07/30/2022   2nd Diag lesion is 50% stenosed.   Prox Cx to Mid Cx lesion is 10% stenosed. Mild coronary artery disease with ostial 50% second diagonal stenosis in a large LAD system; patent left circumflex stent with minimal mid irregularity of 10%; and normal RCA. No significant right heart pressure elevation with mild PA systolic elevation at 36 mm without without evidence for elevated pulmonary artery mean  pressure. LVEDP 5 mmHg. RECOMMENDATION: Guideline directed medical therapy for reduced EF at 25 to 30% on echocardiographic evaluation.   ECHOCARDIOGRAM COMPLETE  Result Date: 07/27/2022    ECHOCARDIOGRAM REPORT   Patient Name:   Cody Small Date of Exam: 07/27/2022 Medical Rec #:  [id number]   Height:       67.0 in Accession #:    [id number]  Weight:       156.3 lb Date of Birth:  06/02/1946    BSA:          1.821 m Patient Age:    76 years    BP:           149/91 mmHg Patient Gender: M           HR:           60 bpm. Exam Location:  Inpatient Procedure: 2D Echo and Intracardiac Opacification Agent Indications:    CHF  History:        Patient  has no prior history of Echocardiogram examinations.  Sonographer:    Harvie Junior Referring Phys: [facility] [place] IMPRESSIONS  1. Left ventricular ejection fraction, by estimation, is 25 to 30%. The left ventricle has severely decreased function. The left ventricle demonstrates regional wall motion abnormalities (see scoring diagram/findings for description). The left ventricular internal cavity size was mildly to moderately dilated. Left ventricular diastolic parameters are consistent with Grade III diastolic dysfunction (restrictive). Elevated left ventricular end-diastolic pressure. There is severe hypokinesis of the left ventricular, entire inferolateral wall.  2. Right ventricular systolic function is moderately reduced. The right ventricular size is normal. There is moderately elevated pulmonary artery systolic pressure. The estimated right ventricular systolic pressure is Q000111Q mmHg.  3. Right atrial size was mildly dilated.  4. Large pleural effusion in both left and right lateral regions.  5. The mitral valve is normal in structure. Mild to moderate mitral valve regurgitation. No evidence of mitral stenosis.  6. The aortic valve is tricuspid. There is mild calcification of the aortic valve. There is mild thickening of the aortic valve. Aortic valve regurgitation is  mild. Aortic valve sclerosis/calcification is present, without any evidence of aortic stenosis. Aortic regurgitation PHT measures 609 msec. Aortic valve mean gradient measures 4.5 mmHg.  7. The inferior vena cava is normal in size with <50% respiratory variability, suggesting right atrial pressure of 8 mmHg. Comparison(s): Prior images unable to be directly viewed, comparison made by report only. Echo report from [facility] 05/05/21: EF 40-45%, hypokinesis of inferolateral wall, moderate pulmonary hypertension. Conclusion(s)/Recommendation(s): Severely reduced LVEF, worse compared to prior report. Findings communicated to Dr. Broadus John. FINDINGS  Left Ventricle: GLobal hypokinesis with severe hypokinesis of inferolateral wall. Echo contrast shows trabeculation but no evidence of LV thrombus. Left ventricular ejection fraction, by estimation, is 25 to 30%. The left ventricle has severely decreased function. The left ventricle demonstrates regional wall motion abnormalities. Severe hypokinesis of the left ventricular, entire inferolateral wall. Definity contrast agent was given IV to delineate the left ventricular endocardial borders. The  left ventricular internal cavity size was mildly to moderately dilated. There is no left ventricular hypertrophy. Left ventricular diastolic parameters are consistent with Grade III diastolic dysfunction (restrictive). Elevated left ventricular end-diastolic pressure. Right Ventricle: The right ventricular size is normal. Right vetricular wall thickness was not well visualized. Right ventricular systolic function is moderately reduced. There is moderately elevated pulmonary artery systolic pressure. The tricuspid regurgitant velocity is 3.54 m/s, and with an assumed right atrial pressure of 8 mmHg, the estimated right ventricular systolic pressure is Q000111Q mmHg. Left Atrium: Left atrial size was normal in size. Right Atrium: Right atrial size was mildly dilated. Pericardium: There is no  evidence of pericardial effusion. Mitral Valve: The mitral valve is normal in structure. Mild to moderate mitral valve regurgitation. No evidence of mitral valve stenosis. Tricuspid Valve: The tricuspid valve is normal in structure. Tricuspid valve regurgitation is mild . No evidence of tricuspid stenosis. Aortic Valve: The aortic valve is tricuspid. There is mild calcification of the aortic valve. There is mild thickening of the aortic valve. Aortic valve regurgitation is mild. Aortic regurgitation PHT measures 609 msec. Aortic valve sclerosis/calcification is present, without any evidence of aortic stenosis. Aortic valve mean gradient measures 4.5 mmHg. Aortic valve peak gradient measures 9.1 mmHg. Aortic valve area, by VTI measures 1.37 cm. Pulmonic Valve: The pulmonic valve was grossly normal. Pulmonic valve regurgitation is mild. No evidence of pulmonic stenosis. Aorta: The aortic root, ascending aorta, aortic arch and  descending aorta are all structurally normal, with no evidence of dilitation or obstruction. Venous: The inferior vena cava is normal in size with less than 50% respiratory variability, suggesting right atrial pressure of 8 mmHg. IAS/Shunts: The atrial septum is grossly normal. Additional Comments: There is a large pleural effusion in both left and right lateral regions.  LEFT VENTRICLE PLAX 2D LVIDd:         5.60 cm      Diastology LVIDs:         5.00 cm      LV e' medial:    3.38 cm/s LV PW:         0.90 cm      LV E/e' medial:  32.0 LV IVS:        0.90 cm      LV e' lateral:   3.60 cm/s LVOT diam:     2.00 cm      LV E/e' lateral: 30.0 LV SV:         47 LV SV Index:   26 LVOT Area:     3.14 cm  LV Volumes (MOD) LV vol d, MOD A2C: 142.0 ml LV vol d, MOD A4C: 124.5 ml LV vol s, MOD A2C: 102.3 ml LV vol s, MOD A4C: 87.2 ml LV SV MOD A2C:     39.7 ml LV SV MOD A4C:     124.5 ml LV SV MOD BP:      36.9 ml RIGHT VENTRICLE RV Basal diam:  3.60 cm RV Mid diam:    3.40 cm RV S prime:     8.78 cm/s  TAPSE (M-mode): 1.1 cm LEFT ATRIUM             Index        RIGHT ATRIUM           Index LA diam:        3.50 cm 1.92 cm/m   RA Area:     14.00 cm LA Vol (A2C):   47.0 ml 25.81 ml/m  RA Volume:   41.10 ml  22.57 ml/m LA Vol (A4C):   41.4 ml 22.73 ml/m LA Biplane Vol: 44.7 ml 24.55 ml/m  AORTIC VALVE                    PULMONIC VALVE AV Area (Vmax):    1.68 cm     PV Vmax:          0.84 m/s AV Area (Vmean):   1.49 cm     PV Peak grad:     2.8 mmHg AV Area (VTI):     1.37 cm     PR End Diast Vel: 14.59 msec AV Vmax:           150.50 cm/s AV Vmean:          98.800 cm/s AV VTI:            0.344 m AV Peak Grad:      9.1 mmHg AV Mean Grad:      4.5 mmHg LVOT Vmax:         80.60 cm/s LVOT Vmean:        47.000 cm/s LVOT VTI:          0.150 m LVOT/AV VTI ratio: 0.44 AI PHT:            609 msec  AORTA Ao Root diam: 3.00 cm Ao Asc diam:  2.70 cm MITRAL VALVE  TRICUSPID VALVE MV Area (PHT): 4.80 cm     TR Peak grad:   50.1 mmHg MV Decel Time: 158 msec     TR Vmax:        354.00 cm/s MR Peak grad: 76.9 mmHg MR Vmax:      438.33 cm/s   SHUNTS MV E velocity: 108.00 cm/s  Systemic VTI:  0.15 m MV A velocity: 27.90 cm/s   Systemic Diam: 2.00 cm MV E/A ratio:  3.87 Buford Dresser MD Electronically signed by Buford Dresser MD Signature Date/Time: 07/27/2022/3:44:12 PM    Final    DG Chest 2 View  Result Date: 07/26/2022 CLINICAL DATA:  Shortness of breath EXAM: CHEST - 2 VIEW COMPARISON:  Previous studies including the examination of 05/25/2014 FINDINGS: Transverse diameter of heart is slightly increased. Central pulmonary vessels are prominent. There is subtle increase in interstitial markings in parahilar regions and lower lung fields. There is no focal consolidation. There is minimal blunting of right lateral CP angle, possibly suggesting minimal pleural effusion. There is no pneumothorax. IMPRESSION: The subtle increase in interstitial markings in parahilar regions and lower lung fields  suggesting mild interstitial edema or interstitial pneumonia. There is no focal pulmonary consolidation. Electronically Signed   By: Elmer Picker M.D.   On: 07/26/2022 16:44    Microbiology: Recent Results (from the past 240 hour(s))  Resp Panel by RT-PCR (Flu A&B, Covid) Anterior Nasal Swab     Status: None   Collection Time: 07/26/22  6:09 PM   Specimen: Anterior Nasal Swab  Result Value Ref Range Status   SARS Coronavirus 2 by RT PCR NEGATIVE NEGATIVE Final    Comment: (NOTE) SARS-CoV-2 target nucleic acids are NOT DETECTED.  The SARS-CoV-2 RNA is generally detectable in upper respiratory specimens during the acute phase of infection. The lowest concentration of SARS-CoV-2 viral copies this assay can detect is 138 copies/mL. A negative result does not preclude SARS-Cov-2 infection and should not be used as the sole basis for treatment or other patient management decisions. A negative result may occur with  improper specimen collection/handling, submission of specimen other than nasopharyngeal swab, presence of viral mutation(s) within the areas targeted by this assay, and inadequate number of viral copies(<138 copies/mL). A negative result must be combined with clinical observations, patient history, and epidemiological information. The expected result is Negative.  Fact Sheet for Patients:  EntrepreneurPulse.com.au  Fact Sheet for Healthcare Providers:  IncredibleEmployment.be  This test is no t yet approved or cleared by the [place] FDA and  has been authorized for detection and/or diagnosis of SARS-CoV-2 by FDA under an Emergency Use Authorization (EUA). This EUA will remain  in effect (meaning this test can be used) for the duration of the COVID-19 declaration under Section 564(b)(1) of the Act, 21 U.S.C.section 360bbb-3(b)(1), unless the authorization is terminated  or revoked sooner.       Influenza A by PCR NEGATIVE  NEGATIVE Final   Influenza B by PCR NEGATIVE NEGATIVE Final    Comment: (NOTE) The Xpert Xpress SARS-CoV-2/FLU/RSV plus assay is intended as an aid in the diagnosis of influenza from Nasopharyngeal swab specimens and should not be used as a sole basis for treatment. Nasal washings and aspirates are unacceptable for Xpert Xpress SARS-CoV-2/FLU/RSV testing.  Fact Sheet for Patients: EntrepreneurPulse.com.au  Fact Sheet for Healthcare Providers: IncredibleEmployment.be  This test is not yet approved or cleared by the [place] FDA and has been authorized for detection and/or diagnosis of SARS-CoV-2 by FDA under an  Emergency Use Authorization (EUA). This EUA will remain in effect (meaning this test can be used) for the duration of the COVID-19 declaration under Section 564(b)(1) of the Act, 21 U.S.C. section 360bbb-3(b)(1), unless the authorization is terminated or revoked.  Performed at [facility], [address] [place], [place] [address]   SARS Coronavirus 2 by RT PCR (hospital order, performed in [facility] hospital lab) *cepheid single result test* Anterior Nasal Swab     Status: Abnormal   Collection Time: 07/29/22 10:41 PM   Specimen: Anterior Nasal Swab  Result Value Ref Range Status   SARS Coronavirus 2 by RT PCR POSITIVE (A) NEGATIVE Final    Comment: (NOTE) SARS-CoV-2 target nucleic acids are DETECTED  SARS-CoV-2 RNA is generally detectable in upper respiratory specimens  during the acute phase of infection.  Positive results are indicative  of the presence of the identified virus, but do not rule out bacterial infection or co-infection with other pathogens not detected by the test.  Clinical correlation with patient history and  other diagnostic information is necessary to determine patient infection status.  The expected result is negative.  Fact Sheet for Patients:    https://www.patel.info/   Fact Sheet for Healthcare Providers:   https://hall.com/    This test is not yet approved or cleared by the [place] FDA and  has been authorized for detection and/or diagnosis of SARS-CoV-2 by FDA under an Emergency Use Authorization (EUA).  This EUA will remain in effect (meaning this test can be used) for the duration of  the COVID-19 declaration under Section 564(b)(1)  of the Act, 21 U.S.C. section 360-bbb-3(b)(1), unless the authorization is terminated or revoked sooner.   Performed at [facility], [place] [address], [place], [place] [address]      Labs: Basic Metabolic Panel: Recent Labs  Lab 07/28/22 0137 07/29/22 0147 07/30/22 0257 07/30/22 1155 07/30/22 1159 07/31/22 0140 08/01/22 0229  NA 136 134* 130* 137  136 135 130* 132*  K 3.5 3.5 3.7 4.1  4.1 4.1 4.0 4.1  CL 103 99 96*  --   --  97* 99  CO2 24 24 24   --   --  23 25  GLUCOSE 111* 129* 104*  --   --  107* 125*  BUN 26* 35* 36*  --   --  42* 45*  CREATININE 1.69* 1.73* 1.77*  --   --  1.66* 1.61*  CALCIUM 8.9 8.8* 8.3*  --   --  8.2* 8.4*   Liver Function Tests: No results for input(s): "AST", "ALT", "ALKPHOS", "BILITOT", "PROT", "ALBUMIN" in the last 168 hours. No results for input(s): "LIPASE", "AMYLASE" in the last 168 hours. No results for input(s): "AMMONIA" in the last 168 hours. CBC: Recent Labs  Lab 07/26/22 1809 07/29/22 0147 07/30/22 0257 07/30/22 1155 07/30/22 1159 07/31/22 0140  WBC 7.9 10.4 8.9  --   --  8.5  NEUTROABS 5.0  --  4.9  --   --   --   HGB 10.9* 13.2 13.0 13.9  13.9 13.9 12.7*  HCT 32.4* 39.7 38.3* 41.0  41.0 41.0 36.2*  MCV 85.5 85.6 84.4  --   --  83.2  PLT 240 273 220  --   --  212   Cardiac Enzymes: No results for input(s): "CKTOTAL", "CKMB", "CKMBINDEX", "TROPONINI" in the last 168 hours. BNP: BNP (last 3 results) Recent Labs    07/26/22 1844  BNP 3,067.7*    ProBNP (last  3  results) No results for input(s): "PROBNP" in the last 8760 hours.  CBG: Recent Labs  Lab 07/30/22 1234 07/30/22 1607 07/30/22 2243 07/31/22 0818 07/31/22 1120  GLUCAP 91 125* 106* 93 106*       Signed:  Domenic Polite MD.  [facility] 08/01/2022, 12:20 PM

## 2022-08-01 NOTE — Telephone Encounter (Signed)
Pts daughter called requesting to have labs drawn at a different location. I called her back no answer/ left vm for return call

## 2022-08-02 ENCOUNTER — Other Ambulatory Visit (HOSPITAL_COMMUNITY): Payer: Self-pay

## 2022-08-02 DIAGNOSIS — I5022 Chronic systolic (congestive) heart failure: Secondary | ICD-10-CM

## 2022-08-08 ENCOUNTER — Other Ambulatory Visit (HOSPITAL_COMMUNITY): Payer: Medicare HMO

## 2022-08-08 ENCOUNTER — Other Ambulatory Visit (HOSPITAL_COMMUNITY): Payer: Self-pay

## 2022-08-08 DIAGNOSIS — I5022 Chronic systolic (congestive) heart failure: Secondary | ICD-10-CM

## 2022-08-10 ENCOUNTER — Other Ambulatory Visit (HOSPITAL_COMMUNITY): Payer: Self-pay

## 2022-08-10 DIAGNOSIS — I5022 Chronic systolic (congestive) heart failure: Secondary | ICD-10-CM

## 2022-08-10 LAB — BASIC METABOLIC PANEL
BUN/Creatinine Ratio: 22 (ref 10–24)
BUN: 48 mg/dL — ABNORMAL HIGH (ref 8–27)
CO2: 18 mmol/L — ABNORMAL LOW (ref 20–29)
Calcium: 9.7 mg/dL (ref 8.6–10.2)
Chloride: 100 mmol/L (ref 96–106)
Creatinine, Ser: 2.18 mg/dL — ABNORMAL HIGH (ref 0.76–1.27)
Glucose: 99 mg/dL (ref 70–99)
Potassium: 5.4 mmol/L — ABNORMAL HIGH (ref 3.5–5.2)
Sodium: 135 mmol/L (ref 134–144)
eGFR: 31 mL/min/{1.73_m2} — ABNORMAL LOW (ref >59)

## 2022-08-20 NOTE — Progress Notes (Signed)
HEART & VASCULAR TRANSITION OF CARE CONSULT NOTE     Referring Physician: Primary Care: Primary Cardiologist:  HPI: Referred to clinic by *** for heart failure consultation. 77 y.o. male with history of CAD s/p NSTEMI 09/21 with 100% p Lcx treated with DES, EF 50-55% post MI with inferoposterior HK. Echo 09/22 EF 40-45% with severe inferolateral HK. He has been followed by Cardiology at [facility]. Other history includes DM II, HLD, HTN, CKD IIIa.  He was admitted 07/26/22 with a/c CHF. Reported compliance with meds but had dietary indiscretions. He was hypertensive. BNP > 3000. Later tested + for COVID 19. Echo EF 25-30%, RWMA, grade III DD, moderately reduced RV. Cardiology consulted. He was diuresed with IV lasix and GDMT titrated. R/LHC: Nonobstructive CAD, RA mean 1, PA mean 17, PCWP mean 16, PA sat 69%, Fick CO/CI 4.4/2.5.   He is here today for hospital follow-up.   Cardiac Testing    Review of Systems: [y] = yes, [ ]  = no   General: Weight gain [ ] ; Weight loss [ ] ; Anorexia [ ] ; Fatigue [ ] ; Fever [ ] ; Chills [ ] ; Weakness [ ]   Cardiac: Chest pain/pressure [ ] ; Resting SOB [ ] ; Exertional SOB [ ] ; Orthopnea [ ] ; Pedal Edema [ ] ; Palpitations [ ] ; Syncope [ ] ; Presyncope [ ] ; Paroxysmal nocturnal dyspnea[ ]   Pulmonary: Cough [ ] ; Wheezing[ ] ; Hemoptysis[ ] ; Sputum [ ] ; Snoring [ ]   GI: Vomiting[ ] ; Dysphagia[ ] ; Melena[ ] ; Hematochezia [ ] ; Heartburn[ ] ; Abdominal pain [ ] ; Constipation [ ] ; Diarrhea [ ] ; BRBPR [ ]   GU: Hematuria[ ] ; Dysuria [ ] ; Nocturia[ ]   Vascular: Pain in legs with walking [ ] ; Pain in feet with lying flat [ ] ; Non-healing sores [ ] ; Stroke [ ] ; TIA [ ] ; Slurred speech [ ] ;  Neuro: Headaches[ ] ; Vertigo[ ] ; Seizures[ ] ; Paresthesias[ ] ;Blurred vision [ ] ; Diplopia [ ] ; Vision changes [ ]   Ortho/Skin: Arthritis [ ] ; Joint pain [ ] ; Muscle pain [ ] ; Joint swelling [ ] ; Back Pain [ ] ; Rash [ ]   Psych: Depression[ ] ; Anxiety[ ]   Heme: Bleeding problems [ ] ;  Clotting disorders [ ] ; Anemia [ ]   Endocrine: Diabetes [ ] ; Thyroid dysfunction[ ]    Past Medical History:  Diagnosis Date   Asthma    DM2 (diabetes mellitus, type 2) (HCC)    HFrEF (heart failure with reduced ejection fraction) (HCC)    HLD (hyperlipidemia)    HTN (hypertension)    MI (myocardial infarction) (HCC)     Current Outpatient Medications  Medication Sig Dispense Refill   aspirin EC 81 MG tablet Take 81 mg by mouth daily.     atorvastatin (LIPITOR) 80 MG tablet Take 80 mg by mouth every evening.     budesonide-formoterol (SYMBICORT) 160-4.5 MCG/ACT inhaler Inhale 2 puffs into the lungs 2 (two) times daily.     clopidogrel (PLAVIX) 75 MG tablet Take 75 mg by mouth daily.     empagliflozin (JARDIANCE) 10 MG TABS tablet Take 1 tablet (10 mg total) by mouth daily. 30 tablet 0   furosemide (LASIX) 40 MG tablet Take 1 tablet (40 mg total) by mouth daily. 30 tablet 0   hydrOXYzine (ATARAX) 10 MG tablet Take 10 mg by mouth 3 (three) times daily as needed for itching.     metoprolol succinate (TOPROL-XL) 25 MG 24 hr tablet Take 1 tablet by mouth daily.     sacubitril-valsartan (ENTRESTO) 24-26 MG Take 1 tablet by  mouth 2 (two) times daily. 60 tablet 0   spironolactone (ALDACTONE) 25 MG tablet Take 12.5 mg by mouth daily.     No current facility-administered medications for this visit.    No Known Allergies    Social History   Socioeconomic History   Marital status: Married    Spouse name: Juliann Pulse   Number of children: 3   Years of education: Not on file   Highest education level: High school graduate  Occupational History   Occupation: Retired  Tobacco Use   Smoking status: Former   Smokeless tobacco: Never  Scientific laboratory technician Use: Never used  Substance and Sexual Activity   Alcohol use: No   Drug use: Never   Sexual activity: Not on file  Other Topics Concern   Not on file  Social History Narrative   Not on file   Social Determinants of Health   Financial  Resource Strain: Low Risk  (07/27/2022)   Overall Financial Resource Strain (CARDIA)    Difficulty of Paying Living Expenses: Not hard at all  Food Insecurity: No Food Insecurity (07/27/2022)   Hunger Vital Sign    Worried About Running Out of Food in the Last Year: Never true    [place] in the Last Year: Never true  Transportation Needs: No Transportation Needs (07/27/2022)   PRAPARE - Hydrologist (Medical): No    Lack of Transportation (Non-Medical): No  Physical Activity: Not on file  Stress: Not on file  Social Connections: Not on file  Intimate Partner Violence: Not At Risk (07/27/2022)   Humiliation, Afraid, Rape, and Kick questionnaire    Fear of Current or Ex-Partner: No    Emotionally Abused: No    Physically Abused: No    Sexually Abused: No      Family History  Problem Relation Age of Onset   Kidney failure Mother    Cancer Sister    Pancreatic cancer Brother     There were no vitals filed for this visit.  PHYSICAL EXAM: General:  Well appearing. No respiratory difficulty HEENT: normal Neck: supple. no JVD. Carotids 2+ bilat; no bruits. No lymphadenopathy or thryomegaly appreciated. Cor: PMI nondisplaced. Regular rate & rhythm. No rubs, gallops or murmurs. Lungs: clear Abdomen: soft, nontender, nondistended. No hepatosplenomegaly. No bruits or masses. Good bowel sounds. Extremities: no cyanosis, clubbing, rash, edema Neuro: alert & oriented x 3, cranial nerves grossly intact. moves all 4 extremities w/o difficulty. Affect pleasant.  ECG:   ASSESSMENT & PLAN: HFrEF/NICM EF 50-55% at time of MI in 2021 EF 40-45% with inferolateral HK 2022 Echo 12/22: EF 20=25%, RWMA, RV moderately reduced R/LHC 12/23 Nonobstructive CAD, RA mean 1, PA mean 17, PCWP mean 16, Fick CO/CI 1.9/1.4 Etiology uncertain. Had COVID-19 infection during recent admit but presentation does not appear c/w myocarditis. HS troponin not significantly  elevated NYHA GDMT  Diuretic- BB- Ace/ARB/ARNI MRA SGLT2i  CAD -Hx NSTEMI 2021 s/p DES to Lcx  DM II  HTN  HLD  CKD IIIa    Referred to HFSW (PCP, Medications, Transportation, ETOH Abuse, Drug Abuse, Insurance, Financial ): Yes or No Refer to Pharmacy: Yes or No Refer to Home Health: Yes on No Refer to Advanced Heart Failure Clinic: Yes or no  Refer to General Cardiology: Yes or No  Follow up

## 2022-08-21 ENCOUNTER — Encounter (HOSPITAL_COMMUNITY): Payer: Self-pay

## 2022-08-21 ENCOUNTER — Other Ambulatory Visit (HOSPITAL_COMMUNITY): Payer: Self-pay | Admitting: Cardiology

## 2022-08-21 ENCOUNTER — Telehealth (HOSPITAL_COMMUNITY): Payer: Self-pay | Admitting: *Deleted

## 2022-08-21 ENCOUNTER — Ambulatory Visit (HOSPITAL_COMMUNITY)
Admit: 2022-08-21 | Discharge: 2022-08-21 | Disposition: A | Discharge status: Home / Self Care | Payer: Medicare HMO | Attending: Family Medicine | Admitting: Family Medicine

## 2022-08-21 ENCOUNTER — Ambulatory Visit (HOSPITAL_COMMUNITY)
Admission: RE | Admit: 2022-08-21 | Discharge: 2022-08-21 | Disposition: A | Discharge status: Home / Self Care | Payer: Medicare HMO | Source: Ambulatory Visit | Attending: Cardiology | Admitting: Cardiology

## 2022-08-21 VITALS — BP 110/70 | HR 60 | Wt 138.0 lb

## 2022-08-21 DIAGNOSIS — Z8673 Personal history of transient ischemic attack (TIA), and cerebral infarction without residual deficits: Secondary | ICD-10-CM | POA: Diagnosis not present

## 2022-08-21 DIAGNOSIS — Z7902 Long term (current) use of antithrombotics/antiplatelets: Secondary | ICD-10-CM | POA: Insufficient documentation

## 2022-08-21 DIAGNOSIS — E1122 Type 2 diabetes mellitus with diabetic chronic kidney disease: Secondary | ICD-10-CM | POA: Insufficient documentation

## 2022-08-21 DIAGNOSIS — Z7984 Long term (current) use of oral hypoglycemic drugs: Secondary | ICD-10-CM | POA: Diagnosis not present

## 2022-08-21 DIAGNOSIS — I5020 Unspecified systolic (congestive) heart failure: Secondary | ICD-10-CM | POA: Diagnosis not present

## 2022-08-21 DIAGNOSIS — I252 Old myocardial infarction: Secondary | ICD-10-CM | POA: Diagnosis not present

## 2022-08-21 DIAGNOSIS — N1832 Chronic kidney disease, stage 3b: Secondary | ICD-10-CM

## 2022-08-21 DIAGNOSIS — Z955 Presence of coronary angioplasty implant and graft: Secondary | ICD-10-CM | POA: Diagnosis not present

## 2022-08-21 DIAGNOSIS — I493 Ventricular premature depolarization: Secondary | ICD-10-CM

## 2022-08-21 DIAGNOSIS — Z79899 Other long term (current) drug therapy: Secondary | ICD-10-CM | POA: Insufficient documentation

## 2022-08-21 DIAGNOSIS — I2510 Atherosclerotic heart disease of native coronary artery without angina pectoris: Secondary | ICD-10-CM

## 2022-08-21 DIAGNOSIS — I130 Hypertensive heart and chronic kidney disease with heart failure and stage 1 through stage 4 chronic kidney disease, or unspecified chronic kidney disease: Secondary | ICD-10-CM | POA: Insufficient documentation

## 2022-08-21 DIAGNOSIS — E785 Hyperlipidemia, unspecified: Secondary | ICD-10-CM | POA: Insufficient documentation

## 2022-08-21 DIAGNOSIS — Z7982 Long term (current) use of aspirin: Secondary | ICD-10-CM | POA: Diagnosis not present

## 2022-08-21 LAB — COMPREHENSIVE METABOLIC PANEL
ALT: 37 U/L (ref 0–44)
AST: 33 U/L (ref 15–41)
Albumin: 3.1 g/dL — ABNORMAL LOW (ref 3.5–5.0)
Alkaline Phosphatase: 89 U/L (ref 38–126)
Anion gap: 6 (ref 5–15)
BUN: 66 mg/dL — ABNORMAL HIGH (ref 8–23)
CO2: 19 mmol/L — ABNORMAL LOW (ref 22–32)
Calcium: 9.4 mg/dL (ref 8.9–10.3)
Chloride: 105 mmol/L (ref 98–111)
Creatinine, Ser: 2.28 mg/dL — ABNORMAL HIGH (ref 0.61–1.24)
GFR, Estimated: 29 mL/min — ABNORMAL LOW (ref >60)
Glucose, Bld: 107 mg/dL — ABNORMAL HIGH (ref 70–99)
Potassium: 5.9 mmol/L — ABNORMAL HIGH (ref 3.5–5.1)
Sodium: 130 mmol/L — ABNORMAL LOW (ref 135–145)
Total Bilirubin: 0.5 mg/dL (ref 0.3–1.2)
Total Protein: 9.7 g/dL — ABNORMAL HIGH (ref 6.5–8.1)

## 2022-08-21 LAB — BRAIN NATRIURETIC PEPTIDE: B Natriuretic Peptide: 107.1 pg/mL — ABNORMAL HIGH (ref 0.0–100.0)

## 2022-08-21 MED ORDER — FUROSEMIDE 40 MG PO TABS: 40.0000 mg | ORAL_TABLET | ORAL | 11 refills | Status: AC | PRN

## 2022-08-21 NOTE — Patient Instructions (Signed)
Stop Plavix. Stop Entresto. Only take Lasix 40 mg as needed. Labs today - will call you if abnormal. Zio patch placed onto patient.  All instructions and information reviewed with patient, they verbalize understanding with no questions. Return to see Dr. Daniel Nones in [facility] in 2 weeks. If any questions or concerns prior to next visit, please call Heart Failure Clinic at [phone].   Your provider has recommended that  you wear a Zio Patch for 14  days.  This monitor will record your heart rhythm for our review.  IF you have any symptoms while wearing the monitor please press the button.  If you have any issues with the patch or you notice a red or orange light on it please call the company at [phone].  Once you remove the patch please mail it back to the company as soon as possible so we can get the results.

## 2022-08-21 NOTE — Telephone Encounter (Signed)
Called to confirm Heart & Vascular Transitions of Care appointment at 11 am on 08/21/22. Patient reminded to bring all medications and pill box organizer with them. Confirmed patient has transportation. Gave directions, instructed to utilize [place] parking.  Confirmed appointment prior to ending call.    Earnestine Leys, BSN, Clinical cytogeneticist Only

## 2022-08-27 ENCOUNTER — Encounter (HOSPITAL_COMMUNITY): Payer: Self-pay

## 2022-08-28 ENCOUNTER — Other Ambulatory Visit (HOSPITAL_COMMUNITY): Payer: Self-pay

## 2022-08-28 MED ORDER — EMPAGLIFLOZIN 10 MG PO TABS: 10.0000 mg | ORAL_TABLET | Freq: Every day | ORAL | 2 refills | Status: DC

## 2022-09-18 ENCOUNTER — Ambulatory Visit (HOSPITAL_COMMUNITY)
Admission: RE | Admit: 2022-09-18 | Discharge: 2022-09-18 | Disposition: A | Discharge status: Home / Self Care | Payer: Medicare PPO | Source: Ambulatory Visit | Attending: Cardiology | Admitting: Cardiology

## 2022-09-18 ENCOUNTER — Other Ambulatory Visit (HOSPITAL_COMMUNITY): Payer: Self-pay

## 2022-09-18 VITALS — BP 135/89 | HR 52 | Wt 142.2 lb

## 2022-09-18 DIAGNOSIS — N1832 Chronic kidney disease, stage 3b: Secondary | ICD-10-CM | POA: Diagnosis not present

## 2022-09-18 DIAGNOSIS — Z955 Presence of coronary angioplasty implant and graft: Secondary | ICD-10-CM | POA: Insufficient documentation

## 2022-09-18 DIAGNOSIS — I130 Hypertensive heart and chronic kidney disease with heart failure and stage 1 through stage 4 chronic kidney disease, or unspecified chronic kidney disease: Secondary | ICD-10-CM | POA: Insufficient documentation

## 2022-09-18 DIAGNOSIS — I252 Old myocardial infarction: Secondary | ICD-10-CM | POA: Diagnosis not present

## 2022-09-18 DIAGNOSIS — Z79899 Other long term (current) drug therapy: Secondary | ICD-10-CM | POA: Diagnosis not present

## 2022-09-18 DIAGNOSIS — I10 Essential (primary) hypertension: Secondary | ICD-10-CM | POA: Diagnosis not present

## 2022-09-18 DIAGNOSIS — I2510 Atherosclerotic heart disease of native coronary artery without angina pectoris: Secondary | ICD-10-CM

## 2022-09-18 DIAGNOSIS — I5022 Chronic systolic (congestive) heart failure: Secondary | ICD-10-CM

## 2022-09-18 DIAGNOSIS — I255 Ischemic cardiomyopathy: Secondary | ICD-10-CM | POA: Insufficient documentation

## 2022-09-18 DIAGNOSIS — J45909 Unspecified asthma, uncomplicated: Secondary | ICD-10-CM | POA: Diagnosis not present

## 2022-09-18 DIAGNOSIS — E785 Hyperlipidemia, unspecified: Secondary | ICD-10-CM | POA: Insufficient documentation

## 2022-09-18 DIAGNOSIS — N1831 Chronic kidney disease, stage 3a: Secondary | ICD-10-CM | POA: Insufficient documentation

## 2022-09-18 DIAGNOSIS — Z7982 Long term (current) use of aspirin: Secondary | ICD-10-CM | POA: Diagnosis not present

## 2022-09-18 DIAGNOSIS — E1122 Type 2 diabetes mellitus with diabetic chronic kidney disease: Secondary | ICD-10-CM | POA: Diagnosis not present

## 2022-09-18 LAB — CBC
HCT: 36.8 % — ABNORMAL LOW (ref 39.0–52.0)
Hemoglobin: 12.4 g/dL — ABNORMAL LOW (ref 13.0–17.0)
MCH: 28.9 pg (ref 26.0–34.0)
MCHC: 33.7 g/dL (ref 30.0–36.0)
MCV: 85.8 fL (ref 80.0–100.0)
Platelets: 244 10*3/uL (ref 150–400)
RBC: 4.29 MIL/uL (ref 4.22–5.81)
RDW: 16.5 % — ABNORMAL HIGH (ref 11.5–15.5)
WBC: 7.3 10*3/uL (ref 4.0–10.5)
nRBC: 0 % (ref 0.0–0.2)

## 2022-09-18 LAB — BASIC METABOLIC PANEL
Anion gap: 6 (ref 5–15)
BUN: 22 mg/dL (ref 8–23)
CO2: 22 mmol/L (ref 22–32)
Calcium: 9.2 mg/dL (ref 8.9–10.3)
Chloride: 108 mmol/L (ref 98–111)
Creatinine, Ser: 1.35 mg/dL — ABNORMAL HIGH (ref 0.61–1.24)
GFR, Estimated: 55 mL/min — ABNORMAL LOW (ref >60)
Glucose, Bld: 81 mg/dL (ref 70–99)
Potassium: 3.8 mmol/L (ref 3.5–5.1)
Sodium: 136 mmol/L (ref 135–145)

## 2022-09-18 LAB — BRAIN NATRIURETIC PEPTIDE: B Natriuretic Peptide: 179.2 pg/mL — ABNORMAL HIGH (ref 0.0–100.0)

## 2022-09-18 MED ORDER — ENTRESTO 24-26 MG PO TABS: 1.0000 | ORAL_TABLET | Freq: Two times a day (BID) | ORAL | 11 refills | Status: DC

## 2022-09-18 NOTE — Patient Instructions (Signed)
If kidney function stable, start Entresto 12/13 ( 1/2 Tab) Twice daily  Labs done today, your results will be available in MyChart, we will contact you for abnormal readings.  Your physician has requested that you have a cardiac MRI. Cardiac MRI uses a computer to create images of your heart as its beating, producing both still and moving pictures of your heart and major blood vessels. For further information please visit [email or web address] Please follow the instruction sheet given to you today for more information. ONCE APPROVED BY INSURANCE YOU WILL BE CALLED TO HAVE THE TEST ARRANGED.   Please follow up with our heart failure pharmacist in 1 month  Your physician recommends that you schedule a follow-up appointment in: 2 months  If you have any questions or concerns before your next appointment please send [place] a message through [place] or call our office at [phone].    TO LEAVE A MESSAGE FOR THE NURSE SELECT OPTION 2, PLEASE LEAVE A MESSAGE INCLUDING: YOUR NAME DATE OF BIRTH CALL BACK NUMBER REASON FOR CALL**this is important as we prioritize the call backs  YOU WILL RECEIVE A CALL BACK THE SAME DAY AS LONG AS YOU CALL BEFORE 4:00 PM  At the [facility], you and your health needs are our priority. As part of our continuing mission to provide you with exceptional heart care, we have created designated Provider Care Teams. These Care Teams include your primary Cardiologist (physician) and Advanced Practice Providers (APPs- Physician Assistants and Nurse Practitioners) who all work together to provide you with the care you need, when you need it.   You may see any of the following providers on your designated Care Team at your next follow up: Dr Glori Bickers Dr Loralie Champagne Dr. Roxana Hires, NP Lyda Jester, [facility] [place], [place] Forestine Na, NP Audry Riles, PharmD   Please be sure to bring in all your medications  bottles to every appointment.    Thank you for choosing [facility]

## 2022-09-18 NOTE — Progress Notes (Signed)
ADVANCED HEART FAILURE CLINIC NOTE  Referring Physician: Rolland Porter, PA-C  Primary Care: Rolland Porter, PA-C   HPI: Cody Small is a 77 y.o. male with CAD (NSTEMI in 9/21 status post PCI to the circumflex), HFrEF, type 2 diabetes, hyperlipidemia, hypertension, asthma and CKD 3A.  Cody Small reports cardiac history dating back to at least 2021 when he had an NSTEMI leading to an LVEF of 45%. He had an echocardiogram in September 2022 with EF of 40 to 45% and severe inferolateral hypokinesis. He was admitted to [facility] with severe shortness of breath; BNP>3000 in December 2023. He underwent LHC/RHC with nonobstructive CAD. RHC at that time with cardiac index 2.5L/min/m2. He was diagnosed with COVID19 during his hospitalization on July 29, 2022. He had a repeat echo on 07/27/22 where LVEF dropped further to 20-25%. Since that time he was seen in [facility] clinic and statrted on GDMT.   Interval Hx: Since his last appointment, he "has been feeling great". With the addition of GDMT, he has had significant improvement in his exercise capacity and does not report feeling limited at this time due to SOB.   Activity level/exercise tolerance:  NYHAIIB Orthopnea:  Sleeps on 2 pillows Paroxysmal noctural dyspnea:  no Chest pain/pressure:  no Orthostatic lightheadedness:  no Palpitations:  no Lower extremity edema:  no Presyncope/syncope:  no Cough:  no  Past Medical History:  Diagnosis Date   Asthma    DM2 (diabetes mellitus, type 2) (HCC)    HFrEF (heart failure with reduced ejection fraction) (HCC)    HLD (hyperlipidemia)    HTN (hypertension)    MI (myocardial infarction) (HCC)     Current Outpatient Medications  Medication Sig Dispense Refill   aspirin EC 81 MG tablet Take 81 mg by mouth daily.     atorvastatin (LIPITOR) 80 MG tablet Take 80 mg by mouth every evening.     budesonide-formoterol (SYMBICORT) 160-4.5 MCG/ACT inhaler Inhale 2 puffs into the lungs 2 (two)  times daily.     empagliflozin (JARDIANCE) 10 MG TABS tablet Take 1 tablet (10 mg total) by mouth daily. 30 tablet 2   furosemide (LASIX) 40 MG tablet Take 1 tablet (40 mg total) by mouth as needed. 30 tablet 11   hydrOXYzine (ATARAX) 10 MG tablet Take 10 mg by mouth 3 (three) times daily as needed for itching.     metoprolol succinate (TOPROL-XL) 25 MG 24 hr tablet Take 1 tablet by mouth daily.     spironolactone (ALDACTONE) 25 MG tablet Take 12.5 mg by mouth daily. (Patient not taking: Reported on 09/18/2022)     No current facility-administered medications for this encounter.    No Known Allergies    Social History   Socioeconomic History   Marital status: Married    Spouse name: Juliann Pulse   Number of children: 3   Years of education: Not on file   Highest education level: High school graduate  Occupational History   Occupation: Retired  Tobacco Use   Smoking status: Former   Smokeless tobacco: Never  Scientific laboratory technician Use: Never used  Substance and Sexual Activity   Alcohol use: No   Drug use: Never   Sexual activity: Not on file  Other Topics Concern   Not on file  Social History Narrative   Not on file   Social Determinants of Health   Financial Resource Strain: Low Risk  (07/27/2022)   Overall Financial Resource Strain (CARDIA)  Difficulty of Paying Living Expenses: Not hard at all  Food Insecurity: No Food Insecurity (07/27/2022)   Hunger Vital Sign    Worried About Running Out of Food in the Last Year: Never true    Ran Out of Food in the Last Year: Never true  Transportation Needs: No Transportation Needs (07/27/2022)   PRAPARE - Hydrologist (Medical): No    Lack of Transportation (Non-Medical): No  Physical Activity: Not on file  Stress: Not on file  Social Connections: Not on file  Intimate Partner Violence: Not At Risk (07/27/2022)   Humiliation, Afraid, Rape, and Kick questionnaire    Fear of Current or Ex-Partner: No     Emotionally Abused: No    Physically Abused: No    Sexually Abused: No      Family History  Problem Relation Age of Onset   Kidney failure Mother    Cancer Sister    Pancreatic cancer Brother     PHYSICAL EXAM: Vitals:   09/18/22 1051  BP: 135/89  Pulse: (!) 52  SpO2: 98%   GENERAL: Well nourished, well developed, and in no apparent distress at rest.  HEENT: Negative for arcus senilis or xanthelasma. There is no scleral icterus.  The mucous membranes are pink and moist.   NECK: Supple, No masses. Normal carotid upstrokes without bruits. No masses or thyromegaly.    CHEST: There are no chest wall deformities. There is no chest wall tenderness. Respirations are unlabored.  Lungs- CTA B/L CARDIAC:  JVP: 7 cm H2O         Normal S1, S2  Normal rate with regular rhythm. No murmurs, rubs or gallops.  Pulses are 2+ and symmetrical in upper and lower extremities. no edema.  ABDOMEN: Soft, non-tender, non-distended. There are no masses or hepatomegaly. There are normal bowel sounds.  EXTREMITIES: Warm and well perfused with no cyanosis, clubbing.  LYMPHATIC: No axillary or supraclavicular lymphadenopathy.  NEUROLOGIC: Patient is oriented x3 with no focal or lateralizing neurologic deficits.  PSYCH: Patients affect is appropriate, there is no evidence of anxiety or depression.  SKIN: Warm and dry; no lesions or wounds.   DATA REVIEW  ECG:08/21/22: NSR  ECHO: 07/27/22: LVEF 25%-30%; Grade III DD. Severe hypokinesis of the inferolateral wall. Moderately reduced RV function.   CATH: 07/30/22:    2nd Diag lesion is 50% stenosed.   Prox Cx to Mid Cx lesion is 10% stenosed.   Mild coronary artery disease with ostial 50% second diagonal stenosis in a large LAD system; patent left circumflex stent with minimal mid irregularity of 10%; and normal RCA.  No significant right heart pressure elevation with mild PA systolic elevation at 36 mm without without evidence for elevated pulmonary artery  mean pressure. LVEDP 5 mmHg.     ASSESSMENT & PLAN:  Heart failure with reduced EF Etiology of EX:BMWUXLKG cardiomyopathy; NSTEMI in 20192 NYHA class / AHA Stage:II Volume status & Diuretics: Lasix 40mg  PO prn Vasodilators:start Entresto 12/13 BID; concerned about potential side effects; due to this we are starting at a very low dose, although, I suspect he will tolerate it without difficulty.  Beta-Blocker:Toprol 25mg  XL daily; will uptitrate meds slowly as Mr. Swiss is concerned about multiple medication changes and associated side effects MWN:[id number] at follow up  Cardiometabolic:jardiance 10mg  Devices therapies & Valvulopathies:Not currently indicated; repeat TTE at follow up.  Advanced therapies:N/A  2. CAD - PCI to the Lcx in 2019 - Lipitor 80mg  and ASA 81mg  daily  -  LHC in 12/23 with patent stent.  3. HTN - Start Entresto today   4. CKD - sCr today 1.35 from 2.28.  - Jardiance 10mg  daily   Ilea Hilton Advanced Heart Failure Mechanical Circulatory Support

## 2022-09-21 ENCOUNTER — Encounter (HOSPITAL_COMMUNITY): Payer: Self-pay | Admitting: Cardiology

## 2022-09-24 NOTE — Addendum Note (Signed)
Encounter addended by: Micki Riley, RN on: 09/24/2022 1:36 PM  Actions taken: Imaging Exam ended

## 2022-09-25 ENCOUNTER — Telehealth (HOSPITAL_COMMUNITY): Payer: Self-pay

## 2022-09-25 ENCOUNTER — Other Ambulatory Visit (HOSPITAL_COMMUNITY): Payer: Self-pay

## 2022-09-25 DIAGNOSIS — I5022 Chronic systolic (congestive) heart failure: Secondary | ICD-10-CM

## 2022-09-25 NOTE — Telephone Encounter (Signed)
I spoke to Cody Small and informed him of the referral to EP for Atrial Flutter.  He agreed. I put in referral as well.

## 2022-10-17 NOTE — Progress Notes (Incomplete)
***  In Progress***    Advanced Heart Failure Clinic Note   Referring Physician: Rolland Porter, PA-C  Primary Care: Tillie Fantasia HF Cardiologist: Dr. Daniel Nones  HPI:   Cody Small is a 77 y.o. male with CAD (NSTEMI in 04/2020 status post PCI to the circumflex), HFrEF, type 2 diabetes, hyperlipidemia, hypertension, asthma and CKD 3A.  Mr. Helferich reports cardiac history dating back to at least 2021 when he had an NSTEMI leading to an LVEF of 45%. He had an echocardiogram in September 2022 with EF of 40 to 45% and severe inferolateral hypokinesis. He was admitted to [facility] with severe shortness of breath; BNP>3000 in December 2023. He underwent LHC/RHC with nonobstructive CAD. RHC at that time with cardiac index 2.5L/min/m2. He was diagnosed with COVID19 during his hospitalization on July 29, 2022. He had a repeat echo on 07/27/22 where LVEF dropped further to 20-25%. Since that time he was seen in [facility] clinic and statrted on GDMT.    At last visit on 09/18/22 with MD, he had significant improvement in his exercise capacity after the addition of GDMT.  Today he returns to HF clinic for pharmacist medication titration. At last visit with MD ***.   Overall feeling ***. Dizziness, lightheadedness, fatigue:  Chest pain or palpitations:  How is your breathing?: *** SOB: Able to complete all ADLs. Activity level ***  Weight at home pounds. Takes furosemide/torsemide/bumex *** mg *** daily.  LEE PND/Orthopnea  Appetite *** Low-salt diet:   Physical Exam Cost/affordability of meds   HF Medications: Metoprolol succinate 25 mg daily Entresto 24-26 mg BID (maybe taking 1/2 tablet BID) Jardiance 10 mg daily Lasix 40 mg prn  Has the patient been experiencing any side effects to the medications prescribed?  {YES NO:22349}  Does the patient have any problems obtaining medications due to transportation or finances?   {YES NO:22349}  Understanding of regimen:  {excellent/good/fair/poor:19665} Understanding of indications: {excellent/good/fair/poor:19665} Potential of compliance: {excellent/good/fair/poor:19665} Patient understands to avoid NSAIDs. Patient understands to avoid decongestants.    Pertinent Lab Values: Serum creatinine 1.35, BUN 22, Potassium 3.8, Sodium 136, BNP 179.2   Vital Signs: Weight: *** (last clinic weight: ***) Blood pressure: ***  Heart rate: ***   Assessment/Plan: Heart failure with reduced EF Etiology of HF: Ischemic cardiomyopathy; NSTEMI in 2019 NYHA class / AHA Stage:II Volume status & Diuretics: Lasix 74m PO prn Vasodilators: Start Entresto 24/26 0.5 tablet BID; concerned about potential side effects; due to this we are starting at a very low dose, although, I suspect he will tolerate it without difficulty.  Beta-Blocker:Toprol 239mXL daily; will uptitrate meds slowly as Mr. MoBelzers concerned about multiple medication changes and associated side effects MRA: spironolactone at follow up  Cardiometabolic: Jardiance [id number] therapies & Valvulopathies: Not currently indicated; repeat TTE at follow up.  Advanced therapies:N/A   2. CAD - PCI to the Lcx in 2019 - Lipitor 809mnd ASA 21m17mily  - LHC in 12/23 with patent stent.   3. HTN - Start Entresto today    4. CKD - sCr today 1.35 from 2.28.  - Jardiance 10mg39mly  Follow up ***   LaureAudry RilesrmD, BCPS, BCCP, CPP Heart Failure Clinic Pharmacist [phone]

## 2022-10-18 ENCOUNTER — Ambulatory Visit (HOSPITAL_COMMUNITY)
Admission: RE | Admit: 2022-10-18 | Discharge: 2022-10-18 | Disposition: A | Discharge status: Home / Self Care | Payer: Medicare PPO | Source: Ambulatory Visit | Attending: Cardiology | Admitting: Cardiology

## 2022-10-18 VITALS — BP 106/62 | HR 55 | Wt 146.4 lb

## 2022-10-18 DIAGNOSIS — J45909 Unspecified asthma, uncomplicated: Secondary | ICD-10-CM | POA: Diagnosis not present

## 2022-10-18 DIAGNOSIS — I252 Old myocardial infarction: Secondary | ICD-10-CM | POA: Insufficient documentation

## 2022-10-18 DIAGNOSIS — I130 Hypertensive heart and chronic kidney disease with heart failure and stage 1 through stage 4 chronic kidney disease, or unspecified chronic kidney disease: Secondary | ICD-10-CM | POA: Insufficient documentation

## 2022-10-18 DIAGNOSIS — Z8616 Personal history of COVID-19: Secondary | ICD-10-CM | POA: Diagnosis not present

## 2022-10-18 DIAGNOSIS — E1122 Type 2 diabetes mellitus with diabetic chronic kidney disease: Secondary | ICD-10-CM | POA: Diagnosis not present

## 2022-10-18 DIAGNOSIS — Z79899 Other long term (current) drug therapy: Secondary | ICD-10-CM | POA: Diagnosis not present

## 2022-10-18 DIAGNOSIS — N1831 Chronic kidney disease, stage 3a: Secondary | ICD-10-CM | POA: Diagnosis not present

## 2022-10-18 DIAGNOSIS — I255 Ischemic cardiomyopathy: Secondary | ICD-10-CM | POA: Insufficient documentation

## 2022-10-18 DIAGNOSIS — E785 Hyperlipidemia, unspecified: Secondary | ICD-10-CM | POA: Insufficient documentation

## 2022-10-18 DIAGNOSIS — Z7982 Long term (current) use of aspirin: Secondary | ICD-10-CM | POA: Insufficient documentation

## 2022-10-18 DIAGNOSIS — I5020 Unspecified systolic (congestive) heart failure: Secondary | ICD-10-CM | POA: Diagnosis not present

## 2022-10-18 DIAGNOSIS — I5033 Acute on chronic diastolic (congestive) heart failure: Secondary | ICD-10-CM | POA: Diagnosis not present

## 2022-10-18 DIAGNOSIS — I2510 Atherosclerotic heart disease of native coronary artery without angina pectoris: Secondary | ICD-10-CM | POA: Diagnosis not present

## 2022-10-18 DIAGNOSIS — Z955 Presence of coronary angioplasty implant and graft: Secondary | ICD-10-CM | POA: Insufficient documentation

## 2022-10-18 DIAGNOSIS — Z7984 Long term (current) use of oral hypoglycemic drugs: Secondary | ICD-10-CM | POA: Diagnosis not present

## 2022-10-18 DIAGNOSIS — I5022 Chronic systolic (congestive) heart failure: Secondary | ICD-10-CM | POA: Diagnosis present

## 2022-10-18 LAB — BASIC METABOLIC PANEL
Anion gap: 9 (ref 5–15)
BUN: 17 mg/dL (ref 8–23)
CO2: 20 mmol/L — ABNORMAL LOW (ref 22–32)
Calcium: 9.2 mg/dL (ref 8.9–10.3)
Chloride: 107 mmol/L (ref 98–111)
Creatinine, Ser: 1.4 mg/dL — ABNORMAL HIGH (ref 0.61–1.24)
GFR, Estimated: 52 mL/min — ABNORMAL LOW (ref >60)
Glucose, Bld: 103 mg/dL — ABNORMAL HIGH (ref 70–99)
Potassium: 4 mmol/L (ref 3.5–5.1)
Sodium: 136 mmol/L (ref 135–145)

## 2022-10-18 MED ORDER — ATORVASTATIN CALCIUM 80 MG PO TABS: 80.0000 mg | ORAL_TABLET | Freq: Every evening | ORAL | 3 refills | Status: DC

## 2022-10-18 MED ORDER — EMPAGLIFLOZIN 10 MG PO TABS: 10.0000 mg | ORAL_TABLET | Freq: Every day | ORAL | 3 refills | Status: DC

## 2022-10-18 MED ORDER — ENTRESTO 24-26 MG PO TABS: 1.0000 | ORAL_TABLET | Freq: Two times a day (BID) | ORAL | 3 refills | Status: DC

## 2022-10-18 MED ORDER — METOPROLOL SUCCINATE ER 25 MG PO TB24: 25.0000 mg | ORAL_TABLET | Freq: Every day | ORAL | 3 refills | Status: DC

## 2022-10-18 NOTE — Patient Instructions (Signed)
It was a pleasure seeing you today!  MEDICATIONS: -We are changing your medications today -Increase Entresto to one 24-26 mg tablet twice daily. You can go back down to one half tablet twice daily if you feel lightheaded and dizzy. Record blood pressures and bring in to clinic next visit. -Call if you have questions about your medications.  LABS: -We will call you if your labs need attention.  NEXT APPOINTMENT: Return to clinic with Dr. Daniel Nones on 11/01/22.  In general, to take care of your heart failure: -Limit your fluid intake to 2 Liters (half-gallon) per day.   -Limit your salt intake to ideally 2-3 grams (2000-3000 mg) per day. -Weigh yourself daily and record, and bring that "weight diary" to your next appointment.  (Weight gain of 2-3 pounds in 1 day typically means fluid weight.) -The medications for your heart are to help your heart and help you live longer.   -Please contact us before stopping any of your heart medications.  Call the clinic at [phone] with questions or to reschedule future appointments.

## 2022-10-18 NOTE — Progress Notes (Signed)
Advanced Heart Failure Clinic Note   Referring Physician: Rolland Porter, PA-C  Primary Care: Tillie Fantasia HF Cardiologist: Dr. Daniel Nones  HPI:  Cody Small is a 77 y.o. male with CAD (NSTEMI in 04/2020 status post PCI to the circumflex), HFrEF, type 2 diabetes, hyperlipidemia, hypertension, asthma and CKD 3A.  Cody Small reports cardiac history dating back to at least 2021 when he had an NSTEMI leading to an LVEF of 45%. He had an echocardiogram in September 2022 with EF of 40 to 45% and severe inferolateral hypokinesis. He was admitted to [facility] with severe shortness of breath; BNP>3000 in December 2023. He underwent LHC/RHC with nonobstructive CAD. RHC at that time with cardiac index 2.5L/min/m2. He was diagnosed with COVID19 during his hospitalization on July 29, 2022. He had a repeat echo on 07/27/22 where LVEF dropped further to 20-25%. Since that time he was seen in [facility] clinic and started on GDMT.    At last visit on 09/18/22 with MD, he had significant improvement in his exercise capacity after the addition of GDMT.  Today he returns to HF clinic with his daughter for pharmacist medication titration. He is overall feeling excellent. He denies dizziness, lightheadedness, and fatigue. Denies chest pain and palpitations. His breathing has been great. He takes the stairs in his house frequently with no shortness of breath. Able to complete all ADLs. Activity level is moderate. Weight at home 136-140 pounds. Has not required any prn furosemide. Denies LEE, PND, and orthopnea. He sleeps on 3 pillows for comfort rather than SOB. Appetite is good. Reports home BP is generally [id number] systolic.   HF Medications: Metoprolol succinate 25 mg daily Entresto 24-26 mg BID (taking 1/2 tablet BID) Jardiance 10 mg daily Furosemide 40 mg prn  Has the patient been experiencing any side effects to the medications prescribed?  No.  Does the patient have any problems obtaining  medications due to transportation or finances?   No. [employer] Medicare.  Understanding of regimen: good. His daughter helps manage his medications. Understanding of indications: good Potential of compliance: good Patient understands to avoid NSAIDs. Patient understands to avoid decongestants.    Pertinent Lab Values: 09/18/22: Serum creatinine 1.35, BUN 22, Potassium 3.8, Sodium 136, BNP 179.2  Bmet today pending  Vital Signs: Weight: 146.4 lbs (last clinic weight: 142 lbs) Blood pressure: 106/62 mmHg  Heart rate: 55 bpm   Assessment/Plan: Heart failure with reduced EF Etiology of HF: Ischemic cardiomyopathy; NSTEMI in 2019 -Extensive education provided today regarding medication mechanisms and disease state. NYHA class / AHA Stage:II Volume status & Diuretics: Continue furosemide '40mg'$  PO prn Vasodilators: Increase Entresto to one 24/26 mg tablet BID; BP on the softer side today, extensive instruction given to reduce back to 0.5 tablet BID if he has symptomatic hypotension. Beta-Blocker: Continue metoprolol succinate 25 mg daily given HR of 55. MRA: Hesitant to start at this time given recent K of 5.9 and recent recovery from profound AKI. Will reorder labs today and can re-consider at follow-up if labs remain stable.  Cardiometabolic: Continue Jardiance 10 mg daily Devices therapies & Valvulopathies: Not currently indicated; repeat TTE at follow up.  Advanced therapies: N/A   2. CAD - PCI to the Lcx in 2019 - Continue atorvastatin '80mg'$  and ASA '81mg'$  daily.  - LHC in 07/2022 with patent stent. - Extensive education provided regarding cholesterol management and its impact on CAD.   3. HTN - Increase Entresto today as above.  - Patient will  record home BPs and bring to next visit.   4. CKD - Recheck BMP today. - Continue Jardiance '10mg'$  daily  Follow up on 11/01/22 with Dr. Daniel Nones.  Audry Riles, PharmD, BCPS, [facility], CPP Heart Failure Clinic Pharmacist [phone]

## 2022-10-31 ENCOUNTER — Encounter (HOSPITAL_COMMUNITY): Payer: Medicare PPO | Admitting: Cardiology

## 2022-11-01 ENCOUNTER — Ambulatory Visit (HOSPITAL_COMMUNITY)
Admission: RE | Admit: 2022-11-01 | Discharge: 2022-11-01 | Disposition: A | Discharge status: Home / Self Care | Payer: Medicare PPO | Source: Ambulatory Visit | Attending: Cardiology | Admitting: Cardiology

## 2022-11-01 ENCOUNTER — Encounter (HOSPITAL_COMMUNITY): Payer: Self-pay | Admitting: Cardiology

## 2022-11-01 VITALS — BP 104/70 | HR 58 | Wt 144.0 lb

## 2022-11-01 DIAGNOSIS — N1831 Chronic kidney disease, stage 3a: Secondary | ICD-10-CM | POA: Diagnosis not present

## 2022-11-01 DIAGNOSIS — I5022 Chronic systolic (congestive) heart failure: Secondary | ICD-10-CM | POA: Diagnosis not present

## 2022-11-01 DIAGNOSIS — Z955 Presence of coronary angioplasty implant and graft: Secondary | ICD-10-CM | POA: Diagnosis not present

## 2022-11-01 DIAGNOSIS — I2510 Atherosclerotic heart disease of native coronary artery without angina pectoris: Secondary | ICD-10-CM | POA: Diagnosis not present

## 2022-11-01 DIAGNOSIS — Z79899 Other long term (current) drug therapy: Secondary | ICD-10-CM | POA: Insufficient documentation

## 2022-11-01 DIAGNOSIS — I255 Ischemic cardiomyopathy: Secondary | ICD-10-CM | POA: Diagnosis not present

## 2022-11-01 DIAGNOSIS — I252 Old myocardial infarction: Secondary | ICD-10-CM | POA: Diagnosis not present

## 2022-11-01 DIAGNOSIS — J45909 Unspecified asthma, uncomplicated: Secondary | ICD-10-CM | POA: Insufficient documentation

## 2022-11-01 DIAGNOSIS — N1832 Chronic kidney disease, stage 3b: Secondary | ICD-10-CM | POA: Diagnosis not present

## 2022-11-01 DIAGNOSIS — E1122 Type 2 diabetes mellitus with diabetic chronic kidney disease: Secondary | ICD-10-CM | POA: Insufficient documentation

## 2022-11-01 DIAGNOSIS — E785 Hyperlipidemia, unspecified: Secondary | ICD-10-CM | POA: Diagnosis not present

## 2022-11-01 DIAGNOSIS — I130 Hypertensive heart and chronic kidney disease with heart failure and stage 1 through stage 4 chronic kidney disease, or unspecified chronic kidney disease: Secondary | ICD-10-CM | POA: Insufficient documentation

## 2022-11-01 DIAGNOSIS — Z7982 Long term (current) use of aspirin: Secondary | ICD-10-CM | POA: Diagnosis not present

## 2022-11-01 LAB — BASIC METABOLIC PANEL
Anion gap: 8 (ref 5–15)
BUN: 18 mg/dL (ref 8–23)
CO2: 23 mmol/L (ref 22–32)
Calcium: 9.2 mg/dL (ref 8.9–10.3)
Chloride: 107 mmol/L (ref 98–111)
Creatinine, Ser: 1.4 mg/dL — ABNORMAL HIGH (ref 0.61–1.24)
GFR, Estimated: 52 mL/min — ABNORMAL LOW (ref >60)
Glucose, Bld: 125 mg/dL — ABNORMAL HIGH (ref 70–99)
Potassium: 4.3 mmol/L (ref 3.5–5.1)
Sodium: 138 mmol/L (ref 135–145)

## 2022-11-01 LAB — BRAIN NATRIURETIC PEPTIDE: B Natriuretic Peptide: 212.9 pg/mL — ABNORMAL HIGH (ref 0.0–100.0)

## 2022-11-01 NOTE — Progress Notes (Signed)
ADVANCED HEART FAILURE CLINIC NOTE  Referring Physician: Rolland Porter, PA-C  Primary Care: Rolland Porter, PA-C   HPI: Cody Small is a 77 y.o. male with CAD (NSTEMI in 9/21 status post PCI to the circumflex), HFrEF, type 2 diabetes, hyperlipidemia, hypertension, asthma and CKD 3A.  Cody Small reports cardiac history dating back to at least 2021 when he had an NSTEMI leading to an LVEF of 45%. He had an echocardiogram in September 2022 with EF of 40 to 45% and severe inferolateral hypokinesis. He was admitted to [facility] with severe shortness of breath; BNP>3000 in December 2023. He underwent LHC/RHC with nonobstructive CAD. RHC at that time with cardiac index 2.5L/min/m2. He was diagnosed with COVID19 during his hospitalization on July 29, 2022. He had a repeat echo on 07/27/22 where LVEF dropped further to 20-25%. Since that time he was seen in [facility] clinic and statrted on GDMT.   Interval Hx: Doing very well; no functional limitations. He helps take care of his grandkids and great grandchildren. No longer having dyspnea, PND, chest pain or lightheadedness.   Activity level/exercise tolerance:  NYHAIIB Orthopnea:  Sleeps on 2 pillows Paroxysmal noctural dyspnea:  no Chest pain/pressure:  no Orthostatic lightheadedness:  no Palpitations:  no Lower extremity edema:  no Presyncope/syncope:  no Cough:  no  Past Medical History:  Diagnosis Date   Asthma    DM2 (diabetes mellitus, type 2) (HCC)    HFrEF (heart failure with reduced ejection fraction) (HCC)    HLD (hyperlipidemia)    HTN (hypertension)    MI (myocardial infarction) (HCC)     Current Outpatient Medications  Medication Sig Dispense Refill   aspirin EC 81 MG tablet Take 81 mg by mouth daily.     atorvastatin (LIPITOR) 80 MG tablet Take 1 tablet (80 mg total) by mouth every evening. 90 tablet 3   budesonide-formoterol (SYMBICORT) 160-4.5 MCG/ACT inhaler Inhale 2 puffs into the lungs 2 (two) times  daily.     empagliflozin (JARDIANCE) 10 MG TABS tablet Take 1 tablet (10 mg total) by mouth daily. 90 tablet 3   furosemide (LASIX) 40 MG tablet Take 1 tablet (40 mg total) by mouth as needed. 30 tablet 11   hydrOXYzine (ATARAX) 10 MG tablet Take 10 mg by mouth 3 (three) times daily as needed for itching.     metoprolol succinate (TOPROL-XL) 25 MG 24 hr tablet Take 1 tablet (25 mg total) by mouth daily. 90 tablet 3   sacubitril-valsartan (ENTRESTO) 24-26 MG Take 1 tablet by mouth 2 (two) times daily. 180 tablet 3   No current facility-administered medications for this encounter.    No Known Allergies    Social History   Socioeconomic History   Marital status: Married    Spouse name: Juliann Pulse   Number of children: 3   Years of education: Not on file   Highest education level: High school graduate  Occupational History   Occupation: Retired  Tobacco Use   Smoking status: Former   Smokeless tobacco: Never  Scientific laboratory technician Use: Never used  Substance and Sexual Activity   Alcohol use: No   Drug use: Never   Sexual activity: Not on file  Other Topics Concern   Not on file  Social History Narrative   Not on file   Social Determinants of Health   Financial Resource Strain: Low Risk  (07/27/2022)   Overall Financial Resource Strain (CARDIA)    Difficulty of Paying Living Expenses:  Not hard at all  Food Insecurity: No Food Insecurity (07/27/2022)   Hunger Vital Sign    Worried About Running Out of Food in the Last Year: Never true    Ran Out of Food in the Last Year: Never true  Transportation Needs: No Transportation Needs (07/27/2022)   PRAPARE - Hydrologist (Medical): No    Lack of Transportation (Non-Medical): No  Physical Activity: Not on file  Stress: Not on file  Social Connections: Not on file  Intimate Partner Violence: Not At Risk (07/27/2022)   Humiliation, Afraid, Rape, and Kick questionnaire    Fear of Current or Ex-Partner: No     Emotionally Abused: No    Physically Abused: No    Sexually Abused: No      Family History  Problem Relation Age of Onset   Kidney failure Mother    Cancer Sister    Pancreatic cancer Brother     PHYSICAL EXAM: Vitals:   11/01/22 1042  BP: 104/70  Pulse: (!) 58  SpO2: 97%   GENERAL: Well nourished, well developed, and in no apparent distress at rest.  HEENT: Negative for arcus senilis or xanthelasma. There is no scleral icterus.  The mucous membranes are pink and moist.   NECK: Supple, No masses. Normal carotid upstrokes without bruits. No masses or thyromegaly.    CHEST: There are no chest wall deformities. There is no chest wall tenderness. Respirations are unlabored.  Lungs- cta b/l CARDIAC:  JVP: <7cm          Normal rate with regular rhythm. No murmurs, rubs or gallops.  Pulses are 2+ and symmetrical in upper and lower extremities. No edema.  ABDOMEN: Soft, non-tender, non-distended. There are no masses or hepatomegaly. There are normal bowel sounds.  EXTREMITIES: Warm and well perfused with no cyanosis, clubbing.  LYMPHATIC: No axillary or supraclavicular lymphadenopathy.  NEUROLOGIC: Patient is oriented x3 with no focal or lateralizing neurologic deficits.  PSYCH: Patients affect is appropriate, there is no evidence of anxiety or depression.  SKIN: Warm and dry; no lesions or wounds.    DATA REVIEW  ECG:08/21/22: NSR  ECHO: 07/27/22: LVEF 25%-30%; Grade III DD. Severe hypokinesis of the inferolateral wall. Moderately reduced RV function.   CATH: 07/30/22:    2nd Diag lesion is 50% stenosed.   Prox Cx to Mid Cx lesion is 10% stenosed.   Mild coronary artery disease with ostial 50% second diagonal stenosis in a large LAD system; patent left circumflex stent with minimal mid irregularity of 10%; and normal RCA.  No significant right heart pressure elevation with mild PA systolic elevation at 36 mm without without evidence for elevated pulmonary artery mean  pressure. LVEDP 5 mmHg.     ASSESSMENT & PLAN:  Heart failure with reduced EF Etiology of RJ:[id number] cardiomyopathy; NSTEMI in 20192 NYHA class / AHA Stage:II Volume status & Diuretics: Lasix '40mg'$  PO prn Vasodilators: SBP remains low; will continue Entresto 24/'26mg'$  BID.  Beta-Blocker:Toprol '25mg'$  XL daily; will uptitrate meds slowly as Mr. Mimms is concerned about multiple medication changes and associated side effects MRA: hx of hyperkalemia, will repeat labs today and plan to add on spironolactone 12.'5mg'$ .  Cardiometabolic:jardiance '10mg'$  Devices therapies & Valvulopathies:Not currently indicated; repeat TTE at follow up.  Advanced therapies:N/A  2. CAD - PCI to the Lcx in 2019 - Lipitor '80mg'$  and ASA '81mg'$  daily  - LHC in 12/23 with patent stent.  3. HTN - Continue entresto; recently increased to 24/'26mg'$   BID. Tolerating it well currently.   4. CKD - repeat labs today - Jardiance '10mg'$  daily   Monika Chestang Advanced Heart Failure Mechanical Circulatory Support

## 2022-11-01 NOTE — Patient Instructions (Signed)
There has been no changes to your medications.  Labs done today, your results will be available in MyChart, we will contact you for abnormal readings.  Your physician recommends that you schedule a follow-up appointment in: 3 months ( June) ** please call the office in April to arrange your follow up appointment. **  If you have any questions or concerns before your next appointment please send us a message through mychart or call our office at [phone].    TO LEAVE A MESSAGE FOR THE NURSE SELECT OPTION 2, PLEASE LEAVE A MESSAGE INCLUDING: YOUR NAME DATE OF BIRTH CALL BACK NUMBER REASON FOR CALL**this is important as we prioritize the call backs  YOU WILL RECEIVE A CALL BACK THE SAME DAY AS LONG AS YOU CALL BEFORE 4:00 PM  At the Advanced Heart Failure Clinic, you and your health needs are our priority. As part of our continuing mission to provide you with exceptional heart care, we have created designated Provider Care Teams. These Care Teams include your primary Cardiologist (physician) and Advanced Practice Providers (APPs- Physician Assistants and Nurse Practitioners) who all work together to provide you with the care you need, when you need it.   You may see any of the following providers on your designated Care Team at your next follow up: Dr Daniel Bensimhon Dr Dalton McLean Dr. Aditya Sabharwal Amy Clegg, NP Brittainy Simmons, PA Jessica Milford,NP Lindsay Finch, PA Alma Diaz, NP Lauren Kemp, PharmD   Please be sure to bring in all your medications bottles to every appointment.    Thank you for choosing [facility]Advanced [facility] Failure [facility]    

## 2022-11-02 ENCOUNTER — Institutional Professional Consult (permissible substitution): Payer: Medicare PPO | Admitting: Cardiology

## 2022-11-22 ENCOUNTER — Telehealth: Payer: Self-pay | Admitting: *Deleted

## 2022-12-04 ENCOUNTER — Ambulatory Visit: Payer: Medicare PPO | Admitting: Cardiology

## 2022-12-04 NOTE — Progress Notes (Deleted)
Electrophysiology Office Note to otherwise no potassium normal   Date:  12/04/2022   ID:  Cody Small, DOB 18-Nov-1945, MRN [id number]  PCP:  Charolett Bumpers, PA-C  Cardiologist:  Gillis Ends Primary Electrophysiologist:  Mordechai Matuszak Jorja Loa, MD    Chief Complaint: atrial flutter   History of Present Illness: Cody Small is a 77 y.o. male who is being seen today for the evaluation of atrial flutter at the request of Sabharwal, Aditya, DO. Presenting today for electrophysiology evaluation.  He has a history significant for coronary artery disease status post non-STEMI and PCI to the circumflex, chronic systolic heart failure, diabetes, hyperlipidemia, hypertension, asthma, CKD stage IIIa.  He was admitted to the hospital with shortness of breath and an elevated BNP.  He underwent right and left heart catheterization which showed nonobstructive disease.  He had a repeat echo that showed an ejection fraction of 20 to 25%.  He wore a cardiac monitor that showed atrial fibrillation/flutter.  Today, he denies*** symptoms of palpitations, chest pain, shortness of breath, orthopnea, PND, lower extremity edema, claudication, dizziness, presyncope, syncope, bleeding, or neurologic sequela. The patient is tolerating medications without difficulties.    Past Medical History:  Diagnosis Date   Asthma    DM2 (diabetes mellitus, type 2) (HCC)    HFrEF (heart failure with reduced ejection fraction) (HCC)    HLD (hyperlipidemia)    HTN (hypertension)    MI (myocardial infarction) [facility])    Past Surgical History:  Procedure Laterality Date   COLONOSCOPY  2021   CORONARY STENT INTERVENTION     RIGHT/LEFT HEART CATH AND CORONARY ANGIOGRAPHY N/A 07/30/2022   Procedure: RIGHT/LEFT HEART CATH AND CORONARY ANGIOGRAPHY;  Surgeon: Lennette Bihari, MD;  Location: [facility] INVASIVE CV LAB;  Service: Cardiovascular;  Laterality: N/A;     Current Outpatient Medications  Medication Sig Dispense Refill   aspirin EC  81 MG tablet Take 81 mg by mouth daily.     atorvastatin (LIPITOR) 80 MG tablet Take 1 tablet (80 mg total) by mouth every evening. 90 tablet 3   budesonide-formoterol (SYMBICORT) 160-4.5 MCG/ACT inhaler Inhale 2 puffs into the lungs 2 (two) times daily.     empagliflozin (JARDIANCE) 10 MG TABS tablet Take 1 tablet (10 mg total) by mouth daily. 90 tablet 3   furosemide (LASIX) 40 MG tablet Take 1 tablet (40 mg total) by mouth as needed. 30 tablet 11   hydrOXYzine (ATARAX) 10 MG tablet Take 10 mg by mouth 3 (three) times daily as needed for itching.     metoprolol succinate (TOPROL-XL) 25 MG 24 hr tablet Take 1 tablet (25 mg total) by mouth daily. 90 tablet 3   sacubitril-valsartan (ENTRESTO) 24-26 MG Take 1 tablet by mouth 2 (two) times daily. 180 tablet 3   No current facility-administered medications for this visit.    Allergies:   Patient has no known allergies.   Social History:  The patient  reports that he has quit smoking. He has never used smokeless tobacco. He reports that he does not drink alcohol and does not use drugs.   Family History:  The patient's family history includes Cancer in his sister; Kidney failure in his mother; Pancreatic cancer in his brother.    ROS:  Please see the history of present illness.   Otherwise, review of systems is positive for none.   All other systems are reviewed and negative.    PHYSICAL EXAM: VS:  There were no vitals taken for this visit. , BMI  There is no height or weight on file to calculate BMI. GEN: Well nourished, well developed, in no acute distress  HEENT: normal  Neck: no JVD, carotid bruits, or masses Cardiac: ***RRR; no murmurs, rubs, or gallops,no edema  Respiratory:  clear to auscultation bilaterally, normal work of breathing GI: soft, nontender, nondistended, + BS MS: no deformity or atrophy  Skin: warm and dry Neuro:  Strength and sensation are intact Psych: euthymic mood, full affect  EKG:  EKG {ACTION; IS/IS  WUJ:[id number]} ordered today. Personal review of the ekg ordered *** shows ***  Recent Labs: 08/21/2022: ALT 37 09/18/2022: Hemoglobin 12.4; Platelets 244 11/01/2022: B Natriuretic Peptide 212.9; BUN 18; Creatinine, Ser 1.40; Potassium 4.3; Sodium 138    Lipid Panel     Component Value Date/Time   CHOL 127 07/29/2022 0147   TRIG 77 07/29/2022 0147   HDL 40 (L) 07/29/2022 0147   CHOLHDL 3.2 07/29/2022 0147   VLDL 15 07/29/2022 0147   LDLCALC 72 07/29/2022 0147     Wt Readings from Last 3 Encounters:  11/01/22 144 lb (65.3 kg)  10/18/22 146 lb 6.4 oz (66.4 kg)  09/18/22 142 lb 3.2 oz (64.5 kg)      Other studies Reviewed: Additional studies/ records that were reviewed today include: TTE 07/27/22  Review of the above records today demonstrates:   1. Left ventricular ejection fraction, by estimation, is 25 to 30%. The  left ventricle has severely decreased function. The left ventricle  demonstrates regional wall motion abnormalities (see scoring  diagram/findings for description). The left  ventricular internal cavity size was mildly to moderately dilated. Left  ventricular diastolic parameters are consistent with Grade III diastolic  dysfunction (restrictive). Elevated left ventricular end-diastolic  pressure. There is severe hypokinesis of  the left ventricular, entire inferolateral wall.   2. Right ventricular systolic function is moderately reduced. The right  ventricular size is normal. There is moderately elevated pulmonary artery  systolic pressure. The estimated right ventricular systolic pressure is  58.1 mmHg.   3. Right atrial size was mildly dilated.   4. Large pleural effusion in both left and right lateral regions.   5. The mitral valve is normal in structure. Mild to moderate mitral valve  regurgitation. No evidence of mitral stenosis.   6. The aortic valve is tricuspid. There is mild calcification of the  aortic valve. There is mild thickening of the aortic  valve. Aortic valve  regurgitation is mild. Aortic valve sclerosis/calcification is present,  without any evidence of aortic  stenosis. Aortic regurgitation PHT measures 609 msec. Aortic valve mean  gradient measures 4.5 mmHg.   7. The inferior vena cava is normal in size with <50% respiratory  variability, suggesting right atrial pressure of 8 mmHg.   RHC/LHC 07/30/2022 moderate to 111   2nd Diag lesion is 50% stenosed.   Prox Cx to Mid Cx lesion is 10% stenosed. Mr. Lyndal Rainbow Cardiac monitor 09/25/2022 personally reviewed Patient had a min HR of 45 bpm, max HR of 179 bpm, and avg HR of 67 bpm.  2. Predominant underlying rhythm was Sinus Rhythm.  3. 1 run of Ventricular Tachycardia occurred lasting 4 beats with a max rate of 129 bpm (avg 106 bpm).  4. 10 Supraventricular Tachycardia runs occurred, the run with the fastest interval lasting 7 beats with a max rate of 179 bpm, the longest lasting 8 beats with an avg rate of 111 bpm. 5. Atrial Fibrillation/Flutter occurred (5% burden), ranging from 56-151 bpm (avg of 114  bpm), the longest lasting 10 hours 11 mins with an avg rate of 130 bpm.  6. Isolated SVEs were rare (<1.0%), SVE Couplets were rare (<1.0%), and SVE Triplets were rare (<1.0%). Isolated VEs were rare (<1.0%), and no VE Couplets or VE Triplets were present. Inverted QRS complexes possibly due to inverted placement of device.  ASSESSMENT AND PLAN:  1.  Chronic systolic heart failure: NYHA class II.  Currently on optimal medical therapy per heart failure cardiology.***  2.  Coronary disease: PCI to the circumflex.  No current chest pain.  3.  Hyperlipidemia: Continue Lipitor per primary cardiology  4.  Hypertension:***  5.  Paroxysmal atrial fibrillation/flutter: 5% burden on cardiac monitor.  Currently on aspirin.  CHA2DS2-VASc of 5.  Jenalee Trevizo stop aspirin and start him on Eliquis 5 mg twice daily.***Okay  Current medicines are reviewed at length with the patient today.   The  patient {ACTIONS; HAS/DOES NOT HAVE:19233} concerns regarding his medicines.  The following changes were made today:  {NONE DEFAULTED:18576}  Labs/ tests ordered today include: *** No orders of the defined types were placed in this encounter.    Disposition:   FU with Rocklin Soderquist {gen number [id number]} {Days to years:10300}  Signed, Lauryl Seyer Jorja Loa, MD  12/04/2022 8:44 AM     [facility] [address] [place] [address] [phone] (office) [phone] (fax)

## 2022-12-10 ENCOUNTER — Ambulatory Visit: Payer: Medicare PPO | Attending: Cardiology | Admitting: Cardiology

## 2022-12-10 ENCOUNTER — Encounter: Payer: Self-pay | Admitting: Cardiology

## 2022-12-10 VITALS — BP 100/62 | HR 54 | Ht 67.0 in | Wt 149.0 lb

## 2022-12-10 DIAGNOSIS — I480 Paroxysmal atrial fibrillation: Secondary | ICD-10-CM

## 2022-12-10 DIAGNOSIS — I10 Essential (primary) hypertension: Secondary | ICD-10-CM | POA: Diagnosis not present

## 2022-12-10 DIAGNOSIS — I2510 Atherosclerotic heart disease of native coronary artery without angina pectoris: Secondary | ICD-10-CM

## 2022-12-10 DIAGNOSIS — I5022 Chronic systolic (congestive) heart failure: Secondary | ICD-10-CM | POA: Diagnosis not present

## 2022-12-10 NOTE — Progress Notes (Signed)
Electrophysiology Office Note to otherwise no potassium normal   Date:  12/10/2022   ID:  Cody Small, DOB 1946-07-09, MRN [id number]  PCP:  Charolett Bumpers, PA-C  Cardiologist:  Gillis Ends Primary Electrophysiologist:  Amarrah Meinhart Jorja Loa, MD    Chief Complaint: atrial flutter   History of Present Illness: Cody Small is a 77 y.o. male who is being seen today for the evaluation of atrial flutter at the request of Sabharwal, Aditya, DO. Presenting today for electrophysiology evaluation.  He has a history significant for coronary artery disease status post non-STEMI and PCI to the circumflex, chronic systolic heart failure, diabetes, hyperlipidemia, hypertension, asthma, CKD stage IIIa.  He was admitted to the hospital with shortness of breath and an elevated BNP.  He underwent right and left heart catheterization which showed nonobstructive disease.  He had a repeat echo that showed an ejection fraction of 20 to 25%.  He wore a cardiac monitor that showed atrial fibrillation/flutter.  Today, he denies symptoms of palpitations, chest pain, shortness of breath, orthopnea, PND, lower extremity edema, claudication, dizziness, presyncope, syncope, bleeding, or neurologic sequela. The patient is tolerating medications without difficulties.    Past Medical History:  Diagnosis Date   Asthma    DM2 (diabetes mellitus, type 2)    HFrEF (heart failure with reduced ejection fraction)    HLD (hyperlipidemia)    HTN (hypertension)    MI (myocardial infarction)    Past Surgical History:  Procedure Laterality Date   COLONOSCOPY  2021   CORONARY STENT INTERVENTION     RIGHT/LEFT HEART CATH AND CORONARY ANGIOGRAPHY N/A 07/30/2022   Procedure: RIGHT/LEFT HEART CATH AND CORONARY ANGIOGRAPHY;  Surgeon: Lennette Bihari, MD;  Location: [facility];  Service: Cardiovascular;  Laterality: N/A;     Current Outpatient Medications  Medication Sig Dispense Refill   aspirin EC 81 MG tablet Take 81  mg by mouth daily.     atorvastatin (LIPITOR) 80 MG tablet Take 1 tablet (80 mg total) by mouth every evening. 90 tablet 3   budesonide-formoterol (SYMBICORT) 160-4.5 MCG/ACT inhaler Inhale 2 puffs into the lungs 2 (two) times daily.     empagliflozin (JARDIANCE) 10 MG TABS tablet Take 1 tablet (10 mg total) by mouth daily. 90 tablet 3   furosemide (LASIX) 40 MG tablet Take 1 tablet (40 mg total) by mouth as needed. 30 tablet 11   hydrOXYzine (ATARAX) 10 MG tablet Take 10 mg by mouth 3 (three) times daily as needed for itching.     metoprolol succinate (TOPROL-XL) 25 MG 24 hr tablet Take 1 tablet (25 mg total) by mouth daily. 90 tablet 3   sacubitril-valsartan (ENTRESTO) 24-26 MG Take 1 tablet by mouth 2 (two) times daily. 180 tablet 3   No current facility-administered medications for this visit.    Allergies:   Patient has no known allergies.   Social History:  The patient  reports that he has quit smoking. He has never used smokeless tobacco. He reports that he does not drink alcohol and does not use drugs.   Family History:  The patient's family history includes Cancer in his sister; Kidney failure in his mother; Pancreatic cancer in his brother.    ROS:  Please see the history of present illness.   Otherwise, review of systems is positive for none.   All other systems are reviewed and negative.    PHYSICAL EXAM: VS:  BP 100/62   Pulse (!) 54   Ht  (1.702  m)   Wt 149 lb (67.6 kg)   SpO2 99%   BMI 23.34 kg/m  , BMI Body mass index is 23.34 kg/m. GEN: Well nourished, well developed, in no acute distress  HEENT: normal  Neck: no JVD, carotid bruits, or masses Cardiac: RRR; no murmurs, rubs, or gallops,no edema  Respiratory:  clear to auscultation bilaterally, normal work of breathing GI: soft, nontender, nondistended, + BS MS: no deformity or atrophy  Skin: warm and dry Neuro:  Strength and sensation are intact Psych: euthymic mood, full affect  EKG:  EKG is ordered  today. Personal review of the ekg ordered shows sinus rhythm, TWI  Recent Labs: 08/21/2022: ALT 37 09/18/2022: Hemoglobin 12.4; Platelets 244 11/01/2022: B Natriuretic Peptide 212.9; BUN 18; Creatinine, Ser 1.40; Potassium 4.3; Sodium 138    Lipid Panel     Component Value Date/Time   CHOL 127 07/29/2022 0147   TRIG 77 07/29/2022 0147   HDL 40 (L) 07/29/2022 0147   CHOLHDL 3.2 07/29/2022 0147   VLDL 15 07/29/2022 0147   LDLCALC 72 07/29/2022 0147     Wt Readings from Last 3 Encounters:  12/10/22 149 lb (67.6 kg)  11/01/22 144 lb (65.3 kg)  10/18/22 146 lb 6.4 oz (66.4 kg)      Other studies Reviewed: Additional studies/ records that were reviewed today include: TTE 07/27/22  Review of the above records today demonstrates:   1. Left ventricular ejection fraction, by estimation, is 25 to 30%. The  left ventricle has severely decreased function. The left ventricle  demonstrates regional wall motion abnormalities (see scoring  diagram/findings for description). The left  ventricular internal cavity size was mildly to moderately dilated. Left  ventricular diastolic parameters are consistent with Grade III diastolic  dysfunction (restrictive). Elevated left ventricular end-diastolic  pressure. There is severe hypokinesis of  the left ventricular, entire inferolateral wall.   2. Right ventricular systolic function is moderately reduced. The right  ventricular size is normal. There is moderately elevated pulmonary artery  systolic pressure. The estimated right ventricular systolic pressure is  58.1 mmHg.   3. Right atrial size was mildly dilated.   4. Large pleural effusion in both left and right lateral regions.   5. The mitral valve is normal in structure. Mild to moderate mitral valve  regurgitation. No evidence of mitral stenosis.   6. The aortic valve is tricuspid. There is mild calcification of the  aortic valve. There is mild thickening of the aortic valve. Aortic valve   regurgitation is mild. Aortic valve sclerosis/calcification is present,  without any evidence of aortic  stenosis. Aortic regurgitation PHT measures 609 msec. Aortic valve mean  gradient measures 4.5 mmHg.   7. The inferior vena cava is normal in size with <50% respiratory  variability, suggesting right atrial pressure of 8 mmHg.   RHC/LHC 07/30/2022   2nd Diag lesion is 50% stenosed.   Prox Cx to Mid Cx lesion is 10% stenosed.  Cardiac monitor 09/25/2022 personally reviewed Patient had a min HR of 45 bpm, max HR of 179 bpm, and avg HR of 67 bpm.  2. Predominant underlying rhythm was Sinus Rhythm.  3. 1 run of Ventricular Tachycardia occurred lasting 4 beats with a max rate of 129 bpm (avg 106 bpm).  4. 10 Supraventricular Tachycardia runs occurred, the run with the fastest interval lasting 7 beats with a max rate of 179 bpm, the longest lasting 8 beats with an avg rate of 111 bpm. 5. Atrial Fibrillation/Flutter occurred (5%  burden), ranging from 56-151 bpm (avg of 114 bpm), the longest lasting 10 hours 11 mins with an avg rate of 130 bpm.  6. Isolated SVEs were rare (<1.0%), SVE Couplets were rare (<1.0%), and SVE Triplets were rare (<1.0%). Isolated VEs were rare (<1.0%), and no VE Couplets or VE Triplets were present. Inverted QRS complexes possibly due to inverted placement of device.  ASSESSMENT AND PLAN:  1.  Chronic systolic heart failure: NYHA class II.  Currently on optimal medical therapy per heart failure cardiology.  He has plans for cardiac MRI upcoming.  If MRI shows a persistently low ejection fraction, ICD would be beneficial.  Franki Stemen bring him back to the office if the MRI is low for further discussion.  2.  Coronary disease: PCI to the circumflex.  No current chest pain.  3.  Hyperlipidemia: Continue Lipitor per primary cardiology  4.  Hypertension: Currently well-controlled  5.  Paroxysmal atrial fibrillation/flutter: 5% burden on cardiac monitor.  Currently on aspirin.   CHA2DS2-VASc of 5.  Currently on aspirin.  He is having fortunately episodes that are less than 12 hours.  I did discuss with him the possibility of ablation.  He would like to discuss it further with his daughter.  We have given him information on this.  Current medicines are reviewed at length with the patient today.   The patient does not have concerns regarding his medicines.  The following changes were made today:  none  Labs/ tests ordered today include:  Orders Placed This Encounter  Procedures   EKG 12-Lead     Disposition:   FU with Cashus Halterman 3 months  Signed, Laytoya Ion Jorja Loa, MD  12/10/2022 12:31 PM     [facility] [address] [place] [address] [phone] (office) [phone] (fax)

## 2022-12-10 NOTE — Patient Instructions (Signed)
Medication Instructions:  Your physician recommends that you continue on your current medications as directed. Please refer to the Current Medication list given to you today.  *If you need a refill on your cardiac medications before your next appointment, please call your pharmacy*   Lab Work: None ordered If you have labs (blood work) drawn today and your tests are completely normal, you will receive your results only by: MyChart Message (if you have MyChart) OR A paper copy in the mail If you have any lab test that is abnormal or we need to change your treatment, we will call you to review the results.   Testing/Procedures: None ordered   Follow-Up: At [facility], you and your health needs are our priority.  As part of our continuing mission to provide you with exceptional heart care, we have created designated Provider Care Teams.  These Care Teams include your primary Cardiologist (physician) and Advanced Practice Providers (APPs -  Physician Assistants and Nurse Practitioners) who all work together to provide you with the care you need, when you need it.  We recommend signing up for the patient portal called "MyChart".  Sign up information is provided on this After Visit Summary.  MyChart is used to connect with patients for Virtual Visits (Telemedicine).  Patients are able to view lab/test results, encounter notes, upcoming appointments, etc.  Non-urgent messages can be sent to your provider as well.   To learn more about what you can do with MyChart, go to [email or web address].    Your next appointment:   To be  determined after MRI is reviewed  The format for your next appointment:   In Person  Provider:   Loman Brooklyn, MD    Thank you for choosing [facility]!!   Dory Horn, RN [phone]  Other Instructions  Atrial Fibrillation Atrial fibrillation (AFib) is a type of heartbeat that is irregular or fast. If you have AFib, your heart beats without  any order. This makes it hard for your heart to pump blood in a normal way. AFib may come and go, or it may become a long-lasting problem. If AFib is not treated, it can put you at higher risk for stroke, heart failure, and other heart problems. What are the causes? AFib may be caused by diseases that damage the heart's electrical system. They include: High blood pressure. Heart failure. Heart valve diseases. Heart surgery. Diabetes. Thyroid disease. Kidney disease. Lung diseases, such as pneumonia or COPD. Sleep apnea. Sometimes the cause is not known. What increases the risk? You are more likely to develop AFib if: You are older. You exercise often and very hard. You have a family history of AFib. You are male. You are Caucasian. You are overweight. You smoke. You drink a lot of alcohol. What are the signs or symptoms? Common symptoms of this condition include: A feeling that your heart is beating very fast. Chest pain or discomfort. Feeling short of breath. Suddenly feeling light-headed or weak. Getting tired easily during activity. Fainting. Sweating. In some cases, there are no symptoms. How is this treated? Medicines to: Prevent blood clots. Treat heart rate or heart rhythm problems. Using devices, such as a pacemaker, to correct heart rhythm problems. Doing surgery to remove the part of the heart that sends bad signals. Closing an area where clots can form in the heart (left atrial appendage). In some cases, your doctor will treat other underlying conditions. Follow these instructions at home: Medicines Take over-the-counter and prescription medicines only  as told by your doctor. Do not take any new medicines without first talking to your doctor. If you are taking blood thinners: Talk with your doctor before taking aspirin or NSAIDs, such as ibuprofen. Take your medicines as told. Take them at the same time each day. Do not do things that could hurt or bruise  you. Be careful to avoid falls. Wear an alert bracelet or carry a card that says you take blood thinners. Lifestyle Do not smoke or use any products that contain nicotine or tobacco. If you need help quitting, ask your doctor. Eat heart-healthy foods. Talk with your doctor about the right eating plan for you. Exercise regularly as told by your doctor. Do not drink alcohol. Lose weight if you are overweight. General instructions If you have sleep apnea, treat it as told by your doctor. Do not use diet pills unless your doctor says they are safe for you. Diet pills may make heart problems worse. Keep all follow-up visits. Your doctor will check your heart rate and rhythm regularly. Contact a doctor if: You notice a change in the speed, rhythm, or strength of your heartbeat. You are taking a blood-thinning medicine and you get more bruising. You get tired more easily when you move or exercise. You have a sudden change in weight. Get help right away if:  You have pain in your chest. You have trouble breathing. You have side effects of blood thinners, such as blood in your vomit, poop (stool), or pee (urine), or bleeding that cannot stop. You have any signs of a stroke. "BE FAST" is an easy way to remember the main warning signs: B - Balance. Dizziness, sudden trouble walking, or loss of balance. E - Eyes. Trouble seeing or a change in how you see. F - Face. Sudden weakness or loss of feeling in the face. The face or eyelid may droop on one side. A - Arms.Weakness or loss of feeling in an arm. This happens suddenly and usually on one side of the body. S - Speech. Sudden trouble speaking, slurred speech, or trouble understanding what people say. T - Time.Time to call emergency services. Write down what time symptoms started. You have other signs of a stroke, such as: A sudden, very bad headache with no known cause. Feeling like you may vomit (nausea). Vomiting. A seizure. These symptoms  may be an emergency. Get help right away. Call 911. Do not wait to see if the symptoms will go away. Do not drive yourself to the hospital. This information is not intended to replace advice given to you by your health care provider. Make sure you discuss any questions you have with your health care provider. Document Revised: 04/25/2022 Document Reviewed: 04/25/2022 Elsevier Patient Education  2023 Elsevier Inc.    Cardiac Ablation Cardiac ablation is a procedure to destroy (ablate) heart tissue that is sending bad signals. These bad signals cause the heart to beat very fast or in a way that is not normal. Destroying some tissues can help make the heart rhythm normal. Tell your doctor about: Any allergies you have. All medicines you are taking. These include vitamins, herbs, eye drops, creams, and over-the-counter medicines. Any problems you or family members have had with anesthesia. Any bleeding problems you have. Any surgeries you have had. Any medical conditions you have. Whether you are pregnant or may be pregnant. What are the risks? Your doctor will talk with you about risks. These may include: Infection. Bruising and bleeding. Stroke or blood  clots. Damage to nearby areas of your body. Allergies to medicines or dyes. Needing a pacemaker if the heart gets damaged. A pacemaker helps the heart beat normally. The procedure not working. What happens before the procedure? Medicines Ask your doctor about changing or stopping: Your normal medicines. Vitamins, herbs, and supplements. Over-the-counter medicines. Do not take aspirin or ibuprofen unless you are told to. General instructions Follow instructions from your doctor about what you may eat and drink. If you will be going home right after the procedure, plan to have a responsible adult: Take you home from the hospital or clinic. You will not be allowed to drive. Care for you for the time you are told. Ask your doctor  what steps will be taken to prevent the spread of germs. What happens during the procedure?  An IV tube will be put into one of your veins. You may be given: A sedative. This helps you relax. Anesthesia. This will: Numb certain areas of your body. The skin on your neck or groin will be numbed. A cut (incision) will be made in your neck or groin. A needle will be put through the cut and into a large vein. The small, thin tube (catheter) will be put into the needle. The tube will be moved to your heart. A type of X-ray (fluoroscopy) will be used to help guide the tube. It will also show constant images of the heart on a screen. Dye may be put through the tube. This helps your doctor see your heart. An electric current will be sent from the tube to destroy heart tissue in certain areas. The tube will be taken out. Pressure will be held on your cut. This helps stop bleeding. A bandage (dressing) will be put over your cut. The procedure may vary among doctors and hospitals. What happens after the procedure? You will be monitored until you leave the hospital or clinic. This includes checking your blood pressure, heart rate and rhythm, breathing rate, and blood oxygen level. Your cut will be checked for bleeding. You will need to lie still for a few hours. If your groin was used, you will need to keep your leg straight for a few hours after the small, thin tube is removed. This information is not intended to replace advice given to you by your health care provider. Make sure you discuss any questions you have with your health care provider. Document Revised: 01/23/2022 Document Reviewed: 01/23/2022 Elsevier Patient Education  2023 [employer].

## 2022-12-19 ENCOUNTER — Telehealth (HOSPITAL_COMMUNITY): Payer: Self-pay | Admitting: *Deleted

## 2022-12-19 ENCOUNTER — Telehealth (HOSPITAL_COMMUNITY): Payer: Self-pay | Admitting: Emergency Medicine

## 2022-12-19 NOTE — Telephone Encounter (Signed)
Attempted to call patient regarding upcoming cardiac MR appointment. Left message on voicemail with name and callback number Johnta Couts RN Navigator Cardiac Imaging  [facility] Services [phone] Office [phone] Cell  

## 2022-12-19 NOTE — Telephone Encounter (Signed)
Patient returning call about his upcoming cardiac imaging study; pt verbalizes understanding of appt date/time, parking situation and where to check in, and verified current allergies; name and call back number provided for further questions should they arise  Larey Brick RN Navigator Cardiac Imaging [facility] [phone] office [phone] cell.  Patient denies metal but does report a stent in his heart.

## 2022-12-20 ENCOUNTER — Other Ambulatory Visit (HOSPITAL_COMMUNITY): Payer: Self-pay | Admitting: Cardiology

## 2022-12-20 ENCOUNTER — Ambulatory Visit (HOSPITAL_COMMUNITY)
Admission: RE | Admit: 2022-12-20 | Discharge: 2022-12-20 | Disposition: A | Discharge status: Home / Self Care | Payer: Medicare PPO | Source: Ambulatory Visit | Attending: Cardiology | Admitting: Cardiology

## 2022-12-20 DIAGNOSIS — I5022 Chronic systolic (congestive) heart failure: Secondary | ICD-10-CM

## 2022-12-20 MED ORDER — GADOBUTROL 1 MMOL/ML IV SOLN
10.0000 mL | Freq: Once | INTRAVENOUS | Status: AC | PRN
  Administered 2022-12-20: 10 mL via INTRAVENOUS

## 2023-01-25 ENCOUNTER — Telehealth (HOSPITAL_COMMUNITY): Payer: Self-pay | Admitting: Emergency Medicine

## 2023-01-25 DIAGNOSIS — I5022 Chronic systolic (congestive) heart failure: Secondary | ICD-10-CM

## 2023-01-25 NOTE — Telephone Encounter (Signed)
Please contact patient about results for CMR  [phone]  Thank you, Rockwell Alexandria RN Navigator Cardiac Imaging [facility] [phone] Office  [phone] Cell

## 2023-01-29 NOTE — Telephone Encounter (Signed)
Patient advised and verbalized understanding. Order placed

## 2023-01-29 NOTE — Telephone Encounter (Signed)
Follow up scheduled

## 2023-02-20 ENCOUNTER — Encounter (HOSPITAL_COMMUNITY)
Admission: RE | Admit: 2023-02-20 | Discharge: 2023-02-20 | Disposition: A | Discharge status: Home / Self Care | Payer: Medicare PPO | Source: Ambulatory Visit | Attending: Cardiology | Admitting: Cardiology

## 2023-02-20 DIAGNOSIS — I5022 Chronic systolic (congestive) heart failure: Secondary | ICD-10-CM | POA: Insufficient documentation

## 2023-02-20 MED ORDER — TECHNETIUM TC 99M PYROPHOSPHATE
20.9000 | Freq: Once | INTRAVENOUS | Status: AC | PRN
  Administered 2023-02-20: 20.9 via INTRAVENOUS
  Filled 2023-02-20: qty 21

## 2023-02-26 ENCOUNTER — Telehealth (HOSPITAL_COMMUNITY): Payer: Self-pay

## 2023-02-26 NOTE — Telephone Encounter (Addendum)
Spoke with patient have appointment rescheduled   ----- Message from Dorthula Nettles, DO sent at 02/22/2023  6:38 PM EDT ----- Can we please move Mr. 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   Thanks,  [employer]

## 2023-03-29 ENCOUNTER — Encounter (HOSPITAL_COMMUNITY): Payer: Self-pay | Admitting: Cardiology

## 2023-03-29 ENCOUNTER — Ambulatory Visit (HOSPITAL_COMMUNITY)
Admission: RE | Admit: 2023-03-29 | Discharge: 2023-03-29 | Disposition: A | Discharge status: Home / Self Care | Payer: Medicare PPO | Source: Ambulatory Visit | Attending: Cardiology | Admitting: Cardiology

## 2023-03-29 VITALS — BP 110/78 | HR 54 | Wt 153.8 lb

## 2023-03-29 DIAGNOSIS — Z7984 Long term (current) use of oral hypoglycemic drugs: Secondary | ICD-10-CM | POA: Diagnosis not present

## 2023-03-29 DIAGNOSIS — E785 Hyperlipidemia, unspecified: Secondary | ICD-10-CM | POA: Insufficient documentation

## 2023-03-29 DIAGNOSIS — Z79899 Other long term (current) drug therapy: Secondary | ICD-10-CM | POA: Diagnosis not present

## 2023-03-29 DIAGNOSIS — Z955 Presence of coronary angioplasty implant and graft: Secondary | ICD-10-CM | POA: Insufficient documentation

## 2023-03-29 DIAGNOSIS — J45909 Unspecified asthma, uncomplicated: Secondary | ICD-10-CM | POA: Insufficient documentation

## 2023-03-29 DIAGNOSIS — I5022 Chronic systolic (congestive) heart failure: Secondary | ICD-10-CM | POA: Insufficient documentation

## 2023-03-29 DIAGNOSIS — I2510 Atherosclerotic heart disease of native coronary artery without angina pectoris: Secondary | ICD-10-CM | POA: Diagnosis not present

## 2023-03-29 DIAGNOSIS — Z9861 Coronary angioplasty status: Secondary | ICD-10-CM | POA: Diagnosis not present

## 2023-03-29 DIAGNOSIS — Z7951 Long term (current) use of inhaled steroids: Secondary | ICD-10-CM | POA: Insufficient documentation

## 2023-03-29 DIAGNOSIS — N1831 Chronic kidney disease, stage 3a: Secondary | ICD-10-CM | POA: Insufficient documentation

## 2023-03-29 DIAGNOSIS — I255 Ischemic cardiomyopathy: Secondary | ICD-10-CM | POA: Diagnosis not present

## 2023-03-29 DIAGNOSIS — Z7982 Long term (current) use of aspirin: Secondary | ICD-10-CM | POA: Insufficient documentation

## 2023-03-29 DIAGNOSIS — I130 Hypertensive heart and chronic kidney disease with heart failure and stage 1 through stage 4 chronic kidney disease, or unspecified chronic kidney disease: Secondary | ICD-10-CM | POA: Insufficient documentation

## 2023-03-29 DIAGNOSIS — E1122 Type 2 diabetes mellitus with diabetic chronic kidney disease: Secondary | ICD-10-CM | POA: Diagnosis not present

## 2023-03-29 DIAGNOSIS — I252 Old myocardial infarction: Secondary | ICD-10-CM | POA: Insufficient documentation

## 2023-03-29 LAB — BASIC METABOLIC PANEL
Anion gap: 5 (ref 5–15)
BUN: 21 mg/dL (ref 8–23)
CO2: 25 mmol/L (ref 22–32)
Calcium: 9.6 mg/dL (ref 8.9–10.3)
Chloride: 107 mmol/L (ref 98–111)
Creatinine, Ser: 1.64 mg/dL — ABNORMAL HIGH (ref 0.61–1.24)
GFR, Estimated: 43 mL/min — ABNORMAL LOW (ref >60)
Glucose, Bld: 121 mg/dL — ABNORMAL HIGH (ref 70–99)
Potassium: 4.4 mmol/L (ref 3.5–5.1)
Sodium: 137 mmol/L (ref 135–145)

## 2023-03-29 LAB — LIPID PANEL
Cholesterol: 120 mg/dL (ref 0–200)
HDL: 63 mg/dL (ref >40)
LDL Cholesterol: 46 mg/dL (ref 0–99)
Total CHOL/HDL Ratio: 1.9 RATIO
Triglycerides: 57 mg/dL (ref <150)
VLDL: 11 mg/dL (ref 0–40)

## 2023-03-29 LAB — BRAIN NATRIURETIC PEPTIDE: B Natriuretic Peptide: 196.2 pg/mL — ABNORMAL HIGH (ref 0.0–100.0)

## 2023-03-29 MED ORDER — SPIRONOLACTONE 25 MG PO TABS: 12.5000 mg | ORAL_TABLET | Freq: Every day | ORAL | 3 refills | Status: DC

## 2023-03-29 NOTE — Progress Notes (Signed)
ADVANCED HEART FAILURE CLINIC NOTE  Referring Physician: Charolett Bumpers, PA-C  Primary Care: Genevive Bi, PA-C   HPI: Cody Small is a 77 y.o. male with CAD (NSTEMI in 9/21 status post PCI to the circumflex), HFrEF, type 2 diabetes, hyperlipidemia, hypertension, asthma and CKD 3A.  Cody Small reports cardiac history dating back to at least 2021 when he had an NSTEMI leading to an LVEF of 45%. He had an echocardiogram in September 2022 with EF of 40 to 45% and severe inferolateral hypokinesis. He was admitted to [facility] with severe shortness of breath; BNP>3000 in December 2023. He underwent LHC/RHC with nonobstructive CAD. RHC at that time with cardiac index 2.5L/min/m2. He was diagnosed with COVID19 during his hospitalization on July 29, 2022. He had a repeat echo on 07/27/22 where LVEF dropped further to 20-25%. Since that time he was seen in [facility] clinic and statrted on GDMT.   Interval Hx: He has been doing very well from a functional standpoint. He has had no SOB, chest pain or lightheadedness. He takes care of his 4 grandchildren with no reported limitations. "I feel better than I have in a long time now".   Activity level/exercise tolerance:  NYHAII Orthopnea:  Sleeps on 2 pillows Paroxysmal noctural dyspnea:  no Chest pain/pressure:  no Orthostatic lightheadedness:  no Palpitations:  no Lower extremity edema:  no Presyncope/syncope:  no Cough:  no  Past Medical History:  Diagnosis Date   Asthma    DM2 (diabetes mellitus, type 2) (HCC)    HFrEF (heart failure with reduced ejection fraction) (HCC)    HLD (hyperlipidemia)    HTN (hypertension)    MI (myocardial infarction) (HCC)     Current Outpatient Medications  Medication Sig Dispense Refill   aspirin EC 81 MG tablet Take 81 mg by mouth daily.     atorvastatin (LIPITOR) 80 MG tablet Take 1 tablet (80 mg total) by mouth every evening. 90 tablet 3   budesonide-formoterol (SYMBICORT) 160-4.5 MCG/ACT  inhaler Inhale 2 puffs into the lungs 2 (two) times daily.     empagliflozin (JARDIANCE) 10 MG TABS tablet Take 1 tablet (10 mg total) by mouth daily. 90 tablet 3   furosemide (LASIX) 40 MG tablet Take 1 tablet (40 mg total) by mouth as needed. 30 tablet 11   hydrOXYzine (ATARAX) 10 MG tablet Take 10 mg by mouth 3 (three) times daily as needed for itching.     metoprolol succinate (TOPROL-XL) 25 MG 24 hr tablet Take 1 tablet (25 mg total) by mouth daily. 90 tablet 3   sacubitril-valsartan (ENTRESTO) 24-26 MG Take 1 tablet by mouth 2 (two) times daily. 180 tablet 3   No current facility-administered medications for this encounter.    No Known Allergies    Social History   Socioeconomic History   Marital status: Married    Spouse name: Olegario Messier   Number of children: 3   Years of education: Not on file   Highest education level: High school graduate  Occupational History   Occupation: Retired  Tobacco Use   Smoking status: Former   Smokeless tobacco: Never  Advertising account planner   Vaping status: Never Used  Substance and Sexual Activity   Alcohol use: No   Drug use: Never   Sexual activity: Not on file  Other Topics Concern   Not on file  Social History Narrative   Not on file   Social Determinants of Health   Financial Resource Strain: Low Risk  (  07/27/2022)   Overall Financial Resource Strain (CARDIA)    Difficulty of Paying Living Expenses: Not hard at all  Food Insecurity: Low Risk  (08/30/2022)   [facility]   Food vital sign    Within the past 12 months, you worried that your food would run out before you got money to buy more: Never true    Within the past 12 months, the food you bought just didn't last and you didn't have money to get more: Not on file  Transportation Needs: No Transportation Needs (08/30/2022)   [facility]   Transportation    In the past 12 months, has lack of reliable transportation kept you from  medical appointments, meetings, work or from getting things needed for daily living? : No  Physical Activity: Unknown (06/14/2020)   [facility] visits prior to 10/20/2022., [facility] visits prior to 10/20/2022.   Exercise Vital Sign    Days of Exercise per Week: 3 days    Minutes of Exercise per Session: Not on file  Stress: No Stress Concern Present (06/14/2020)   [facility] prior to 10/20/2022., [facility] visits prior to 10/20/2022.   [employer] of Occupational Health - Occupational Stress Questionnaire    Feeling of Stress : Not at all  Social Connections: Unknown (06/14/2020)   [facility] prior to 10/20/2022., [facility] visits prior to 10/20/2022.   Social Advertising account executive [NHANES]    Frequency of Communication with Friends and Family: More than three times a week    Frequency of Social Gatherings with Friends and Family: More than three times a week    Attends Religious Services: Not on Marketing executive or Organizations: No    Attends Banker Meetings: Never    Marital Status: Married  Catering manager Violence: Not At Risk (07/27/2022)   Humiliation, Afraid, Rape, and Kick questionnaire    Fear of Current or Ex-Partner: No    Emotionally Abused: No    Physically Abused: No    Sexually Abused: No      Family History  Problem Relation Age of Onset   Kidney failure Mother    Cancer Sister    Pancreatic cancer Brother     PHYSICAL EXAM: Vitals:   03/29/23 1023  BP: 110/78  Pulse: (!) 54  SpO2: 100%   GENERAL: Well nourished, well developed, and in no apparent distress at rest.  HEENT: Negative for arcus senilis or xanthelasma. There is no scleral icterus.  The mucous membranes are pink and moist.   NECK: Supple, No masses. Normal carotid  upstrokes without bruits. No masses or thyromegaly.    CHEST: There are no chest wall deformities. There is no chest wall tenderness. Respirations are unlabored.  Lungs- CTA B/L CARDIAC:  JVP: 7 cm          Normal rate with regular rhythm. No murmurs, rubs or gallops.  Pulses are 2+ and symmetrical in upper and lower extremities. No edema.  ABDOMEN: Soft, non-tender, non-distended. There are no masses or hepatomegaly. There are normal bowel sounds.  EXTREMITIES: Warm and well perfused with no cyanosis, clubbing.  LYMPHATIC: No axillary or supraclavicular lymphadenopathy.  NEUROLOGIC: Patient is oriented x3 with no focal or lateralizing neurologic deficits.  PSYCH: Patients affect is  appropriate, there is no evidence of anxiety or depression.  SKIN: Warm and dry; no lesions or wounds.     DATA REVIEW  ECG:08/21/22: NSR  ECHO: 07/27/22: LVEF 25%-30%; Grade III DD. Severe hypokinesis of the inferolateral wall. Moderately reduced RV function.   CATH: 07/30/22:    2nd Diag lesion is 50% stenosed.   Prox Cx to Mid Cx lesion is 10% stenosed.   Mild coronary artery disease with ostial 50% second diagonal stenosis in a large LAD system; patent left circumflex stent with minimal mid irregularity of 10%; and normal RCA.  No significant right heart pressure elevation with mild PA systolic elevation at 36 mm without without evidence for elevated pulmonary artery mean pressure. LVEDP 5 mmHg.   CMR: 12/20/22:  1. Normal LV size with wall motion abnormalities as noted above. LV EF 37%.  2.  Normal RV size with mild hypokinesis, EF 40%.  3. Delayed enhancement pattern suggests prior MI in the circumflex territory.  4. Extracellular volume percentage calculates surprisingly high at 41% though absolute T1 value is not markedly high at 1066. There are no delayed enhancement findings that suggest cardiac amyloidosis and wall thickness is normal. However, witih the high ECV value, it would be worthwhile  considering cardiac amyloidosis workup to rule this out.  ASSESSMENT & PLAN:  Heart failure with reduced EF Etiology of WU:XLKGMWNU cardiomyopathy; NSTEMI in 2019. 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  NYHA class / AHA Stage:II Volume status & Diuretics: Lasix 40mg  PO prn Vasodilators: SBP remains low; will continue Entresto 24/26mg  BID.  Beta-Blocker:Toprol 25mg  XL daily; bradycardic to 60s; will hold off on further uptitration. MRA: start spironolactone 12.5mg ; repeat labs today and in 7 days.  Cardiometabolic:jardiance 10mg  Devices therapies & Valvulopathies: Not required; LVEF 37% by CMR.  Advanced therapies:N/A  2. CAD - PCI to the Lcx in 2019 - Lipitor 80mg  and ASA 81mg  daily  - LHC in 12/23 with patent stent.  3. HTN - Continue entresto; recently increased to 24/26mg  BID. Tolerating it well currently.   4. CKD - repeat labs today - Jardiance 10mg  daily     Advanced Heart Failure Mechanical Circulatory Support

## 2023-03-29 NOTE — Patient Instructions (Signed)
Medication Changes:  START SPIRONOLACTONE 12.5MG  DAILY   Lab Work:  Labs done today, your results will be available in MyChart, we will contact you for abnormal readings.  THEN RETURN IN 7 DAYS AS SCHEDULED FOR REPEAT BLOOD WORK   Testing/Procedures:  You have been ordered a PYP Scan.  This is done at [facility], they will call you to schedule.  When you come for this test please plan to be there 2-3 hours.  They are located at: [address] [place], [place] [address]  Follow-Up in: 3 MONTHS WITH DR. Gasper Lloyd PLEASE CALL OUR OFFICE AROUND SEPTEMBER TO GET SCHEDULED FOR YOUR APPOINTMENT. PHONE NUMBER IS [phone] OPTION 2   At the Advanced Heart Failure Clinic, you and your health needs are our priority. We have a designated team specialized in the treatment of Heart Failure. This Care Team includes your primary Heart Failure Specialized Cardiologist (physician), Advanced Practice Providers (APPs- Physician Assistants and Nurse Practitioners), and Pharmacist who all work together to provide you with the care you need, when you need it.   You may see any of the following providers on your designated Care Team at your next follow up:  Dr. Arvilla Meres Dr. Marca Ancona Dr. Marcos Eke, NP Robbie Lis, [place] [facility] [place], [place] Brynda Peon, NP Karle Plumber, PharmD   Please be sure to bring in all your medications bottles to every appointment.   Need to Contact us:  If you have any questions or concerns before your next appointment please send [place] a message through [place] or call our office at [phone].    TO LEAVE A MESSAGE FOR THE NURSE SELECT OPTION 2, PLEASE LEAVE A MESSAGE INCLUDING: YOUR NAME DATE OF BIRTH CALL BACK NUMBER REASON FOR CALL**this is important as we prioritize the call backs  YOU WILL RECEIVE A CALL BACK THE SAME DAY AS LONG AS YOU CALL BEFORE 4:00 PM

## 2023-04-03 ENCOUNTER — Telehealth (HOSPITAL_COMMUNITY): Payer: Self-pay | Admitting: *Deleted

## 2023-04-03 NOTE — Telephone Encounter (Signed)
Pt reached and given instructions for Amyloid Study.

## 2023-04-08 ENCOUNTER — Ambulatory Visit (HOSPITAL_COMMUNITY)
Admission: RE | Admit: 2023-04-08 | Discharge: 2023-04-08 | Disposition: A | Discharge status: Home / Self Care | Payer: Medicare PPO | Source: Ambulatory Visit | Attending: Internal Medicine | Admitting: Internal Medicine

## 2023-04-08 DIAGNOSIS — I5022 Chronic systolic (congestive) heart failure: Secondary | ICD-10-CM | POA: Insufficient documentation

## 2023-04-08 LAB — BASIC METABOLIC PANEL
Anion gap: 7 (ref 5–15)
BUN: 23 mg/dL (ref 8–23)
CO2: 22 mmol/L (ref 22–32)
Calcium: 9 mg/dL (ref 8.9–10.3)
Chloride: 109 mmol/L (ref 98–111)
Creatinine, Ser: 1.63 mg/dL — ABNORMAL HIGH (ref 0.61–1.24)
GFR, Estimated: 44 mL/min — ABNORMAL LOW (ref >60)
Glucose, Bld: 125 mg/dL — ABNORMAL HIGH (ref 70–99)
Potassium: 4.1 mmol/L (ref 3.5–5.1)
Sodium: 138 mmol/L (ref 135–145)

## 2023-04-09 ENCOUNTER — Telehealth (HOSPITAL_COMMUNITY): Payer: Self-pay

## 2023-04-09 DIAGNOSIS — I5022 Chronic systolic (congestive) heart failure: Secondary | ICD-10-CM

## 2023-04-09 NOTE — Telephone Encounter (Signed)
-----   Message from [facility] sent at 04/08/2023  6:15 PM EDT ----- Can we please refer [name] to hematology for abnormal myeloma panel. He did not get all the labs he needed unfortunately.   Adi

## 2023-04-09 NOTE — Telephone Encounter (Signed)
 Spoke with patient regarding the following results. Patient made aware and patient verbalized understanding.   Referral placed.   Advised patient to call back to office with any issues, questions, or concerns. Patient verbalized understanding.

## 2023-04-11 ENCOUNTER — Ambulatory Visit (HOSPITAL_COMMUNITY): Payer: Medicare PPO | Attending: Cardiology

## 2023-04-11 DIAGNOSIS — I2510 Atherosclerotic heart disease of native coronary artery without angina pectoris: Secondary | ICD-10-CM

## 2023-04-11 DIAGNOSIS — I5022 Chronic systolic (congestive) heart failure: Secondary | ICD-10-CM | POA: Diagnosis present

## 2023-04-11 DIAGNOSIS — Z9861 Coronary angioplasty status: Secondary | ICD-10-CM | POA: Diagnosis present

## 2023-04-11 LAB — MYOCARDIAL AMYLOID PLANAR & SPECT: H/CL Ratio: 1.6

## 2023-04-11 MED ORDER — TECHNETIUM TC 99M PYROPHOSPHATE
21.8000 | Freq: Once | INTRAVENOUS | Status: AC
  Administered 2023-04-11: 21.8 via INTRAVENOUS

## 2023-04-16 ENCOUNTER — Encounter (HOSPITAL_COMMUNITY): Payer: Self-pay | Admitting: *Deleted

## 2023-04-16 ENCOUNTER — Telehealth (HOSPITAL_COMMUNITY): Payer: Self-pay | Admitting: *Deleted

## 2023-04-16 NOTE — Telephone Encounter (Signed)
Called patient per Dr. Gasper Lloyd and left message asking him to call us back re: myocardial amyloid imaging. Follow up appointment made with Dr. Gasper Lloyd to discuss these results. Letter sent to patient with appointment information. Asked him to call us at [phone] option 2 if any questions.

## 2023-04-17 ENCOUNTER — Encounter (HOSPITAL_COMMUNITY): Payer: Medicare PPO | Admitting: Cardiology

## 2023-04-17 NOTE — Telephone Encounter (Signed)
Pt aware of appt details 

## 2023-04-18 ENCOUNTER — Inpatient Hospital Stay: Payer: Medicare PPO | Admitting: Hematology & Oncology

## 2023-04-18 ENCOUNTER — Inpatient Hospital Stay: Payer: Medicare PPO | Attending: Internal Medicine

## 2023-04-26 ENCOUNTER — Inpatient Hospital Stay (HOSPITAL_BASED_OUTPATIENT_CLINIC_OR_DEPARTMENT_OTHER): Payer: Medicare PPO | Admitting: Hematology & Oncology

## 2023-04-26 ENCOUNTER — Other Ambulatory Visit: Payer: Self-pay

## 2023-04-26 ENCOUNTER — Encounter: Payer: Self-pay | Admitting: Hematology & Oncology

## 2023-04-26 ENCOUNTER — Inpatient Hospital Stay: Payer: Medicare PPO | Attending: Internal Medicine

## 2023-04-26 VITALS — BP 91/42 | HR 53 | Temp 98.2°F | Resp 18 | Ht 67.0 in | Wt 155.8 lb

## 2023-04-26 DIAGNOSIS — I110 Hypertensive heart disease with heart failure: Secondary | ICD-10-CM | POA: Diagnosis not present

## 2023-04-26 DIAGNOSIS — Z7984 Long term (current) use of oral hypoglycemic drugs: Secondary | ICD-10-CM | POA: Insufficient documentation

## 2023-04-26 DIAGNOSIS — Z7951 Long term (current) use of inhaled steroids: Secondary | ICD-10-CM | POA: Diagnosis not present

## 2023-04-26 DIAGNOSIS — I5022 Chronic systolic (congestive) heart failure: Secondary | ICD-10-CM | POA: Diagnosis not present

## 2023-04-26 DIAGNOSIS — E119 Type 2 diabetes mellitus without complications: Secondary | ICD-10-CM | POA: Insufficient documentation

## 2023-04-26 DIAGNOSIS — N1832 Chronic kidney disease, stage 3b: Secondary | ICD-10-CM

## 2023-04-26 DIAGNOSIS — R768 Other specified abnormal immunological findings in serum: Secondary | ICD-10-CM | POA: Diagnosis not present

## 2023-04-26 DIAGNOSIS — Z79899 Other long term (current) drug therapy: Secondary | ICD-10-CM | POA: Insufficient documentation

## 2023-04-26 DIAGNOSIS — Z87891 Personal history of nicotine dependence: Secondary | ICD-10-CM | POA: Diagnosis not present

## 2023-04-26 DIAGNOSIS — Z7982 Long term (current) use of aspirin: Secondary | ICD-10-CM | POA: Diagnosis not present

## 2023-04-26 LAB — CMP (CANCER CENTER ONLY)
ALT: 17 U/L (ref 0–44)
AST: 19 U/L (ref 15–41)
Albumin: 3.6 g/dL (ref 3.5–5.0)
Alkaline Phosphatase: 90 U/L (ref 38–126)
Anion gap: 7 (ref 5–15)
BUN: 21 mg/dL (ref 8–23)
CO2: 25 mmol/L (ref 22–32)
Calcium: 9.3 mg/dL (ref 8.9–10.3)
Chloride: 107 mmol/L (ref 98–111)
Creatinine: 1.64 mg/dL — ABNORMAL HIGH (ref 0.61–1.24)
GFR, Estimated: 43 mL/min — ABNORMAL LOW (ref >60)
Glucose, Bld: 166 mg/dL — ABNORMAL HIGH (ref 70–99)
Potassium: 4 mmol/L (ref 3.5–5.1)
Sodium: 139 mmol/L (ref 135–145)
Total Bilirubin: 0.6 mg/dL (ref 0.3–1.2)
Total Protein: 8.5 g/dL — ABNORMAL HIGH (ref 6.5–8.1)

## 2023-04-26 LAB — CBC WITH DIFFERENTIAL (CANCER CENTER ONLY)
Abs Immature Granulocytes: 0.02 10*3/uL (ref 0.00–0.07)
Basophils Absolute: 0.1 10*3/uL (ref 0.0–0.1)
Basophils Relative: 1 %
Eosinophils Absolute: 0.4 10*3/uL (ref 0.0–0.5)
Eosinophils Relative: 6 %
HCT: 44.4 % (ref 39.0–52.0)
Hemoglobin: 14.6 g/dL (ref 13.0–17.0)
Immature Granulocytes: 0 %
Lymphocytes Relative: 35 %
Lymphs Abs: 2.8 10*3/uL (ref 0.7–4.0)
MCH: 29.2 pg (ref 26.0–34.0)
MCHC: 32.9 g/dL (ref 30.0–36.0)
MCV: 88.8 fL (ref 80.0–100.0)
Monocytes Absolute: 0.6 10*3/uL (ref 0.1–1.0)
Monocytes Relative: 7 %
Neutro Abs: 4.1 10*3/uL (ref 1.7–7.7)
Neutrophils Relative %: 51 %
Platelet Count: 187 10*3/uL (ref 150–400)
RBC: 5 MIL/uL (ref 4.22–5.81)
RDW: 14.7 % (ref 11.5–15.5)
WBC Count: 8 10*3/uL (ref 4.0–10.5)
nRBC: 0 % (ref 0.0–0.2)

## 2023-04-26 LAB — LACTATE DEHYDROGENASE: LDH: 157 U/L (ref 98–192)

## 2023-04-26 NOTE — Progress Notes (Signed)
Referral MD  Reason for Referral: Evaluate for the possibility of cardiac amyloidosis  Chief Complaint  Patient presents with   New Patient (Initial Visit)  : I am not sure why I am here.  HPI: [name] is a very nice 77 year old African-American male.  He served our country in Licensed conveyancer.  He was in [place] during [place].  Again, I thanked him for his service.  He does have cardiac issues.  He is followed by cardiology.  Apparently, they are worried about him having cardiac amyloid.  Again, I am not sure if this is amyloidosis secondary to light chains or if this is cardiac amyloid secondary to ATTR..  We did have a amyloid imaging scan on 04/11/2023.  This apparently was positive for uptake.  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  He did have a slightly elevated IgG level of 3800 mg/dL.  He did have a cardiac MRI back in May.  This showed greater than 50% wall thickness.  He had hypokinesis with a left ventricle ejection fraction of 37%.  The right ventricular ejection fraction was 40%.  He had a normal right ventricle size.  It was felt that there was no obvious enhancement that would suggest cardiac amyloidosis.  However, there was a high ECV which suggested that cardiac amyloidosis needs to be ruled out.  He has had no problems with swallowing.  He has had no problem with shoulders.  He has had no rashes.  He has had no issues with COVID.  We have I think he is diabetic.  He has had no weight loss or weight gain.  He has had no obvious change in bowel or bladder habits.  Overall, I would say that his performance status is probably ECOG 1-2.  Past Medical History:  Diagnosis Date   Asthma    DM2 (diabetes mellitus, type 2) (HCC)    HFrEF (heart failure with reduced ejection fraction) (HCC)    HLD (hyperlipidemia)    HTN (hypertension)    MI (myocardial infarction) (HCC)   :   Past Surgical History:  Procedure Laterality Date   COLONOSCOPY  2021    CORONARY STENT INTERVENTION     RIGHT/LEFT HEART CATH AND CORONARY ANGIOGRAPHY N/A 07/30/2022   Procedure: RIGHT/LEFT HEART CATH AND CORONARY ANGIOGRAPHY;  Surgeon: Lennette Bihari, MD;  Location: [facility] INVASIVE CV LAB;  Service: Cardiovascular;  Laterality: N/A;  :   Current Outpatient Medications:    aspirin EC 81 MG tablet, Take 81 mg by mouth daily., Disp: , Rfl:    atorvastatin (LIPITOR) 80 MG tablet, Take 1 tablet (80 mg total) by mouth every evening., Disp: 90 tablet, Rfl: 3   budesonide-formoterol (SYMBICORT) 160-4.5 MCG/ACT inhaler, Inhale 2 puffs into the lungs 2 (two) times daily., Disp: , Rfl:    empagliflozin (JARDIANCE) 10 MG TABS tablet, Take 1 tablet (10 mg total) by mouth daily., Disp: 90 tablet, Rfl: 3   furosemide (LASIX) 40 MG tablet, Take 1 tablet (40 mg total) by mouth as needed., Disp: 30 tablet, Rfl: 11   hydrOXYzine (ATARAX) 10 MG tablet, Take 10 mg by mouth 3 (three) times daily as needed for itching., Disp: , Rfl:    metoprolol succinate (TOPROL-XL) 25 MG 24 hr tablet, Take 1 tablet (25 mg total) by mouth daily., Disp: 90 tablet, Rfl: 3   sacubitril-valsartan (ENTRESTO) 24-26 MG, Take 1 tablet by mouth 2 (two) times daily., Disp: 180 tablet, Rfl: 3   spironolactone (ALDACTONE) 25 MG  tablet, Take 0.5 tablets (12.5 mg total) by mouth daily., Disp: 45 tablet, Rfl: 3:  :  No Known Allergies:   Family History  Problem Relation Age of Onset   Kidney failure Mother    Cancer Sister    Pancreatic cancer Brother   :   Social History   Socioeconomic History   Marital status: Married    Spouse name: Olegario Messier   Number of children: 3   Years of education: Not on file   Highest education level: High school graduate  Occupational History   Occupation: Retired  Tobacco Use   Smoking status: Former    Current packs/day: 1.00    Types: Cigarettes   Smokeless tobacco: Never   Tobacco comments:    Quit smoking and drinking around [employer] Use   Vaping status:  Never Used  Substance and Sexual Activity   Alcohol use: No   Drug use: Never   Sexual activity: Not on file  Other Topics Concern   Not on file  Social History Narrative   Not on file   Social Determinants of Health   Financial Resource Strain: Low Risk  (07/27/2022)   Overall Financial Resource Strain (CARDIA)    Difficulty of Paying Living Expenses: Not hard at all  Food Insecurity: Low Risk  (08/30/2022)   [facility]   Hunger Vital Sign    Worried About Running Out of Food in the Last Year: Never true    Within the past 12 months, the food you bought just didn't last and you didn't have money to get more: Not on file  Transportation Needs: No Transportation Needs (08/30/2022)   [facility]   Transportation    In the past 12 months, has lack of reliable transportation kept you from medical appointments, meetings, work or from getting things needed for daily living? : No  Physical Activity: Unknown (06/14/2020)   [facility] prior to 10/20/2022., [facility] visits prior to 10/20/2022.   Exercise Vital Sign    Days of Exercise per Week: 3 days    Minutes of Exercise per Session: Not on file  Stress: No Stress Concern Present (06/14/2020)   [facility] prior to 10/20/2022., [facility] visits prior to 10/20/2022.   [employer] of Occupational Health - Occupational Stress Questionnaire    Feeling of Stress : Not at all  Social Connections: Unknown (06/14/2020)   [facility] visits prior to 10/20/2022., [facility] visits prior to 10/20/2022.   Social Advertising account executive [NHANES]    Frequency of Communication with Friends and Family: More than three times a week    Frequency of Social Gatherings with Friends and Family: More than  three times a week    Attends Religious Services: Not on file    Active Member of Clubs or Organizations: No    Attends Banker Meetings: Never    Marital Status: Married  Catering manager Violence: Not At Risk (07/27/2022)   Humiliation, Afraid, Rape, and Kick questionnaire    Fear of Current or Ex-Partner: No    Emotionally Abused: No    Physically Abused: No    Sexually Abused: No  :  Review of Systems  Constitutional: Negative.   HENT: Negative.    Eyes: Negative.   Respiratory: Negative.  Cardiovascular: Negative.   Gastrointestinal: Negative.   Genitourinary: Negative.   Musculoskeletal: Negative.   Skin: Negative.   Neurological: Negative.   Endo/Heme/Allergies: Negative.   Psychiatric/Behavioral: Negative.       Exam: Vital signs are temperature of 98.2.  Pulse 53.  Blood pressure 91/42.  Weight is 155 pounds.  @IPVITALS @ Physical Exam Vitals reviewed.  HENT:     Head: Normocephalic and atraumatic.  Eyes:     Pupils: Pupils are equal, round, and reactive to light.  Cardiovascular:     Rate and Rhythm: Normal rate and regular rhythm.     Heart sounds: Normal heart sounds.  Pulmonary:     Effort: Pulmonary effort is normal.     Breath sounds: Normal breath sounds.  Abdominal:     General: Bowel sounds are normal.     Palpations: Abdomen is soft.  Musculoskeletal:        General: No tenderness or deformity. Normal range of motion.     Cervical back: Normal range of motion.  Lymphadenopathy:     Cervical: No cervical adenopathy.  Skin:    General: Skin is warm and dry.     Findings: No erythema or rash.  Neurological:     Mental Status: He is alert and oriented to person, place, and time.  Psychiatric:        Behavior: Behavior normal.        Thought Content: Thought content normal.        Judgment: Judgment normal.     Recent Labs    04/26/23 1042  WBC 8.0  HGB 14.6  HCT 44.4  PLT 187    Recent Labs    04/26/23 1042  NA  139  K 4.0  CL 107  CO2 25  GLUCOSE 166*  BUN 21  CREATININE 1.64*  CALCIUM 9.3    Blood smear review: Normochromic and normocytic population of red blood cells.  There are no nucleated red blood cells.  I see no teardrop cells.  He has no rouleaux formation.  He has no target cells.  White blood cells were normal in morphology and maturation.  There is no immature myeloid or lymphoid forms.  There is no hypersegmented polys.  Platelets are adequate in number and size.  Pathology: None    Assessment and Plan: Cody Small is a very nice 77 year old African-American male.  He has congestive heart failure.  I think he questions whether or not there is light chain amyloidosis.  Of noted, and the fact that ATTR amyloidosis is a whole different disease.  This is some of that cardiologist manage did not hematologist.  We will see if he has light chain amyloidosis.  Will do a 24-hour urine on him.  We will see if this shows any monoclonal light chain.  Will see what his serum studies show.  We may need to consider a rectal biopsy or fat pad biopsy to get further confirmation or denial of light chain amyloidosis.  It was incredibly fun talking to Mr. Unis.  He is really interesting.  He is very eloquent.  He served our country.  It is a privilege to serve him.  We will plan to get him back to see [place] in about a month.  For right now, I do not see that we have to do a bone marrow biopsy on him.  This may be a test that we have to think about depending on what are blood studies and urine studies shows.

## 2023-04-28 LAB — IGG, IGA, IGM
IgA: 90 mg/dL (ref 61–437)
IgG (Immunoglobin G), Serum: 3188 mg/dL — ABNORMAL HIGH (ref 603–1613)
IgM (Immunoglobulin M), Srm: 43 mg/dL (ref 15–143)

## 2023-04-29 LAB — KAPPA/LAMBDA LIGHT CHAINS
Kappa free light chain: 105 mg/L — ABNORMAL HIGH (ref 3.3–19.4)
Kappa, lambda light chain ratio: 1.44 (ref 0.26–1.65)
Lambda free light chains: 73 mg/L — ABNORMAL HIGH (ref 5.7–26.3)

## 2023-04-29 LAB — PROTEIN ELECTROPHORESIS, SERUM, WITH REFLEX
A/G Ratio: 0.7 (ref 0.7–1.7)
Albumin ELP: 3.1 g/dL (ref 2.9–4.4)
Alpha-1-Globulin: 0.2 g/dL (ref 0.0–0.4)
Alpha-2-Globulin: 0.5 g/dL (ref 0.4–1.0)
Beta Globulin: 0.9 g/dL (ref 0.7–1.3)
Gamma Globulin: 3 g/dL — ABNORMAL HIGH (ref 0.4–1.8)
Globulin, Total: 4.7 g/dL — ABNORMAL HIGH (ref 2.2–3.9)
Total Protein ELP: 7.8 g/dL (ref 6.0–8.5)

## 2023-05-06 ENCOUNTER — Inpatient Hospital Stay: Payer: Medicare PPO

## 2023-05-06 DIAGNOSIS — N1832 Chronic kidney disease, stage 3b: Secondary | ICD-10-CM

## 2023-05-06 DIAGNOSIS — R768 Other specified abnormal immunological findings in serum: Secondary | ICD-10-CM | POA: Diagnosis not present

## 2023-05-09 ENCOUNTER — Ambulatory Visit (HOSPITAL_COMMUNITY)
Admission: RE | Admit: 2023-05-09 | Discharge: 2023-05-09 | Disposition: A | Discharge status: Home / Self Care | Payer: Medicare PPO | Source: Ambulatory Visit | Attending: Cardiology | Admitting: Cardiology

## 2023-05-09 ENCOUNTER — Encounter (HOSPITAL_COMMUNITY): Payer: Self-pay | Admitting: Cardiology

## 2023-05-09 VITALS — BP 100/50 | HR 54 | Wt 155.0 lb

## 2023-05-09 DIAGNOSIS — G629 Polyneuropathy, unspecified: Secondary | ICD-10-CM | POA: Insufficient documentation

## 2023-05-09 DIAGNOSIS — N1831 Chronic kidney disease, stage 3a: Secondary | ICD-10-CM | POA: Insufficient documentation

## 2023-05-09 DIAGNOSIS — I2510 Atherosclerotic heart disease of native coronary artery without angina pectoris: Secondary | ICD-10-CM | POA: Diagnosis present

## 2023-05-09 DIAGNOSIS — E854 Organ-limited amyloidosis: Secondary | ICD-10-CM | POA: Diagnosis not present

## 2023-05-09 DIAGNOSIS — I43 Cardiomyopathy in diseases classified elsewhere: Secondary | ICD-10-CM | POA: Insufficient documentation

## 2023-05-09 DIAGNOSIS — R9431 Abnormal electrocardiogram [ECG] [EKG]: Secondary | ICD-10-CM | POA: Insufficient documentation

## 2023-05-09 DIAGNOSIS — I255 Ischemic cardiomyopathy: Secondary | ICD-10-CM | POA: Insufficient documentation

## 2023-05-09 DIAGNOSIS — I5022 Chronic systolic (congestive) heart failure: Secondary | ICD-10-CM | POA: Insufficient documentation

## 2023-05-09 DIAGNOSIS — I130 Hypertensive heart and chronic kidney disease with heart failure and stage 1 through stage 4 chronic kidney disease, or unspecified chronic kidney disease: Secondary | ICD-10-CM | POA: Insufficient documentation

## 2023-05-09 DIAGNOSIS — I252 Old myocardial infarction: Secondary | ICD-10-CM | POA: Diagnosis present

## 2023-05-09 DIAGNOSIS — Z9861 Coronary angioplasty status: Secondary | ICD-10-CM

## 2023-05-09 NOTE — Patient Instructions (Addendum)
Medication Changes:  TAFAMADIS- MEDICATION FOR CARDIAC AMYLOID- OUR PHARMACY DEPARTMENT WILL REACH OUT TO YOU REGARDING INSTRUCTIONS FOR THIS   Lab Work:  Genetic test has been done, this has to be sent to [place] to be processed and can take 1-2 weeks to get results back.  We will let you know the results.  Referrals:  YOU HAVE BEEN REFERRED TO Dr. Allena Katz THEY WILL REACH OUT TO YOU OR CALL TO ARRANGE THIS. PLEASE CALL us WITH ANY CONCERNS   Follow-Up in: 1 month with pharmacy as scheduled, then 2 months with Dr. Gasper Lloyd as scheduled   At the [facility], you and your health needs are our priority. We have a designated team specialized in the treatment of Heart Failure. This Care Team includes your primary Heart Failure Specialized Cardiologist (physician), Advanced Practice Providers (APPs- Physician Assistants and Nurse Practitioners), and Pharmacist who all work together to provide you with the care you need, when you need it.   You may see any of the following providers on your designated Care Team at your next follow up:  Dr. Arvilla Meres Dr. Marca Ancona Dr. Marcos Eke, NP Robbie Lis, [place] [facility] [place], [place] Brynda Peon, NP Karle Plumber, PharmD   Please be sure to bring in all your medications bottles to every appointment.   Need to Contact us:  If you have any questions or concerns before your next appointment please send [place] a message through [place] or call our office at [phone].    TO LEAVE A MESSAGE FOR THE NURSE SELECT OPTION 2, PLEASE LEAVE A MESSAGE INCLUDING: YOUR NAME DATE OF BIRTH CALL BACK NUMBER REASON FOR CALL**this is important as we prioritize the call backs  YOU WILL RECEIVE A CALL BACK THE SAME DAY AS LONG AS YOU CALL BEFORE 4:00 PM

## 2023-05-09 NOTE — Progress Notes (Signed)
ADVANCED HEART FAILURE CLINIC NOTE  Referring Physician: Genevive Bi, PA*  Primary Care: Genevive Bi, PA-C   HPI: Cody Small is a 77 y.o. male with CAD (NSTEMI in 9/21 status post PCI to the circumflex), HFrEF, type 2 diabetes, hyperlipidemia, hypertension, asthma and CKD 3A.  Cody Small reports cardiac history dating back to at least 2021 when he had an NSTEMI leading to an LVEF of 45%. He had an echocardiogram in September 2022 with EF of 40 to 45% and severe inferolateral hypokinesis. He was admitted to [facility] with severe shortness of breath; BNP>3000 in December 2023. He underwent LHC/RHC with nonobstructive CAD. RHC at that time with cardiac index 2.5L/min/m2. He was diagnosed with COVID19 during his hospitalization on July 29, 2022. He had a repeat echo on 07/27/22 where LVEF dropped further to 20-25%. Since that time he was seen in [facility] clinic and statrted on GDMT.   Interval Hx: From a functional standpoint he has been doing very well.  Reports that this is the best he has ever felt.  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  We had a very lengthy discussion regarding his prognosis and importance of genetic testing.  He is motivated to start treatment now.  Activity level/exercise tolerance:  NYHAII Orthopnea:  Sleeps on 2 pillows Paroxysmal noctural dyspnea:  no Chest pain/pressure:  no Orthostatic lightheadedness:  no Palpitations:  no Lower extremity edema:  no Presyncope/syncope:  no Cough:  no  Past Medical History:  Diagnosis Date   Asthma    DM2 (diabetes mellitus, type 2) (HCC)    HFrEF (heart failure with reduced ejection fraction) (HCC)    HLD (hyperlipidemia)    HTN (hypertension)    MI (myocardial infarction) (HCC)     Current Outpatient Medications  Medication Sig Dispense Refill   aspirin EC 81 MG tablet Take 81 mg by mouth daily.     atorvastatin (LIPITOR) 80 MG tablet Take 1 tablet (80 mg total) by mouth  every evening. 90 tablet 3   budesonide-formoterol (SYMBICORT) 160-4.5 MCG/ACT inhaler Inhale 2 puffs into the lungs 2 (two) times daily.     empagliflozin (JARDIANCE) 10 MG TABS tablet Take 1 tablet (10 mg total) by mouth daily. 90 tablet 3   furosemide (LASIX) 40 MG tablet Take 1 tablet (40 mg total) by mouth as needed. 30 tablet 11   hydrOXYzine (ATARAX) 10 MG tablet Take 10 mg by mouth 3 (three) times daily as needed for itching.     metoprolol succinate (TOPROL-XL) 25 MG 24 hr tablet Take 1 tablet (25 mg total) by mouth daily. 90 tablet 3   sacubitril-valsartan (ENTRESTO) 24-26 MG Take 1 tablet by mouth 2 (two) times daily. 180 tablet 3   spironolactone (ALDACTONE) 25 MG tablet Take 0.5 tablets (12.5 mg total) by mouth daily. 45 tablet 3   No current facility-administered medications for this encounter.    No Known Allergies    Social History   Socioeconomic History   Marital status: Married    Spouse name: Cody Small   Number of children: 3   Years of education: Not on file   Highest education level: High school graduate  Occupational History   Occupation: Retired  Tobacco Use   Smoking status: Former    Current packs/day: 1.00    Types: Cigarettes   Smokeless tobacco: Never   Tobacco comments:    Quit smoking and drinking around [employer] Use   Vaping status: Never  Used  Substance and Sexual Activity   Alcohol use: No   Drug use: Never   Sexual activity: Not on file  Other Topics Concern   Not on file  Social History Narrative   Not on file   Social Determinants of Health   Financial Resource Strain: Low Risk  (07/27/2022)   Overall Financial Resource Strain (CARDIA)    Difficulty of Paying Living Expenses: Not hard at all  Food Insecurity: Low Risk  (08/30/2022)   [facility]   Hunger Vital Sign    Worried About Running Out of Food in the Last Year: Never true    Within the past 12 months, the food you bought just didn't last and  you didn't have money to get more: Not on file  Transportation Needs: No Transportation Needs (08/30/2022)   [facility]   Transportation    In the past 12 months, has lack of reliable transportation kept you from medical appointments, meetings, work or from getting things needed for daily living? : No  Physical Activity: Unknown (06/14/2020)   [facility] prior to 10/20/2022., [facility] visits prior to 10/20/2022.   Exercise Vital Sign    Days of Exercise per Week: 3 days    Minutes of Exercise per Session: Not on file  Stress: No Stress Concern Present (06/14/2020)   [facility] visits prior to 10/20/2022., [facility] visits prior to 10/20/2022.   [employer] of Occupational Health - Occupational Stress Questionnaire    Feeling of Stress : Not at all  Social Connections: Unknown (06/14/2020)   [facility] visits prior to 10/20/2022., [facility] visits prior to 10/20/2022.   Social Advertising account executive [NHANES]    Frequency of Communication with Friends and Family: More than three times a week    Frequency of Social Gatherings with Friends and Family: More than three times a week    Attends Religious Services: Not on Marketing executive or Organizations: No    Attends Banker Meetings: Never    Marital Status: Married  Catering manager Violence: Not At Risk (07/27/2022)   Humiliation, Afraid, Rape, and Kick questionnaire    Fear of Current or Ex-Partner: No    Emotionally Abused: No    Physically Abused: No    Sexually Abused: No      Family History  Problem Relation Age of Onset   Kidney failure Mother    Cancer Sister    Pancreatic cancer Brother     PHYSICAL EXAM: Vitals:   05/09/23 1043  BP: (!) 100/50  Pulse: (!) 54  SpO2: 98%    GENERAL: Well nourished, well developed, and in no apparent distress at rest.  HEENT: Negative for arcus senilis or xanthelasma. There is no scleral icterus.  The mucous membranes are pink and moist.   NECK: Supple, No masses. Normal carotid upstrokes without bruits. No masses or thyromegaly.    CHEST: There are no chest wall deformities. There is no chest wall tenderness. Respirations are unlabored.  Lungs- CTA B/L CARDIAC:  JVP: 7 cm          Normal rate with regular rhythm. No murmurs, rubs or gallops.  Pulses are 2+ and symmetrical in upper and lower extremities. No edema.  ABDOMEN: Soft, non-tender, non-distended.  There are no masses or hepatomegaly. There are normal bowel sounds.  EXTREMITIES: Warm and well perfused with no cyanosis, clubbing.  LYMPHATIC: No axillary or supraclavicular lymphadenopathy.  NEUROLOGIC: Patient is oriented x3 with no focal or lateralizing neurologic deficits.  PSYCH: Patients affect is appropriate, there is no evidence of anxiety or depression.  SKIN: Warm and dry; no lesions or wounds.      DATA REVIEW  ECG:08/21/22: NSR  ECHO: 07/27/22: LVEF 25%-30%; Grade III DD. Severe hypokinesis of the inferolateral wall. Moderately reduced RV function.   CATH: 07/30/22:    2nd Diag lesion is 50% stenosed.   Prox Cx to Mid Cx lesion is 10% stenosed.   Mild coronary artery disease with ostial 50% second diagonal stenosis in a large LAD system; patent left circumflex stent with minimal mid irregularity of 10%; and normal RCA.  No significant right heart pressure elevation with mild PA systolic elevation at 36 mm without without evidence for elevated pulmonary artery mean pressure. LVEDP 5 mmHg.   CMR: 12/20/22:  1. Normal LV size with wall motion abnormalities as noted above. LV EF 37%.  2.  Normal RV size with mild hypokinesis, EF 40%.  3. Delayed enhancement pattern suggests prior MI in the circumflex territory.  4. Extracellular volume percentage  calculates surprisingly high at 41% though absolute T1 value is not markedly high at 1066. There are no delayed enhancement findings that suggest cardiac amyloidosis and wall thickness is normal. However, witih the high ECV value, it would be worthwhile considering cardiac amyloidosis workup to rule this out.  ASSESSMENT & PLAN:  Heart failure with reduced EF Etiology of ZO:XWRUEAVW cardiomyopathy; NSTEMI in 2019. CMR with LVEF 37% however high ECV concerning for amyloid; PYP scan positive for ATTR. Will obtain genetic screening today.  NYHA class / AHA Stage:II Volume status & Diuretics: Lasix 40mg  PO prn Vasodilators: SBP remains low; will continue Entresto 24/26mg  BID.  Beta-Blocker:Toprol 25mg  XL daily; bradycardic to 60s; will hold off on further uptitration. MRA: start spironolactone 12.5mg ; repeat labs today and in 7 days.  Cardiometabolic:jardiance 10mg  Devices therapies & Valvulopathies: Not required; LVEF 37% by CMR.  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 Will refer to neurology for EMG. Plan to start silencer if positive EMG.   3. CAD - PCI to the Lcx in 2019 - Lipitor 80mg  and ASA 81mg  daily  - LHC in 12/23 with patent stent.  4. HTN - Continue entresto; recently increased to 24/26mg  BID. Tolerating it well currently.   5. CKD - repeat labs today - Jardiance 10mg  daily   Semisi Biela Advanced Heart Failure Mechanical Circulatory Support

## 2023-05-09 NOTE — Progress Notes (Signed)
Blood collected for TTR genetic testing per Dr Gasper Lloyd.  Order form completed and both shipped by [facility] to [facility].

## 2023-05-13 ENCOUNTER — Telehealth (HOSPITAL_COMMUNITY): Payer: Self-pay | Admitting: Pharmacy Technician

## 2023-05-13 ENCOUNTER — Other Ambulatory Visit (HOSPITAL_COMMUNITY): Payer: Self-pay

## 2023-05-13 NOTE — Telephone Encounter (Signed)
Advanced Heart Failure Patient Advocate Encounter  Prior Authorization for Vyndaqel has been approved.    PA# [id number] Effective dates: 08/20/22 through 08/20/23  Patients co-pay is $11.20 (30 days)  Will reach out to patient regarding specialty pharmacy referral.  Archer Asa, CPhT

## 2023-05-13 NOTE — Telephone Encounter (Signed)
Patient Advocate Encounter   Received notification from [facility] that prior authorization for Vyndaqel is required.   PA submitted on CoverMyMeds Key [id number] Status is pending   Will continue to follow.

## 2023-05-14 NOTE — Telephone Encounter (Signed)
Advanced Heart Failure Patient Advocate Encounter  Called and spoke with patient and daughter, Jasmiss. They are ok with the co-pay. Provided phone number to the specialty pharmacy and explained what to expect.  Archer Asa, CPhT

## 2023-05-15 ENCOUNTER — Encounter (HOSPITAL_COMMUNITY): Payer: Self-pay

## 2023-05-15 ENCOUNTER — Other Ambulatory Visit (HOSPITAL_COMMUNITY): Payer: Self-pay

## 2023-05-15 ENCOUNTER — Other Ambulatory Visit: Payer: Self-pay

## 2023-05-15 ENCOUNTER — Encounter (HOSPITAL_COMMUNITY): Payer: Self-pay | Admitting: Pharmacist

## 2023-05-15 ENCOUNTER — Other Ambulatory Visit (HOSPITAL_COMMUNITY): Payer: Self-pay | Admitting: Pharmacy Technician

## 2023-05-15 ENCOUNTER — Other Ambulatory Visit (HOSPITAL_COMMUNITY): Payer: Self-pay | Admitting: Pharmacist

## 2023-05-15 DIAGNOSIS — E859 Amyloidosis, unspecified: Secondary | ICD-10-CM | POA: Insufficient documentation

## 2023-05-15 MED ORDER — VYNDAQEL 20 MG PO CAPS
80.0000 mg | ORAL_CAPSULE | Freq: Every day | ORAL | 11 refills | Status: DC
  Filled 2023-05-15: qty 120, 30d supply, fill #0
  Filled 2023-06-04: qty 120, 30d supply, fill #1
  Filled 2023-07-03: qty 120, 30d supply, fill #2
  Filled 2023-08-02: qty 120, 30d supply, fill #3
  Filled 2023-08-27: qty 120, 30d supply, fill #4

## 2023-05-15 NOTE — Progress Notes (Signed)
Specialty Pharmacy Initial Fill Coordination Note  Cody Small is a 77 y.o. male contacted today regarding refills of specialty medication(s) Tafamidis Meglumine (Cardiac) .  Patient requested Delivery  on 05/16/23  to verified address [address] [place] [address]   Medication will be filled on Thursday 09/26.Marland Kitchen   Patient is aware of $11.20 copayment. Provided CC information.  Archer Asa, CPhT

## 2023-05-15 NOTE — Progress Notes (Signed)
Specialty Pharmacy Initiation Note   Cody Small is a 76 y.o. male who will be followed by the specialty pharmacy service for RxSp Cardiology. Patient counseled in encounter 05/15/23.     Review of administration, indication, effectiveness, safety, potential side effects, storage/disposable, and missed dose instructions occurred today for patient's specialty medication(s) Tafamidis Meglumine (Cardiac) .    Patient did not have any additional questions or concerns.   No data recorded   Goals      Maintain optimal adherence to therapy     Patient is initiating therapy. Patient will maintain adherence      Slow Disease Progression     Patient is initiating therapy. Patient will work toward meeting this goal by adhering to therapy.         Karle Plumber, PharmD, BCPS, BCCP, CPP Heart Failure Clinic Pharmacist [phone]

## 2023-05-16 ENCOUNTER — Other Ambulatory Visit: Payer: Self-pay

## 2023-05-22 ENCOUNTER — Telehealth (HOSPITAL_COMMUNITY): Payer: Self-pay

## 2023-05-22 NOTE — Telephone Encounter (Signed)
[facility] TTR  Collected: 05/09/23  Result POSITIVE  Patient's wife NOTIFIED (okay per DPR).   Reviewed by MD (Dr. Gasper Lloyd)   Patient scheduled to pharmD on 06/06/23

## 2023-05-27 ENCOUNTER — Other Ambulatory Visit (HOSPITAL_COMMUNITY): Payer: Self-pay | Admitting: Cardiology

## 2023-05-29 ENCOUNTER — Inpatient Hospital Stay (HOSPITAL_BASED_OUTPATIENT_CLINIC_OR_DEPARTMENT_OTHER): Payer: Medicare PPO | Admitting: Hematology & Oncology

## 2023-05-29 ENCOUNTER — Inpatient Hospital Stay: Payer: Medicare PPO | Attending: Internal Medicine

## 2023-05-29 ENCOUNTER — Encounter: Payer: Self-pay | Admitting: Hematology & Oncology

## 2023-05-29 VITALS — BP 109/53 | HR 53 | Temp 98.5°F | Resp 20 | Ht 67.0 in | Wt 154.1 lb

## 2023-05-29 DIAGNOSIS — N1832 Chronic kidney disease, stage 3b: Secondary | ICD-10-CM

## 2023-05-29 DIAGNOSIS — R768 Other specified abnormal immunological findings in serum: Secondary | ICD-10-CM | POA: Insufficient documentation

## 2023-05-29 DIAGNOSIS — E850 Non-neuropathic heredofamilial amyloidosis: Secondary | ICD-10-CM

## 2023-05-29 LAB — CBC WITH DIFFERENTIAL (CANCER CENTER ONLY)
Abs Immature Granulocytes: 0.01 10*3/uL (ref 0.00–0.07)
Basophils Absolute: 0.1 10*3/uL (ref 0.0–0.1)
Basophils Relative: 1 %
Eosinophils Absolute: 0.4 10*3/uL (ref 0.0–0.5)
Eosinophils Relative: 6 %
HCT: 43.3 % (ref 39.0–52.0)
Hemoglobin: 14.4 g/dL (ref 13.0–17.0)
Immature Granulocytes: 0 %
Lymphocytes Relative: 34 %
Lymphs Abs: 2.5 10*3/uL (ref 0.7–4.0)
MCH: 29.7 pg (ref 26.0–34.0)
MCHC: 33.3 g/dL (ref 30.0–36.0)
MCV: 89.3 fL (ref 80.0–100.0)
Monocytes Absolute: 0.5 10*3/uL (ref 0.1–1.0)
Monocytes Relative: 7 %
Neutro Abs: 3.7 10*3/uL (ref 1.7–7.7)
Neutrophils Relative %: 52 %
Platelet Count: 194 10*3/uL (ref 150–400)
RBC: 4.85 MIL/uL (ref 4.22–5.81)
RDW: 14.8 % (ref 11.5–15.5)
WBC Count: 7.3 10*3/uL (ref 4.0–10.5)
nRBC: 0 % (ref 0.0–0.2)

## 2023-05-29 LAB — CMP (CANCER CENTER ONLY)
ALT: 15 U/L (ref 0–44)
AST: 17 U/L (ref 15–41)
Albumin: 3.4 g/dL — ABNORMAL LOW (ref 3.5–5.0)
Alkaline Phosphatase: 84 U/L (ref 38–126)
Anion gap: 7 (ref 5–15)
BUN: 21 mg/dL (ref 8–23)
CO2: 25 mmol/L (ref 22–32)
Calcium: 9.2 mg/dL (ref 8.9–10.3)
Chloride: 106 mmol/L (ref 98–111)
Creatinine: 1.58 mg/dL — ABNORMAL HIGH (ref 0.61–1.24)
GFR, Estimated: 45 mL/min — ABNORMAL LOW (ref >60)
Glucose, Bld: 132 mg/dL — ABNORMAL HIGH (ref 70–99)
Potassium: 4.3 mmol/L (ref 3.5–5.1)
Sodium: 138 mmol/L (ref 135–145)
Total Bilirubin: 0.5 mg/dL (ref 0.3–1.2)
Total Protein: 8.7 g/dL — ABNORMAL HIGH (ref 6.5–8.1)

## 2023-05-29 LAB — LACTATE DEHYDROGENASE: LDH: 161 U/L (ref 98–192)

## 2023-05-29 NOTE — Progress Notes (Signed)
Hematology and Oncology Follow Up Visit  Danik Lanyon [id number] July 13, 1946 77 y.o. 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 Reznikov is back for second office visit.  He is a very nice guy.  I really enjoyed seeing him when we saw him back in September.  At that time, we evaluated him for the possibility of systemic light chain amyloidosis.  We did a 24-hour urine on him.  This was done on 05/06/2023.  This showed that there was 100 mg/L of Kappa light chain.  However, there was no monoclonality in the urine.  We did do light chain studies.  These were the serum.  This showed a kappa light chain at 10.5 mg/dL.  His lambda light chain was 7.3 mg/dL.  Again, he did have a myocardial amyloid scan.  This was done on 04/11/2023.  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  Again, I have not found anything that would suggest systemic light chain myeloma.  However, I think the final test would be a bone marrow biopsy.  I will go ahead and set him up for this.  He he feels well.  He has had no complaints.  He has had no leg swelling.  He has had no shortness of breath.  He has had no orthopnea.  He has had no issues with fever.  He has had no bleeding.  There has been no change in bowel or bladder habits.  Overall, I would say that his performance status is probably ECOG 0.  Medications:  Current Outpatient Medications:    aspirin EC 81 MG tablet, Take 81 mg by mouth daily., Disp: , Rfl:    atorvastatin (LIPITOR) 80 MG tablet, Take 1 tablet (80 mg total) by mouth every evening., Disp: 90 tablet, Rfl: 3   budesonide-formoterol (SYMBICORT) 160-4.5 MCG/ACT inhaler, Inhale 2 puffs into the lungs 2 (two) times daily., Disp: , Rfl:    empagliflozin (JARDIANCE) 10 MG TABS tablet, Take 1 tablet (10 mg total) by mouth daily., Disp: 90 tablet, Rfl: 3   furosemide (LASIX) 40 MG tablet, Take 1 tablet (40 mg  total) by mouth as needed., Disp: 30 tablet, Rfl: 11   hydrOXYzine (ATARAX) 10 MG tablet, Take 10 mg by mouth 3 (three) times daily as needed for itching., Disp: , Rfl:    metoprolol succinate (TOPROL-XL) 25 MG 24 hr tablet, Take 1 tablet (25 mg total) by mouth daily., Disp: 90 tablet, Rfl: 3   sacubitril-valsartan (ENTRESTO) 24-26 MG, Take 1 tablet by mouth 2 (two) times daily., Disp: 180 tablet, Rfl: 3   spironolactone (ALDACTONE) 25 MG tablet, Take 0.5 tablets (12.5 mg total) by mouth daily. (Patient taking differently: Take 12.5 mg by mouth 2 (two) times daily.), Disp: 45 tablet, Rfl: 3   Tafamidis Meglumine, Cardiac, (VYNDAQEL) 20 MG CAPS, Take 4 capsules (80 mg total) by mouth daily., Disp: 120 capsule, Rfl: 11  Allergies: No Known Allergies  Past Medical History, Surgical history, Social history, and Family History were reviewed and updated.  Review of Systems: Review of Systems  Constitutional: Negative.   HENT:  Negative.    Eyes: Negative.   Respiratory: Negative.    Cardiovascular: Negative.   Gastrointestinal: Negative.   Endocrine: Negative.   Genitourinary: Negative.    Musculoskeletal: Negative.   Skin: Negative.   Neurological: Negative.   Hematological: Negative.   Psychiatric/Behavioral: Negative.      Physical Exam:  height is 5\' 7"  (1.702 m) and weight is 154 lb 1.3 oz (69.9 kg). His oral temperature is 98.5 F (36.9 C). His blood pressure is 109/53 (abnormal) and his pulse is 53 (abnormal). His respiration is 20 and oxygen saturation is 100%.   Wt Readings from Last 3 Encounters:  05/29/23 154 lb 1.3 oz (69.9 kg)  05/09/23 155 lb (70.3 kg)  04/26/23 155 lb 12.8 oz (70.7 kg)    Physical Exam Vitals reviewed.  HENT:     Head: Normocephalic and atraumatic.  Eyes:     Pupils: Pupils are equal, round, and reactive to light.  Cardiovascular:     Rate and Rhythm: Normal rate and regular rhythm.     Heart sounds: Normal heart sounds.  Pulmonary:      Effort: Pulmonary effort is normal.     Breath sounds: Normal breath sounds.  Abdominal:     General: Bowel sounds are normal.     Palpations: Abdomen is soft.  Musculoskeletal:        General: No tenderness or deformity. Normal range of motion.     Cervical back: Normal range of motion.  Lymphadenopathy:     Cervical: No cervical adenopathy.  Skin:    General: Skin is warm and dry.     Findings: No erythema or rash.  Neurological:     Mental Status: He is alert and oriented to person, place, and time.  Psychiatric:        Behavior: Behavior normal.        Thought Content: Thought content normal.        Judgment: Judgment normal.      Lab Results  Component Value Date   WBC 7.3 05/29/2023   HGB 14.4 05/29/2023   HCT 43.3 05/29/2023   MCV 89.3 05/29/2023   PLT 194 05/29/2023     Chemistry      Component Value Date/Time   NA 138 05/29/2023 1200   NA 135 08/09/2022 1445   K 4.3 05/29/2023 1200   CL 106 05/29/2023 1200   CO2 25 05/29/2023 1200   BUN 21 05/29/2023 1200   BUN 48 (H) 08/09/2022 1445   CREATININE 1.58 (H) 05/29/2023 1200      Component Value Date/Time   CALCIUM 9.2 05/29/2023 1200   ALKPHOS 84 05/29/2023 1200   AST 17 05/29/2023 1200   ALT 15 05/29/2023 1200   BILITOT 0.5 05/29/2023 1200     His peripheral blood smear does not show any rouleaux formation.  I do not see any immature myeloid or lymphoid cells.  There is no nucleated red blood cells.  I see no schistocytes.  Platelets are adequate.  Impression and Plan: Mr. Pietrzyk is a very nice 77 year old African-American male.  He has some cardiac issues.  He seems to be very well compensated from my point of view.  Again, I just have a hard time believing that he has systemic light chain amyloidosis.  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  I told this is some that we do not treat.  This is a form of amyloidosis cardiology treats.  I do her know that the FDA has  recently approved a medication for this.  However, we will go ahead and do a bone marrow biopsy on him.  This probably would be the ultimate diagnostic test from my point of view.  We will go ahead and do this in about 3 weeks or so.  I  would like to see him back in about 6 weeks.   Josph Macho, MD 10/9/20241:39 PM

## 2023-05-30 LAB — KAPPA/LAMBDA LIGHT CHAINS
Kappa free light chain: 106.8 mg/L — ABNORMAL HIGH (ref 3.3–19.4)
Kappa, lambda light chain ratio: 1.22 (ref 0.26–1.65)
Lambda free light chains: 87.3 mg/L — ABNORMAL HIGH (ref 5.7–26.3)

## 2023-05-31 LAB — IGG, IGA, IGM
IgA: 90 mg/dL (ref 61–437)
IgG (Immunoglobin G), Serum: 3420 mg/dL — ABNORMAL HIGH (ref 603–1613)
IgM (Immunoglobulin M), Srm: 47 mg/dL (ref 15–143)

## 2023-06-03 LAB — PROTEIN ELECTROPHORESIS, SERUM, WITH REFLEX
A/G Ratio: 0.7 (ref 0.7–1.7)
Albumin ELP: 3.4 g/dL (ref 2.9–4.4)
Alpha-1-Globulin: 0.2 g/dL (ref 0.0–0.4)
Alpha-2-Globulin: 0.5 g/dL (ref 0.4–1.0)
Beta Globulin: 0.6 g/dL — ABNORMAL LOW (ref 0.7–1.3)
Gamma Globulin: 3.7 g/dL — ABNORMAL HIGH (ref 0.4–1.8)
Globulin, Total: 5.1 g/dL — ABNORMAL HIGH (ref 2.2–3.9)
Total Protein ELP: 8.5 g/dL (ref 6.0–8.5)

## 2023-06-04 ENCOUNTER — Other Ambulatory Visit (HOSPITAL_COMMUNITY): Payer: Self-pay | Admitting: Pharmacy Technician

## 2023-06-04 ENCOUNTER — Other Ambulatory Visit (HOSPITAL_COMMUNITY): Payer: Self-pay

## 2023-06-04 NOTE — Progress Notes (Signed)
Specialty Pharmacy Refill Coordination Note  Cody Small is a 77 y.o. male contacted today regarding refills of specialty medication(s) Tafamidis Meglumine (Cardiac)   Patient requested Delivery   Delivery date: 06/12/23   Verified address: [address]  [place]   Medication will be filled on 06/11/23.

## 2023-06-05 NOTE — Progress Notes (Incomplete)
***In Progress***    Advanced Heart Failure Clinic Note  Referring Physician: Genevive Bi, PA*  Primary Care: Genevive Bi, PA-C HF Cardiologist: Dr. Gasper Lloyd  HPI:  Cody Small is a 77 y.o. male with CAD (NSTEMI in 9/21 status post PCI to the circumflex), HFrEF, type 2 diabetes, hyperlipidemia, hypertension, asthma and CKD 3A.  Cody Small reports cardiac history dating back to at least 2021 when he had an NSTEMI leading to an LVEF of 45%. He had an echocardiogram in September 2022 with EF of 40 to 45% and severe inferolateral hypokinesis. He was admitted to [facility] with severe shortness of breath; BNP>3000 in December 2023. He underwent LHC/RHC with nonobstructive CAD. RHC at that time with cardiac index 2.5L/min/m2. He was diagnosed with COVID19 during his hospitalization on July 29, 2022. He had a repeat echo on 07/27/22 where LVEF dropped further to 20-25%. Since that time he was seen in [facility] clinic and statrted on GDMT.    Interval Hx: 05/09/23 Dr Gasper Lloyd - From a functional standpoint he has been doing very well.  Reports that this is the best he has ever felt.  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  We had a very lengthy discussion regarding his prognosis and importance of genetic testing.  He is motivated to start treatment now.   Today he returns to HF clinic for pharmacist medication titration. At last visit with MD, tafamidis meglumine 80 mg daily was started.   Overall feeling ***. Dizziness, lightheadedness, fatigue:  Chest pain or palpitations:  How is your breathing?: *** SOB: Able to complete all ADLs. Activity level ***  Weight at home pounds. Takes furosemide/torsemide/bumex *** mg *** daily.  LEE PND/Orthopnea  Appetite *** Low-salt diet:   Physical Exam Cost/affordability of meds      HF Medications: Metoprolol succinate 25 mg daily Entresto 24/26 mg BID Spironolactone 12.5 mg daily Jardiance 10 mg  daily Lasix 40 mg PRN  Has the patient been experiencing any side effects to the medications prescribed?  {YES NO:22349}  Does the patient have any problems obtaining medications due to transportation or finances?   {YES NO:22349}  Understanding of regimen: {excellent/good/fair/poor:19665} Understanding of indications: {excellent/good/fair/poor:19665} Potential of compliance: {excellent/good/fair/poor:19665} Patient understands to avoid NSAIDs. Patient understands to avoid decongestants.    Pertinent Lab Values: Serum creatinine ***, BUN ***, Potassium ***, Sodium ***, BNP ***, Magnesium ***, Digoxin ***   Vital Signs: Weight: *** (last clinic weight: 155 lbs) Blood pressure: ***  Heart rate: ***   Assessment/Plan: Heart failure with reduced EF Etiology of MV:HQIONGEX cardiomyopathy; NSTEMI in 2019. CMR with LVEF 37% however high ECV concerning for amyloid; PYP scan positive for ATTR. Will obtain genetic screening today.  NYHA class / AHA Stage:II Volume status & Diuretics: continue Lasix 40 mg PRN Vasodilators: SBP remains low; will continue Entresto 24/26 mg BID.  Beta-Blocker: metoprolol succinate 25 mg daily; bradycardic to 60s; will hold off on further uptitration. MRA: continue spironolactone 12.5 mg daily Cardiometabolic: continue Jardiance 10 mg daily Devices therapies & Valvulopathies: Not required; LVEF 37% by CMR.  Advanced therapies:N/A   2. ATTR Cardiac amyloid - Negative myeloma panel - PYP with grade III uptake - Starting application / approval process for Tafamadis today - Obtain genetic screening today - He has peripheral neuropathy in B/L upper extremities; reports mild sensory deficit at the lateral aspect of his forearms and first 3 fingers. Will refer to neurology for EMG. Plan to start silencer if positive EMG.  3. CAD - PCI to the Lcx in 2019 - Lipitor 80mg  and ASA 81mg  daily  - LHC in 12/23 with patent stent.   4. HTN - Continue entresto;  recently increased to 24/26mg  BID. Tolerating it well currently.    5. CKD - repeat labs today - Jardiance 10mg  daily  Follow up ***   Karle Plumber, PharmD, BCPS, BCCP, CPP Heart Failure Clinic Pharmacist [phone]

## 2023-06-06 ENCOUNTER — Ambulatory Visit (HOSPITAL_COMMUNITY)
Admission: RE | Admit: 2023-06-06 | Discharge: 2023-06-06 | Disposition: A | Discharge status: Home / Self Care | Payer: Medicare PPO | Source: Ambulatory Visit | Attending: Cardiology | Admitting: Cardiology

## 2023-06-06 VITALS — BP 110/58 | HR 57 | Wt 156.6 lb

## 2023-06-06 DIAGNOSIS — I5022 Chronic systolic (congestive) heart failure: Secondary | ICD-10-CM | POA: Diagnosis not present

## 2023-06-06 DIAGNOSIS — N1831 Chronic kidney disease, stage 3a: Secondary | ICD-10-CM | POA: Insufficient documentation

## 2023-06-06 DIAGNOSIS — I2510 Atherosclerotic heart disease of native coronary artery without angina pectoris: Secondary | ICD-10-CM | POA: Diagnosis not present

## 2023-06-06 DIAGNOSIS — I130 Hypertensive heart and chronic kidney disease with heart failure and stage 1 through stage 4 chronic kidney disease, or unspecified chronic kidney disease: Secondary | ICD-10-CM | POA: Insufficient documentation

## 2023-06-06 DIAGNOSIS — I252 Old myocardial infarction: Secondary | ICD-10-CM | POA: Insufficient documentation

## 2023-06-06 DIAGNOSIS — E1122 Type 2 diabetes mellitus with diabetic chronic kidney disease: Secondary | ICD-10-CM | POA: Diagnosis not present

## 2023-06-06 DIAGNOSIS — E854 Organ-limited amyloidosis: Secondary | ICD-10-CM | POA: Diagnosis not present

## 2023-06-06 DIAGNOSIS — E785 Hyperlipidemia, unspecified: Secondary | ICD-10-CM | POA: Insufficient documentation

## 2023-06-06 DIAGNOSIS — Z955 Presence of coronary angioplasty implant and graft: Secondary | ICD-10-CM | POA: Insufficient documentation

## 2023-06-06 DIAGNOSIS — I43 Cardiomyopathy in diseases classified elsewhere: Secondary | ICD-10-CM | POA: Insufficient documentation

## 2023-06-06 NOTE — Patient Instructions (Signed)
It was a pleasure seeing you today!  MEDICATIONS: -No medication changes today -Dr. Gasper Lloyd would like to start a medication called a TTR Silencer for your cardiac amyloidosis that can help prevent progression of heart failure symptoms and nerve pain (tingling/numbness in fingers/toes). It can prevent these things from getting worse.   -There are two types of TTR silencers, they are called Amvutta and [place]. We would start only one of them. Below is the links to websites so you can learn more about the medications and can help you make a decision on whether to start one of them. We can discuss more at your next visit with Dr. Gasper Lloyd, or feel free to call the clinic before then if you need more information.  [email or web address]  -Call if you have questions about your medications.  NEXT APPOINTMENT: Return to clinic in one month with Dr. Gasper Lloyd.  In general, to take care of your heart failure: -Limit your fluid intake to 2 Liters (half-gallon) per day.   -Limit your salt intake to ideally 2-3 grams (2000-3000 mg) per day. -Weigh yourself daily and record, and bring that "weight diary" to your next appointment.  (Weight gain of 2-3 pounds in 1 day typically means fluid weight.) -The medications for your heart are to help your heart and help you live longer.   -Please contact us before stopping any of your heart medications.  Call the clinic at [phone] with questions or to reschedule future appointments.

## 2023-06-06 NOTE — Progress Notes (Signed)
Advanced Heart Failure Clinic Note   Referring Physician: Genevive Bi, PA*  Primary Care: Cody Bi, PA-C HF Cardiologist: Dr. Gasper Lloyd  HPI:  Cody Small is a 77 y.o. male with CAD (NSTEMI in 04/2020 status post PCI to the circumflex), HFrEF, type 2 diabetes, hyperlipidemia, hypertension, asthma and CKD 3A. Cody Small reported cardiac history dating back to at least 2021 when he had an NSTEMI leading to an LVEF of 45%. He had an echocardiogram in September 2022 with EF of 40 to 45% and severe inferolateral hypokinesis. He was admitted to [facility] with severe shortness of breath; BNP>3000 in December 2023. He underwent LHC/RHC with nonobstructive CAD. RHC at that time with cardiac index 2.5 L/min/m2. He was diagnosed with COVID19 during this hospitalization. He had a repeat echo on 07/27/22 where LVEF dropped further to 20-25%. Since that time he was seen in [facility] clinic and started GDMT.    Recently seen by Dr Gasper Lloyd on 05/09/23. From a functional standpoint he was doing very well. Reported that was the best he had ever felt. Discussed diagnosis of cardiac amyloid. Had a discussion regarding his prognosis and importance of genetic testing. He was motivated to start treatment.  Invitae TTR 04/2023: positive for Val122Ile mutation  Today he returns to HF clinic for pharmacist medication titration. At last visit with MD, Vyndaqel 80 mg daily was started. Overall feeling pretty good. Does not monitor BP at home. In clinic, BP 110/58 mmHg. No dizziness or lightheadedness. No chest pain or palpitations. No SOB. Limits activity because of knee issues from playing football in high school. Does not monitor weight at home and has not needed any Lasix. No LEE, PND or orthopnea. Appetite is great. Experiences ongoing tingling and numbness in the first three fingers of the left hand. Also reports trigger finger and possible carpal tunnel syndrome. Denies GI symptoms (no  constipation, nausea or diarrhea).   HF Medications: Metoprolol succinate 25 mg daily Entresto 24/26 mg BID Spironolactone 12.5 mg daily Jardiance 10 mg daily Lasix 40 mg PRN  Has the patient been experiencing any side effects to the medications prescribed? No  Does the patient have any problems obtaining medications due to transportation or finances? No - [employer]  Understanding of regimen: good Understanding of indications: good Potential of compliance: good Patient understands to avoid NSAIDs. Patient understands to avoid decongestants.    Pertinent Lab Values: 05/29/23 Serum creatinine 1.58, BUN 21, Potassium 4.3, Sodium 138, BNP 196.2 (03/29/23)  Vital Signs: Weight: 156.6 lbs (last clinic weight: 155 lbs) Blood pressure: 110/58 mmHg  Heart rate: 57 bpm   Assessment/Plan: Heart failure with reduced EF Etiology of ZO:XWRUEAVW cardiomyopathy; NSTEMI in 2019. CMR with LVEF 37% however high ECV concerning for amyloid; PYP scan positive for ATTR. Invitae TTR returned positive for Val122Ile mutation. NYHA class / AHA Stage:II Volume status & Diuretics: continue Lasix 40 mg PRN Vasodilators: BP remains low; will continue Entresto 24/26 mg BID. Monitor carefully as patients with cardiac amyloidosis do not always tolerate treatment with RAAS inhibitors. No symptoms of orthostasis and is doing well currently so will continue at this time.  Beta-Blocker: continue metoprolol succinate 25 mg daily; bradycardic to 50s; will hold off on further uptitration.  MRA: continue spironolactone 12.5 mg daily Cardiometabolic: continue Jardiance 10 mg daily Devices therapies & Valvulopathies: Not required; LVEF 37% by CMR.  Advanced therapies:N/A   2. Hereditary ATTR Cardiac amyloidosis - Negative myeloma panel. Per Dr. Myna Hidalgo, they have  not found anything that would suggest systemic light chain myeloma. However, the final test would be a bone marrow biopsy. This is scheduled for 06/24/23.   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 Also notes trigger finger and possible carpal tunnel syndrome. Referred to neurology for EMG. - Continue Vyndaqel 80 mg daily  -We discussed starting treatment with a TTR silencer today for treatment of hATTR polyneuropathy. Patient was engaged with the conversation but did appear overwhelmed. States his daughter usually helps him understand medical information and help him make decisions. Provided him a patient handout as well as links to both the [employer] and [employer]. He will review these resources with his daughter and will revisit at his visit with Dr. Gasper Lloyd next month.   3. CAD - PCI to the Lcx in 2019 - LHC in 07/2022 with patent stent. - Continue atorvastatin 80 mg daily and ASA 81 mg daily    4. HTN - BP 110/58 mmHg - Continue Entresto as above   5. CKD - Continue Jardiance as above  Follow up in 4 weeks with Dr. Belinda Block, PharmD, BCPS, [facility], CPP Heart Failure Clinic Pharmacist [phone]

## 2023-06-21 ENCOUNTER — Other Ambulatory Visit (HOSPITAL_COMMUNITY): Payer: Self-pay | Admitting: Radiology

## 2023-06-21 DIAGNOSIS — E859 Amyloidosis, unspecified: Secondary | ICD-10-CM

## 2023-06-21 NOTE — Consult Note (Signed)
Chief Complaint: Patient was seen in consultation today for  CT guided bone marrow biopsy  Referring Physician(s): Ennever,Peter R  Supervising Physician: Irish Lack  Patient Status: [facility] - Out-pt  History of Present Illness: Cody Small is a 77 y.o. male ex smoker with PMH sig for asthma, DM, heart failure, HTN,HLD, CAD with prior MI/stenting and cardiomyopathy/ ATTR amyloidosis. He presents today for CT guided bone marrow biopsy for further evaluation.   Past Medical History:  Diagnosis Date   Asthma    DM2 (diabetes mellitus, type 2) (HCC)    HFrEF (heart failure with reduced ejection fraction) (HCC)    HLD (hyperlipidemia)    HTN (hypertension)    MI (myocardial infarction) [facility])     Past Surgical History:  Procedure Laterality Date   COLONOSCOPY  2021   CORONARY STENT INTERVENTION     RIGHT/LEFT HEART CATH AND CORONARY ANGIOGRAPHY N/A 07/30/2022   Procedure: RIGHT/LEFT HEART CATH AND CORONARY ANGIOGRAPHY;  Surgeon: Lennette Bihari, MD;  Location: [facility] INVASIVE CV LAB;  Service: Cardiovascular;  Laterality: N/A;    Allergies: Patient has no known allergies.  Medications: Prior to Admission medications   Medication Sig Start Date End Date Taking? Authorizing Provider  aspirin EC 81 MG tablet Take 81 mg by mouth daily.    [provider]  atorvastatin (LIPITOR) 80 MG tablet Take 1 tablet (80 mg total) by mouth every evening. 10/18/22   Sabharwal, Aditya, DO  budesonide-formoterol (SYMBICORT) 160-4.5 MCG/ACT inhaler Inhale 2 puffs into the lungs 2 (two) times daily. 02/21/22   [provider]  empagliflozin (JARDIANCE) 10 MG TABS tablet Take 1 tablet (10 mg total) by mouth daily. 10/18/22   Sabharwal, Aditya, DO  furosemide (LASIX) 40 MG tablet Take 1 tablet (40 mg total) by mouth as needed. 08/21/22   Andrey Farmer, PA-C  hydrOXYzine (ATARAX) 10 MG tablet Take 10 mg by mouth 3 (three) times daily as needed for itching. 06/22/22   [provider]  metoprolol succinate (TOPROL-XL) 25 MG 24 hr tablet Take 1 tablet (25 mg total) by mouth daily. 10/18/22   Sabharwal, Aditya, DO  sacubitril-valsartan (ENTRESTO) 24-26 MG Take 1 tablet by mouth 2 (two) times daily. 10/18/22   Sabharwal, Aditya, DO  spironolactone (ALDACTONE) 25 MG tablet Take 0.5 tablets (12.5 mg total) by mouth daily. Patient taking differently: Take 12.5 mg by mouth 2 (two) times daily. 03/29/23   Sabharwal, Aditya, DO  Tafamidis Meglumine, Cardiac, (VYNDAQEL) 20 MG CAPS Take 4 capsules (80 mg total) by mouth daily. 05/15/23   Dorthula Nettles, DO     Family History  Problem Relation Age of Onset   Kidney failure Mother    Cancer Sister    Pancreatic cancer Brother     Social History   Socioeconomic History   Marital status: Married    Spouse name: Olegario Messier   Number of children: 3   Years of education: Not on file   Highest education level: High school graduate  Occupational History   Occupation: Retired  Tobacco Use   Smoking status: Former    Current packs/day: 1.00    Types: Cigarettes   Smokeless tobacco: Never   Tobacco comments:    Quit smoking and drinking around [employer] Use   Vaping status: Never Used  Substance and Sexual Activity   Alcohol use: No   Drug use: Never   Sexual activity: Not Currently  Other Topics Concern   Not on file  Social History  Narrative   Not on file   Social Determinants of Health   Financial Resource Strain: Low Risk  (07/27/2022)   Overall Financial Resource Strain (CARDIA)    Difficulty of Paying Living Expenses: Not hard at all  Food Insecurity: Low Risk  (08/30/2022)   [facility]   Hunger Vital Sign    Worried About Running Out of Food in the Last Year: Never true    Within the past 12 months, the food you bought just didn't last and you didn't have money to get more: Not on file  Transportation Needs: No Transportation Needs (08/30/2022)   [facility]   Transportation    In the past 12 months, has lack of reliable transportation kept you from medical appointments, meetings, work or from getting things needed for daily living? : No  Physical Activity: Unknown (06/14/2020)   [facility] prior to 10/20/2022., [facility] visits prior to 10/20/2022.   Exercise Vital Sign    Days of Exercise per Week: 3 days    Minutes of Exercise per Session: Not on file  Stress: No Stress Concern Present (06/14/2020)   [facility] prior to 10/20/2022., [facility] visits prior to 10/20/2022.   [employer] of Occupational Health - Occupational Stress Questionnaire    Feeling of Stress : Not at all  Social Connections: Unknown (06/14/2020)   [facility] prior to 10/20/2022., [facility] visits prior to 10/20/2022.   Social Advertising account executive [NHANES]    Frequency of Communication with Friends and Family: More than three times a week    Frequency of Social Gatherings with Friends and Family: More than three times a week    Attends Religious Services: Not on Marketing executive or Organizations: No    Attends Banker Meetings: Never    Marital Status: Married      Review of Systems  Vital Signs:   Code Status:   Advance Care Plan: no documents on file    Physical Exam  Imaging: No results found.  Labs:  CBC: Recent Labs    07/31/22 0140 09/18/22 1139 04/26/23 1042 05/29/23 1200  WBC 8.5 7.3 8.0 7.3  HGB 12.7* 12.4* 14.6 14.4  HCT 36.2* 36.8* 44.4 43.3  PLT 212 244 187 194    COAGS: No results for input(s): "INR", "APTT" in the last 8760 hours.  BMP: Recent Labs    03/29/23 1104 04/08/23 0952 04/26/23 1042 05/29/23 1200  NA 137 138 139 138  K 4.4 4.1 4.0 4.3  CL 107 109 107 106   CO2 25 22 25 25   GLUCOSE 121* 125* 166* 132*  BUN 21 23 21 21   CALCIUM 9.6 9.0 9.3 9.2  CREATININE 1.64* 1.63* 1.64* 1.58*  GFRNONAA 43* 44* 43* 45*    LIVER FUNCTION TESTS: Recent Labs    08/21/22 1212 04/26/23 1042 05/29/23 1200  BILITOT 0.5 0.6 0.5  AST 33 19 17  ALT 37 17 15  ALKPHOS 89 90 84  PROT 9.7* 8.5* 8.7*  ALBUMIN 3.1* 3.6 3.4*    TUMOR MARKERS: No results for input(s): "AFPTM", "CEA", "CA199", "CHROMGRNA" in the last 8760 hours.  Assessment and Plan: 77 y.o. male ex smoker with PMH sig for asthma, DM, heart failure, HTN,HLD, CAD with  prior MI/stenting and cardiomyopathy/ ATTR amyloidosis. He presents today for CT guided bone marrow biopsy for further evaluation. Risks and benefits of procedure was discussed with the patient including, but not limited to bleeding, infection, damage to adjacent structures or low yield requiring additional tests.  All of the questions were answered and there is agreement to proceed.  Consent signed and in chart.    Thank you for this interesting consult.  I greatly enjoyed meeting Laymond Postle and look forward to participating in their care.  A copy of this report was sent to the requesting provider on this date.  Electronically Signed: D. Jeananne Rama, PA-C 06/21/2023, 6:29 PM   I spent a total of  20 minutes   in face to face in clinical consultation, greater than 50% of which was counseling/coordinating care for CT guided bone marrow biopsy

## 2023-06-24 ENCOUNTER — Ambulatory Visit (HOSPITAL_COMMUNITY)
Admission: RE | Admit: 2023-06-24 | Discharge: 2023-06-24 | Disposition: A | Discharge status: Home / Self Care | Payer: Medicare PPO | Source: Ambulatory Visit | Attending: Hematology & Oncology | Admitting: Hematology & Oncology

## 2023-06-24 ENCOUNTER — Encounter (HOSPITAL_COMMUNITY): Payer: Self-pay

## 2023-06-24 DIAGNOSIS — I5022 Chronic systolic (congestive) heart failure: Secondary | ICD-10-CM | POA: Insufficient documentation

## 2023-06-24 DIAGNOSIS — I2510 Atherosclerotic heart disease of native coronary artery without angina pectoris: Secondary | ICD-10-CM | POA: Diagnosis not present

## 2023-06-24 DIAGNOSIS — I110 Hypertensive heart disease with heart failure: Secondary | ICD-10-CM | POA: Insufficient documentation

## 2023-06-24 DIAGNOSIS — Z87891 Personal history of nicotine dependence: Secondary | ICD-10-CM | POA: Diagnosis not present

## 2023-06-24 DIAGNOSIS — Z79899 Other long term (current) drug therapy: Secondary | ICD-10-CM | POA: Insufficient documentation

## 2023-06-24 DIAGNOSIS — I252 Old myocardial infarction: Secondary | ICD-10-CM | POA: Diagnosis not present

## 2023-06-24 DIAGNOSIS — E785 Hyperlipidemia, unspecified: Secondary | ICD-10-CM | POA: Insufficient documentation

## 2023-06-24 DIAGNOSIS — E854 Organ-limited amyloidosis: Secondary | ICD-10-CM | POA: Diagnosis not present

## 2023-06-24 DIAGNOSIS — E859 Amyloidosis, unspecified: Secondary | ICD-10-CM

## 2023-06-24 DIAGNOSIS — E119 Type 2 diabetes mellitus without complications: Secondary | ICD-10-CM | POA: Diagnosis not present

## 2023-06-24 DIAGNOSIS — N1832 Chronic kidney disease, stage 3b: Secondary | ICD-10-CM | POA: Diagnosis not present

## 2023-06-24 DIAGNOSIS — J45909 Unspecified asthma, uncomplicated: Secondary | ICD-10-CM | POA: Insufficient documentation

## 2023-06-24 DIAGNOSIS — Z9861 Coronary angioplasty status: Secondary | ICD-10-CM | POA: Diagnosis not present

## 2023-06-24 DIAGNOSIS — E850 Non-neuropathic heredofamilial amyloidosis: Secondary | ICD-10-CM | POA: Insufficient documentation

## 2023-06-24 DIAGNOSIS — Z955 Presence of coronary angioplasty implant and graft: Secondary | ICD-10-CM | POA: Diagnosis not present

## 2023-06-24 DIAGNOSIS — I429 Cardiomyopathy, unspecified: Secondary | ICD-10-CM | POA: Diagnosis not present

## 2023-06-24 DIAGNOSIS — Z1379 Encounter for other screening for genetic and chromosomal anomalies: Secondary | ICD-10-CM | POA: Diagnosis not present

## 2023-06-24 DIAGNOSIS — I43 Cardiomyopathy in diseases classified elsewhere: Secondary | ICD-10-CM | POA: Diagnosis not present

## 2023-06-24 LAB — GLUCOSE, CAPILLARY: Glucose-Capillary: 87 mg/dL (ref 70–99)

## 2023-06-24 LAB — CBC WITH DIFFERENTIAL/PLATELET
Abs Immature Granulocytes: 0.02 10*3/uL (ref 0.00–0.07)
Basophils Absolute: 0.1 10*3/uL (ref 0.0–0.1)
Basophils Relative: 1 %
Eosinophils Absolute: 0.4 10*3/uL (ref 0.0–0.5)
Eosinophils Relative: 6 %
HCT: 42 % (ref 39.0–52.0)
Hemoglobin: 13.8 g/dL (ref 13.0–17.0)
Immature Granulocytes: 0 %
Lymphocytes Relative: 37 %
Lymphs Abs: 2.7 10*3/uL (ref 0.7–4.0)
MCH: 29.5 pg (ref 26.0–34.0)
MCHC: 32.9 g/dL (ref 30.0–36.0)
MCV: 89.7 fL (ref 80.0–100.0)
Monocytes Absolute: 0.7 10*3/uL (ref 0.1–1.0)
Monocytes Relative: 9 %
Neutro Abs: 3.6 10*3/uL (ref 1.7–7.7)
Neutrophils Relative %: 47 %
Platelets: 198 10*3/uL (ref 150–400)
RBC: 4.68 MIL/uL (ref 4.22–5.81)
RDW: 15.7 % — ABNORMAL HIGH (ref 11.5–15.5)
WBC: 7.5 10*3/uL (ref 4.0–10.5)
nRBC: 0 % (ref 0.0–0.2)

## 2023-06-24 MED ORDER — FENTANYL CITRATE (PF) 100 MCG/2ML IJ SOLN
INTRAMUSCULAR | Status: AC | PRN
  Administered 2023-06-24 (×2): 50 ug via INTRAVENOUS

## 2023-06-24 MED ORDER — MIDAZOLAM HCL 2 MG/2ML IJ SOLN
INTRAMUSCULAR | Status: AC | PRN
  Administered 2023-06-24 (×2): 1 mg via INTRAVENOUS

## 2023-06-24 MED ORDER — SODIUM CHLORIDE 0.9 % IV SOLN: INTRAVENOUS | Status: DC

## 2023-06-24 MED ORDER — NALOXONE HCL 0.4 MG/ML IJ SOLN
INTRAMUSCULAR | Status: AC
Start: 2023-06-24 — End: ?
  Filled 2023-06-24: qty 1

## 2023-06-24 MED ORDER — FENTANYL CITRATE (PF) 100 MCG/2ML IJ SOLN
INTRAMUSCULAR | Status: AC
  Filled 2023-06-24: qty 2

## 2023-06-24 MED ORDER — SODIUM CHLORIDE 0.9% FLUSH: 3.0000 mL | Freq: Two times a day (BID) | INTRAVENOUS | Status: DC

## 2023-06-24 MED ORDER — FLUMAZENIL 0.5 MG/5ML IV SOLN
INTRAVENOUS | Status: AC
  Filled 2023-06-24: qty 5

## 2023-06-24 MED ORDER — MIDAZOLAM HCL 2 MG/2ML IJ SOLN
INTRAMUSCULAR | Status: AC
  Filled 2023-06-24: qty 2

## 2023-06-24 NOTE — Procedures (Signed)
Interventional Radiology Procedure Note  Procedure: CT guided bone marrow aspiration and biopsy  Complications: None  EBL: < 10 mL  Findings: Aspirate and core biopsy performed of bone marrow in right iliac bone.  Plan: Bedrest supine x 1 hrs  Lianni Kanaan T. San Lohmeyer, M.D Pager:  [phone]   

## 2023-06-26 LAB — SURGICAL PATHOLOGY

## 2023-07-02 ENCOUNTER — Encounter (HOSPITAL_COMMUNITY): Payer: Self-pay | Admitting: Cardiology

## 2023-07-02 ENCOUNTER — Ambulatory Visit (HOSPITAL_COMMUNITY)
Admission: RE | Admit: 2023-07-02 | Discharge: 2023-07-02 | Disposition: A | Discharge status: Home / Self Care | Payer: Medicare PPO | Source: Ambulatory Visit | Attending: Cardiology | Admitting: Cardiology

## 2023-07-02 VITALS — BP 110/60 | HR 53 | Wt 157.0 lb

## 2023-07-02 DIAGNOSIS — I5022 Chronic systolic (congestive) heart failure: Secondary | ICD-10-CM | POA: Diagnosis not present

## 2023-07-02 DIAGNOSIS — E1122 Type 2 diabetes mellitus with diabetic chronic kidney disease: Secondary | ICD-10-CM | POA: Diagnosis not present

## 2023-07-02 DIAGNOSIS — J45909 Unspecified asthma, uncomplicated: Secondary | ICD-10-CM | POA: Diagnosis not present

## 2023-07-02 DIAGNOSIS — Z7982 Long term (current) use of aspirin: Secondary | ICD-10-CM | POA: Insufficient documentation

## 2023-07-02 DIAGNOSIS — N1832 Chronic kidney disease, stage 3b: Secondary | ICD-10-CM

## 2023-07-02 DIAGNOSIS — I130 Hypertensive heart and chronic kidney disease with heart failure and stage 1 through stage 4 chronic kidney disease, or unspecified chronic kidney disease: Secondary | ICD-10-CM | POA: Diagnosis not present

## 2023-07-02 DIAGNOSIS — N1831 Chronic kidney disease, stage 3a: Secondary | ICD-10-CM | POA: Insufficient documentation

## 2023-07-02 DIAGNOSIS — Z8616 Personal history of COVID-19: Secondary | ICD-10-CM | POA: Insufficient documentation

## 2023-07-02 DIAGNOSIS — Z9861 Coronary angioplasty status: Secondary | ICD-10-CM

## 2023-07-02 DIAGNOSIS — E1142 Type 2 diabetes mellitus with diabetic polyneuropathy: Secondary | ICD-10-CM | POA: Diagnosis not present

## 2023-07-02 DIAGNOSIS — I252 Old myocardial infarction: Secondary | ICD-10-CM | POA: Diagnosis not present

## 2023-07-02 DIAGNOSIS — I2510 Atherosclerotic heart disease of native coronary artery without angina pectoris: Secondary | ICD-10-CM | POA: Diagnosis not present

## 2023-07-02 DIAGNOSIS — E785 Hyperlipidemia, unspecified: Secondary | ICD-10-CM | POA: Diagnosis not present

## 2023-07-02 DIAGNOSIS — I255 Ischemic cardiomyopathy: Secondary | ICD-10-CM | POA: Insufficient documentation

## 2023-07-02 DIAGNOSIS — E854 Organ-limited amyloidosis: Secondary | ICD-10-CM | POA: Insufficient documentation

## 2023-07-02 DIAGNOSIS — I43 Cardiomyopathy in diseases classified elsewhere: Secondary | ICD-10-CM | POA: Insufficient documentation

## 2023-07-02 DIAGNOSIS — Z955 Presence of coronary angioplasty implant and graft: Secondary | ICD-10-CM | POA: Insufficient documentation

## 2023-07-02 DIAGNOSIS — Z79899 Other long term (current) drug therapy: Secondary | ICD-10-CM | POA: Diagnosis not present

## 2023-07-02 DIAGNOSIS — Z7984 Long term (current) use of oral hypoglycemic drugs: Secondary | ICD-10-CM | POA: Diagnosis not present

## 2023-07-02 LAB — BASIC METABOLIC PANEL
Anion gap: 8 (ref 5–15)
BUN: 18 mg/dL (ref 8–23)
CO2: 21 mmol/L — ABNORMAL LOW (ref 22–32)
Calcium: 9.4 mg/dL (ref 8.9–10.3)
Chloride: 109 mmol/L (ref 98–111)
Creatinine, Ser: 1.73 mg/dL — ABNORMAL HIGH (ref 0.61–1.24)
GFR, Estimated: 40 mL/min — ABNORMAL LOW (ref >60)
Glucose, Bld: 96 mg/dL (ref 70–99)
Potassium: 4 mmol/L (ref 3.5–5.1)
Sodium: 138 mmol/L (ref 135–145)

## 2023-07-02 LAB — BRAIN NATRIURETIC PEPTIDE: B Natriuretic Peptide: 378.1 pg/mL — ABNORMAL HIGH (ref 0.0–100.0)

## 2023-07-02 MED ORDER — METOPROLOL SUCCINATE ER 25 MG PO TB24: 12.5000 mg | ORAL_TABLET | Freq: Every day | ORAL | 3 refills | Status: AC

## 2023-07-02 NOTE — Progress Notes (Signed)
ADVANCED HEART FAILURE CLINIC NOTE  Referring Physician: Genevive Bi, PA*  Primary Care: Genevive Bi, PA-C   HPI: Cody Small is a 77 y.o. male with CAD (NSTEMI in 9/21 status post PCI to the circumflex), HFrEF, type 2 diabetes, hyperlipidemia, hypertension, asthma and CKD 3A.  Cody Small reports cardiac history dating back to at least 2021 when he had an NSTEMI leading to an LVEF of 45%. He had an echocardiogram in September 2022 with EF of 40 to 45% and severe inferolateral hypokinesis. He was admitted to [facility] with severe shortness of breath; BNP>3000 in December 2023. He underwent LHC/RHC with nonobstructive CAD. RHC at that time with cardiac index 2.5L/min/m2. He was diagnosed with COVID19 during his hospitalization on July 29, 2022. He had a repeat echo on 07/27/22 where LVEF dropped further to 20-25%. Since that time he was seen in [facility] clinic and statrted on GDMT.   Interval Hx: Continues to feel very well. No dyspnea, lower extremity edema, chest pain or dizziness.   Activity level/exercise tolerance:  NYHAII Orthopnea:  Sleeps on 2 pillows Paroxysmal noctural dyspnea:  no Chest pain/pressure:  no Orthostatic lightheadedness:  no Palpitations:  no Lower extremity edema:  no Presyncope/syncope:  no Cough:  no  Past Medical History:  Diagnosis Date   Asthma    DM2 (diabetes mellitus, type 2) (HCC)    HFrEF (heart failure with reduced ejection fraction) (HCC)    HLD (hyperlipidemia)    HTN (hypertension)    MI (myocardial infarction) (HCC)     Current Outpatient Medications  Medication Sig Dispense Refill   aspirin EC 81 MG tablet Take 81 mg by mouth daily.     atorvastatin (LIPITOR) 80 MG tablet Take 1 tablet (80 mg total) by mouth every evening. 90 tablet 3   budesonide-formoterol (SYMBICORT) 160-4.5 MCG/ACT inhaler Inhale 2 puffs into the lungs 2 (two) times daily.     empagliflozin (JARDIANCE) 10 MG TABS tablet Take 1 tablet (10 mg  total) by mouth daily. 90 tablet 3   furosemide (LASIX) 40 MG tablet Take 1 tablet (40 mg total) by mouth as needed. 30 tablet 11   hydrOXYzine (ATARAX) 10 MG tablet Take 10 mg by mouth 3 (three) times daily as needed for itching.     sacubitril-valsartan (ENTRESTO) 24-26 MG Take 1 tablet by mouth 2 (two) times daily. 180 tablet 3   spironolactone (ALDACTONE) 25 MG tablet Take 0.5 tablets (12.5 mg total) by mouth daily. 45 tablet 3   Tafamidis Meglumine, Cardiac, (VYNDAQEL) 20 MG CAPS Take 4 capsules (80 mg total) by mouth daily. 120 capsule 11   metoprolol succinate (TOPROL-XL) 25 MG 24 hr tablet Take 0.5 tablets (12.5 mg total) by mouth daily. 45 tablet 3   No current facility-administered medications for this encounter.    No Known Allergies    Social History   Socioeconomic History   Marital status: Married    Spouse name: Olegario Messier   Number of children: 3   Years of education: Not on file   Highest education level: High school graduate  Occupational History   Occupation: Retired  Tobacco Use   Smoking status: Former    Current packs/day: 1.00    Types: Cigarettes   Smokeless tobacco: Never   Tobacco comments:    Quit smoking and drinking around [employer] Use   Vaping status: Never Used  Substance and Sexual Activity   Alcohol use: No   Drug use: Never  Sexual activity: Not Currently  Other Topics Concern   Not on file  Social History Narrative   Not on file   Social Determinants of Health   Financial Resource Strain: Low Risk  (07/27/2022)   Overall Financial Resource Strain (CARDIA)    Difficulty of Paying Living Expenses: Not hard at all  Food Insecurity: Low Risk  (08/30/2022)   [facility]   Hunger Vital Sign    Worried About Running Out of Food in the Last Year: Never true    Within the past 12 months, the food you bought just didn't last and you didn't have money to get more: Not on file  Transportation Needs: No  Transportation Needs (08/30/2022)   [facility]   Transportation    In the past 12 months, has lack of reliable transportation kept you from medical appointments, meetings, work or from getting things needed for daily living? : No  Physical Activity: Unknown (06/14/2020)   [facility] prior to 10/20/2022., [facility] visits prior to 10/20/2022.   Exercise Vital Sign    Days of Exercise per Week: 3 days    Minutes of Exercise per Session: Not on file  Stress: No Stress Concern Present (06/14/2020)   [facility] visits prior to 10/20/2022., [facility] visits prior to 10/20/2022.   [employer] of Occupational Health - Occupational Stress Questionnaire    Feeling of Stress : Not at all  Social Connections: Unknown (06/14/2020)   [facility] visits prior to 10/20/2022., [facility] visits prior to 10/20/2022.   Social Advertising account executive [NHANES]    Frequency of Communication with Friends and Family: More than three times a week    Frequency of Social Gatherings with Friends and Family: More than three times a week    Attends Religious Services: Not on Marketing executive or Organizations: No    Attends Banker Meetings: Never    Marital Status: Married  Catering manager Violence: Not At Risk (07/27/2022)   Humiliation, Afraid, Rape, and Kick questionnaire    Fear of Current or Ex-Partner: No    Emotionally Abused: No    Physically Abused: No    Sexually Abused: No      Family History  Problem Relation Age of Onset   Kidney failure Mother    Cancer Sister    Pancreatic cancer Brother     PHYSICAL EXAM: Vitals:   07/02/23 1023  BP: 110/60  Pulse: (!) 53  SpO2: 99%   GENERAL: Well nourished, well developed, and in no apparent distress at  rest.  HEENT: Negative for arcus senilis or xanthelasma. There is no scleral icterus.  The mucous membranes are pink and moist.   NECK: Supple, No masses. Normal carotid upstrokes without bruits. No masses or thyromegaly.    CHEST: There are no chest wall deformities. There is no chest wall tenderness. Respirations are unlabored.  Lungs- CTA B/L CARDIAC:  JVP: 7 cm          Normal rate with regular rhythm. No murmurs, rubs or gallops.  Pulses are 2+ and symmetrical in upper and lower extremities. No edema.  ABDOMEN: Soft, non-tender, non-distended. There are no masses or hepatomegaly. There are normal bowel sounds.  EXTREMITIES: Warm and well perfused with no cyanosis,  clubbing.  LYMPHATIC: No axillary or supraclavicular lymphadenopathy.  NEUROLOGIC: Patient is oriented x3 with no focal or lateralizing neurologic deficits.  PSYCH: Patients affect is appropriate, there is no evidence of anxiety or depression.  SKIN: Warm and dry; no lesions or wounds.       DATA REVIEW  ECG:08/21/22: NSR  ECHO: 07/27/22: LVEF 25%-30%; Grade III DD. Severe hypokinesis of the inferolateral wall. Moderately reduced RV function.   CATH: 07/30/22:    2nd Diag lesion is 50% stenosed.   Prox Cx to Mid Cx lesion is 10% stenosed.   Mild coronary artery disease with ostial 50% second diagonal stenosis in a large LAD system; patent left circumflex stent with minimal mid irregularity of 10%; and normal RCA.  No significant right heart pressure elevation with mild PA systolic elevation at 36 mm without without evidence for elevated pulmonary artery mean pressure. LVEDP 5 mmHg.   CMR: 12/20/22:  1. Normal LV size with wall motion abnormalities as noted above. LV EF 37%.  2.  Normal RV size with mild hypokinesis, EF 40%.  3. Delayed enhancement pattern suggests prior MI in the circumflex territory.  4. Extracellular volume percentage calculates surprisingly high at 41% though absolute T1 value is not markedly  high at 1066. There are no delayed enhancement findings that suggest cardiac amyloidosis and wall thickness is normal. However, witih the high ECV value, it would be worthwhile considering cardiac amyloidosis workup to rule this out.  ASSESSMENT & PLAN:  Heart failure with reduced EF Etiology of UX:[id number] cardiomyopathy; NSTEMI in 2019. CMR with LVEF 37% however high ECV concerning for amyloid; PYP scan positive for ATTR. Will obtain genetic screening today.  NYHA class / AHA Stage:II Volume status & Diuretics: Lasix 40mg  PO prn Vasodilators: SBP remains low; will continue Entresto 24/26mg  BID.  Beta-Blocker:Toprol 25mg  XL daily;bradycardic to 50s; will decrease to 12.5mg  daily.  MRA: start spironolactone 12.5mg ; repeat labs today and in 7 days.  Cardiometabolic:jardiance 10mg  Devices therapies & Valvulopathies: Not required; LVEF 37% by CMR.  Advanced therapies:N/A  2. 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  - Now on Vyndaqel 80mg  daily.  - discussed screening for family members; will reach out to company for this.  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 Will plan to start silencer in the future; he is overwhelmed with costs at the moment.   3. CAD - PCI to the Lcx in 2019 - Lipitor 80mg  and ASA 81mg  daily  - LHC in 12/23 with patent stent. - no chest pain, SOB. Continue high intensity statin.   4. HTN - Continue entresto; recently increased to 24/26mg  BID. Tolerating it well currently.  - repeat labs today; limited by SBP.   5. CKD - repeat BMP - Jardiance 10mg  daily   Zayveon Raschke Advanced Heart Failure Mechanical Circulatory Support

## 2023-07-02 NOTE — Patient Instructions (Signed)
Medication Changes:  DECREASE METOPROLOL SUCCINATE TO 12.5MG  DAILY   Lab Work:  Labs done today, your results will be available in MyChart, we will contact you for abnormal readings.  Follow-Up in: 3 MONTHS PLEASE CALL OUR OFFICE AROUND DECEMBER TO GET SCHEDULED FOR YOUR APPOINTMENT. PHONE NUMBER IS [phone] OPTION 2   At the [facility], you and your health needs are our priority. We have a designated team specialized in the treatment of Heart Failure. This Care Team includes your primary Heart Failure Specialized Cardiologist (physician), Advanced Practice Providers (APPs- Physician Assistants and Nurse Practitioners), and Pharmacist who all work together to provide you with the care you need, when you need it.   You may see any of the following providers on your designated Care Team at your next follow up:  Dr. Arvilla Meres Dr. Marca Ancona Dr. Dorthula Nettles Dr. Theresia Bough Tonye Becket, NP Robbie Lis, [place] [facility], [place] Brynda Peon, NP [place] Lee, NP Karle Plumber, PharmD   Please be sure to bring in all your medications bottles to every appointment.   Need to Contact us:  If you have any questions or concerns before your next appointment please send [place] a message through [place] or call our office at [phone].    TO LEAVE A MESSAGE FOR THE NURSE SELECT OPTION 2, PLEASE LEAVE A MESSAGE INCLUDING: YOUR NAME DATE OF BIRTH CALL BACK NUMBER REASON FOR CALL**this is important as we prioritize the call backs  YOU WILL RECEIVE A CALL BACK THE SAME DAY AS LONG AS YOU CALL BEFORE 4:00 PM

## 2023-07-03 ENCOUNTER — Other Ambulatory Visit: Payer: Self-pay

## 2023-07-03 ENCOUNTER — Telehealth (HOSPITAL_COMMUNITY): Payer: Self-pay

## 2023-07-03 NOTE — Progress Notes (Signed)
Specialty Pharmacy Refill Coordination Note  Cody Small is a 77 y.o. male contacted today regarding refills of specialty medication(s) Tafamidis Meglumine (Cardiac)   Patient requested Delivery   Delivery date: 07/08/23   Verified address: [address] [place] [address]   Medication will be filled on 07/05/23.

## 2023-07-03 NOTE — Telephone Encounter (Signed)
Family returned your call, please advise

## 2023-07-05 ENCOUNTER — Encounter (HOSPITAL_COMMUNITY): Payer: Self-pay

## 2023-07-05 ENCOUNTER — Other Ambulatory Visit: Payer: Self-pay

## 2023-07-25 ENCOUNTER — Other Ambulatory Visit: Payer: Self-pay

## 2023-07-31 ENCOUNTER — Telehealth (HOSPITAL_COMMUNITY): Payer: Self-pay | Admitting: Pharmacy Technician

## 2023-07-31 ENCOUNTER — Telehealth: Payer: Self-pay | Admitting: *Deleted

## 2023-07-31 DIAGNOSIS — Z9861 Coronary angioplasty status: Secondary | ICD-10-CM | POA: Diagnosis not present

## 2023-07-31 DIAGNOSIS — I2510 Atherosclerotic heart disease of native coronary artery without angina pectoris: Secondary | ICD-10-CM | POA: Diagnosis not present

## 2023-07-31 DIAGNOSIS — I5089 Other heart failure: Secondary | ICD-10-CM | POA: Diagnosis not present

## 2023-07-31 DIAGNOSIS — E782 Mixed hyperlipidemia: Secondary | ICD-10-CM | POA: Diagnosis not present

## 2023-07-31 DIAGNOSIS — E1121 Type 2 diabetes mellitus with diabetic nephropathy: Secondary | ICD-10-CM | POA: Diagnosis not present

## 2023-07-31 DIAGNOSIS — J4530 Mild persistent asthma, uncomplicated: Secondary | ICD-10-CM | POA: Diagnosis not present

## 2023-07-31 NOTE — Telephone Encounter (Signed)
Spoke with patient and he has decided that he did not need a follow up appointment at this time. He said that he would call back if he needed to.

## 2023-07-31 NOTE — Telephone Encounter (Signed)
Patient Advocate Encounter   Received notification from [facility] that prior authorization for Vyndamax is required.   PA submitted on CoverMyMeds Key [id number] Status is pending   Will continue to follow.

## 2023-07-31 NOTE — Telephone Encounter (Signed)
Advanced Heart Failure Patient Advocate Encounter  Prior Authorization for Cody Small has been approved.    PA# [id number] Effective dates: 08/20/22 through 08/19/24  Will contact patient to confirm how much Vyndaqel he has left, then will switch over to filling Vyndamax with our specialty pharmacy.

## 2023-08-02 ENCOUNTER — Other Ambulatory Visit: Payer: Self-pay

## 2023-08-02 NOTE — Progress Notes (Signed)
Specialty Pharmacy Refill Coordination Note  Cody Small is a 77 y.o. male contacted today regarding refills of specialty medication(s) Tafamidis Meglumine (Cardiac) Cody Small)   Patient requested Delivery   Delivery date: 08/06/23   Verified address: [address] [place] [address]   Medication will be filled on 08/05/23.

## 2023-08-05 ENCOUNTER — Other Ambulatory Visit: Payer: Self-pay

## 2023-08-23 ENCOUNTER — Other Ambulatory Visit (HOSPITAL_COMMUNITY): Payer: Self-pay

## 2023-08-26 ENCOUNTER — Other Ambulatory Visit: Payer: Self-pay

## 2023-08-27 ENCOUNTER — Other Ambulatory Visit: Payer: Self-pay

## 2023-08-27 NOTE — Progress Notes (Signed)
 Specialty Pharmacy Refill Coordination Note  Cody Small is a 78 y.o. male contacted today regarding refills of specialty medication(s) Tafamidis  Meglumine  (Cardiac) (Vyndaqel )   Patient requested Delivery   Delivery date: 09/09/23   Verified address: [address] [place] [address]   Medication will be filled on 09/06/23.

## 2023-09-06 ENCOUNTER — Other Ambulatory Visit: Payer: Self-pay

## 2023-09-10 NOTE — Telephone Encounter (Signed)
Advanced Heart Failure Patient Advocate Encounter  Spoke with patient. He just received a Vyndaqel shipment. Agreed to wait until that ran out to switch back to [place]. Will work with the specialty pharmacy to put in place for next refill.

## 2023-09-10 NOTE — Telephone Encounter (Signed)
Advanced Heart Failure Patient Advocate Encounter  Called and left the patient a message.

## 2023-09-13 ENCOUNTER — Other Ambulatory Visit: Payer: Self-pay

## 2023-09-13 ENCOUNTER — Other Ambulatory Visit (HOSPITAL_COMMUNITY): Payer: Self-pay

## 2023-09-23 ENCOUNTER — Other Ambulatory Visit: Payer: Self-pay

## 2023-09-23 ENCOUNTER — Other Ambulatory Visit (HOSPITAL_COMMUNITY): Payer: Self-pay | Admitting: Pharmacist

## 2023-09-23 ENCOUNTER — Other Ambulatory Visit (HOSPITAL_COMMUNITY): Payer: Self-pay

## 2023-09-23 MED ORDER — VYNDAMAX 61 MG PO CAPS
1.0000 | ORAL_CAPSULE | Freq: Every day | ORAL | 11 refills | Status: DC
  Filled 2023-09-24: qty 30, 30d supply, fill #0
  Filled 2023-11-06: qty 30, 30d supply, fill #1
  Filled 2023-12-02: qty 30, 30d supply, fill #2
  Filled 2024-01-01: qty 30, 30d supply, fill #3
  Filled 2024-02-03: qty 30, 30d supply, fill #4
  Filled 2024-03-02: qty 30, 30d supply, fill #5
  Filled 2024-03-27: qty 30, 30d supply, fill #6
  Filled 2024-05-01: qty 30, 30d supply, fill #7
  Filled 2024-06-02: qty 30, 30d supply, fill #8
  Filled 2024-06-23: qty 30, 30d supply, fill #9
  Filled 2024-07-24 – 2024-07-27 (×2): qty 30, 30d supply, fill #10
  Filled 2024-08-21: qty 30, 30d supply, fill #11

## 2023-09-24 ENCOUNTER — Other Ambulatory Visit: Payer: Self-pay

## 2023-09-24 ENCOUNTER — Other Ambulatory Visit (HOSPITAL_COMMUNITY): Payer: Self-pay | Admitting: Pharmacy Technician

## 2023-09-24 ENCOUNTER — Encounter (HOSPITAL_COMMUNITY): Payer: Self-pay

## 2023-09-24 ENCOUNTER — Other Ambulatory Visit (HOSPITAL_COMMUNITY): Payer: Self-pay

## 2023-09-24 NOTE — Progress Notes (Signed)
 Specialty Pharmacy Initial Fill Coordination Note  Cody Small is a 78 y.o. male contacted today regarding initial fill of specialty medication(s) Tafamidis  (Vyndamax )   Patient requested Delivery   Delivery date: 10/10/23   Verified address: [address], [place], [place], [address]   Medication will be filled on Thursday, 10/10/23.   Patient is aware of $0 copayment.

## 2023-09-24 NOTE — Progress Notes (Signed)
 Specialty Pharmacy Initiation Note   Cody Small is a 78 y.o. male who will be followed by the specialty pharmacy service for RxSp Cardiology    Review of administration, indication, effectiveness, safety, potential side effects, storage/disposable, and missed dose instructions occurred today for patient's specialty medication(s) Tafamidis  (Vyndamax )     Patient/Caregiver did not have any additional questions or concerns.   Patient's therapy is appropriate to: Initiate (Stopping Vyndaqel  and starting Vyndamax  due to decreased pill burden)    Goals Addressed             This Visit's Progress    Stabilization of disease       Patient is initiating therapy. Patient will maintain adherence         Astra Gregg CHRISTELLA Redman Specialty Pharmacist

## 2023-10-11 ENCOUNTER — Other Ambulatory Visit: Payer: Self-pay

## 2023-10-14 ENCOUNTER — Other Ambulatory Visit (HOSPITAL_COMMUNITY): Payer: Self-pay | Admitting: Cardiology

## 2023-10-22 ENCOUNTER — Other Ambulatory Visit (HOSPITAL_COMMUNITY): Payer: Self-pay

## 2023-10-28 DIAGNOSIS — H25013 Cortical age-related cataract, bilateral: Secondary | ICD-10-CM | POA: Diagnosis not present

## 2023-10-28 DIAGNOSIS — Z7984 Long term (current) use of oral hypoglycemic drugs: Secondary | ICD-10-CM | POA: Diagnosis not present

## 2023-10-28 DIAGNOSIS — H2513 Age-related nuclear cataract, bilateral: Secondary | ICD-10-CM | POA: Diagnosis not present

## 2023-10-28 DIAGNOSIS — H524 Presbyopia: Secondary | ICD-10-CM | POA: Diagnosis not present

## 2023-10-28 DIAGNOSIS — D22112 Melanocytic nevi of right lower eyelid, including canthus: Secondary | ICD-10-CM | POA: Diagnosis not present

## 2023-10-28 DIAGNOSIS — H52203 Unspecified astigmatism, bilateral: Secondary | ICD-10-CM | POA: Diagnosis not present

## 2023-10-28 DIAGNOSIS — E119 Type 2 diabetes mellitus without complications: Secondary | ICD-10-CM | POA: Diagnosis not present

## 2023-11-06 ENCOUNTER — Other Ambulatory Visit: Payer: Self-pay

## 2023-11-06 ENCOUNTER — Other Ambulatory Visit (HOSPITAL_COMMUNITY): Payer: Self-pay

## 2023-11-06 ENCOUNTER — Other Ambulatory Visit: Payer: Self-pay | Admitting: Pharmacy Technician

## 2023-11-06 NOTE — Progress Notes (Signed)
 Specialty Pharmacy Refill Coordination Note  Cody Small is a 78 y.o. male contacted today regarding refills of specialty medication(s) Tafamidis [name])   Patient requested Delivery   Delivery date: 11/08/23   Verified address: [address] [place]   Medication will be filled on 11/07/23.

## 2023-11-07 ENCOUNTER — Other Ambulatory Visit: Payer: Self-pay

## 2023-11-19 ENCOUNTER — Other Ambulatory Visit (HOSPITAL_COMMUNITY): Payer: Self-pay | Admitting: Cardiology

## 2023-11-22 ENCOUNTER — Telehealth (HOSPITAL_COMMUNITY): Payer: Self-pay | Admitting: Cardiology

## 2023-12-02 ENCOUNTER — Other Ambulatory Visit: Payer: Self-pay

## 2023-12-02 NOTE — Progress Notes (Signed)
 Specialty Pharmacy Refill Coordination Note  Cody Small is a 78 y.o. male contacted today regarding refills of specialty medication(s) Tafamidis (Vyndamax)   Patient requested Delivery   Delivery date: 12/09/23   Verified address: [address] [place]   Medication will be filled on 12/06/23.

## 2023-12-04 ENCOUNTER — Encounter (HOSPITAL_COMMUNITY): Admitting: Cardiology

## 2023-12-04 ENCOUNTER — Other Ambulatory Visit (HOSPITAL_COMMUNITY): Payer: Self-pay

## 2023-12-05 ENCOUNTER — Ambulatory Visit (HOSPITAL_COMMUNITY)
Admission: RE | Admit: 2023-12-05 | Discharge: 2023-12-05 | Disposition: A | Discharge status: Home / Self Care | Source: Ambulatory Visit | Attending: Adult Health | Admitting: Adult Health

## 2023-12-05 ENCOUNTER — Encounter (HOSPITAL_COMMUNITY): Payer: Self-pay

## 2023-12-05 VITALS — BP 120/62 | HR 59 | Ht 67.0 in | Wt 156.2 lb

## 2023-12-05 DIAGNOSIS — Z7984 Long term (current) use of oral hypoglycemic drugs: Secondary | ICD-10-CM | POA: Diagnosis not present

## 2023-12-05 DIAGNOSIS — Z7982 Long term (current) use of aspirin: Secondary | ICD-10-CM | POA: Insufficient documentation

## 2023-12-05 DIAGNOSIS — I252 Old myocardial infarction: Secondary | ICD-10-CM | POA: Insufficient documentation

## 2023-12-05 DIAGNOSIS — Z955 Presence of coronary angioplasty implant and graft: Secondary | ICD-10-CM | POA: Insufficient documentation

## 2023-12-05 DIAGNOSIS — E1122 Type 2 diabetes mellitus with diabetic chronic kidney disease: Secondary | ICD-10-CM | POA: Diagnosis not present

## 2023-12-05 DIAGNOSIS — E785 Hyperlipidemia, unspecified: Secondary | ICD-10-CM | POA: Diagnosis not present

## 2023-12-05 DIAGNOSIS — I130 Hypertensive heart and chronic kidney disease with heart failure and stage 1 through stage 4 chronic kidney disease, or unspecified chronic kidney disease: Secondary | ICD-10-CM | POA: Diagnosis not present

## 2023-12-05 DIAGNOSIS — I5022 Chronic systolic (congestive) heart failure: Secondary | ICD-10-CM | POA: Diagnosis present

## 2023-12-05 DIAGNOSIS — Z79899 Other long term (current) drug therapy: Secondary | ICD-10-CM | POA: Insufficient documentation

## 2023-12-05 DIAGNOSIS — E854 Organ-limited amyloidosis: Secondary | ICD-10-CM | POA: Diagnosis not present

## 2023-12-05 DIAGNOSIS — N1832 Chronic kidney disease, stage 3b: Secondary | ICD-10-CM | POA: Diagnosis not present

## 2023-12-05 DIAGNOSIS — I2510 Atherosclerotic heart disease of native coronary artery without angina pectoris: Secondary | ICD-10-CM | POA: Insufficient documentation

## 2023-12-05 DIAGNOSIS — Z87891 Personal history of nicotine dependence: Secondary | ICD-10-CM | POA: Diagnosis not present

## 2023-12-05 DIAGNOSIS — E1142 Type 2 diabetes mellitus with diabetic polyneuropathy: Secondary | ICD-10-CM | POA: Diagnosis not present

## 2023-12-05 DIAGNOSIS — N1831 Chronic kidney disease, stage 3a: Secondary | ICD-10-CM | POA: Diagnosis not present

## 2023-12-05 DIAGNOSIS — I43 Cardiomyopathy in diseases classified elsewhere: Secondary | ICD-10-CM

## 2023-12-05 LAB — BASIC METABOLIC PANEL WITH GFR
Anion gap: 10 (ref 5–15)
BUN: 19 mg/dL (ref 8–23)
CO2: 20 mmol/L — ABNORMAL LOW (ref 22–32)
Calcium: 9.2 mg/dL (ref 8.9–10.3)
Chloride: 109 mmol/L (ref 98–111)
Creatinine, Ser: 1.64 mg/dL — ABNORMAL HIGH (ref 0.61–1.24)
GFR, Estimated: 43 mL/min — ABNORMAL LOW (ref >60)
Glucose, Bld: 113 mg/dL — ABNORMAL HIGH (ref 70–99)
Potassium: 3.8 mmol/L (ref 3.5–5.1)
Sodium: 139 mmol/L (ref 135–145)

## 2023-12-05 NOTE — Progress Notes (Signed)
 ADVANCED HEART FAILURE CLINIC NOTE  Referring Physician: Genevive Bi, PA*  Primary Care: Genevive Bi, PA-C  Chief Complaint: Heart Failure  HPI: Cody Small is a 78 y.o. male with CAD (NSTEMI in 9/21 status post PCI to the circumflex), HFrEF, type 2 diabetes, hyperlipidemia, hypertension, asthma and CKD 3A.  Mr. Oelkers reports cardiac history dating back to at least 2021 when he had an NSTEMI leading to an LVEF of 45%. He had an echocardiogram in September 2022 with EF of 40 to 45% and severe inferolateral hypokinesis. He was admitted to [facility] with severe shortness of breath; BNP>3000 in December 2023. He underwent LHC/RHC with nonobstructive CAD. RHC at that time with cardiac index 2.5L/min/m2. He was diagnosed with COVID19 during his hospitalization on July 29, 2022. He had a repeat echo on 07/27/22 where LVEF dropped further to 20-25%. Since that time he was seen in [facility] clinic and statrted on GDMT.   Interval Hx: Today he returns for HF follow up.Overall feeling fine. Denies PND/Orthopnea. Gets a little short of breath after walking up 2 flights of stair. Has a little numbness in R hand but no different than previous. Appetite ok. No fever or chills. Weight at home 152 pounds. Taking all medications. Lives with his wife.    Activity level/exercise tolerance:  NYHAII Orthopnea:  Sleeps on 2 pillows Paroxysmal noctural dyspnea:  no Chest pain/pressure:  no Orthostatic lightheadedness:  no Palpitations:  no Lower extremity edema:  no Presyncope/syncope:  no Cough:  no  Past Medical History:  Diagnosis Date   Asthma    DM2 (diabetes mellitus, type 2) (HCC)    HFrEF (heart failure with reduced ejection fraction) (HCC)    HLD (hyperlipidemia)    HTN (hypertension)    MI (myocardial infarction) (HCC)     Current Outpatient Medications  Medication Sig Dispense Refill   aspirin EC 81 MG tablet Take 81 mg by mouth daily.     atorvastatin (LIPITOR) 80  MG tablet TAKE 1 TABLET BY MOUTH EVERY EVENING 90 tablet 0   budesonide-formoterol (SYMBICORT) 160-4.5 MCG/ACT inhaler Inhale 2 puffs into the lungs 2 (two) times daily.     hydrOXYzine (ATARAX) 10 MG tablet Take 10 mg by mouth 3 (three) times daily as needed for itching.     JARDIANCE 10 MG TABS tablet TAKE 1 TABLET BY MOUTH EVERY DAY 90 tablet 3   metoprolol succinate (TOPROL-XL) 25 MG 24 hr tablet Take 0.5 tablets (12.5 mg total) by mouth daily. 45 tablet 3   sacubitril-valsartan (ENTRESTO) 24-26 MG Take 1 tablet by mouth 2 (two) times daily. 180 tablet 3   spironolactone (ALDACTONE) 25 MG tablet Take 0.5 tablets (12.5 mg total) by mouth daily. 45 tablet 3   furosemide (LASIX) 40 MG tablet Take 1 tablet (40 mg total) by mouth as needed. 30 tablet 11   Tafamidis (VYNDAMAX) 61 MG CAPS Take 1 capsule (61 mg total) by mouth daily. 30 capsule 11   No current facility-administered medications for this encounter.    No Known Allergies    Social History   Socioeconomic History   Marital status: Married    Spouse name: Olegario Messier   Number of children: 3   Years of education: Not on file   Highest education level: High school graduate  Occupational History   Occupation: Retired  Tobacco Use   Smoking status: Former    Current packs/day: 1.00    Types: Cigarettes   Smokeless tobacco: Never  Tobacco comments:    Quit smoking and drinking around 1977   Vaping Use   Vaping status: Never Used  Substance and Sexual Activity   Alcohol use: No   Drug use: Never   Sexual activity: Not Currently  Other Topics Concern   Not on file  Social History Narrative   Not on file   Social Drivers of Health   Financial Resource Strain: Low Risk  (07/27/2022)   Overall Financial Resource Strain (CARDIA)    Difficulty of Paying Living Expenses: Not hard at all  Food Insecurity: Low Risk  (08/30/2022)   [facility]   Hunger Vital Sign    Worried About Running Out of Food  in the Last Year: Never true    Within the past 12 months, the food you bought just didn't last and you didn't have money to get more: Not on file  Transportation Needs: No Transportation Needs (08/30/2022)   [facility]   Transportation    In the past 12 months, has lack of reliable transportation kept you from medical appointments, meetings, work or from getting things needed for daily living? : No  Physical Activity: Unknown (06/14/2020)   [facility] visits prior to 10/20/2022., [facility] visits prior to 10/20/2022.   Exercise Vital Sign    Days of Exercise per Week: 3 days    Minutes of Exercise per Session: Not on file  Stress: No Stress Concern Present (06/14/2020)   [facility] visits prior to 10/20/2022., [facility] visits prior to 10/20/2022.   [employer] of Occupational Health - Occupational Stress Questionnaire    Feeling of Stress : Not at all  Social Connections: Unknown (06/14/2020)   [facility] prior to 10/20/2022., [facility] visits prior to 10/20/2022.   Social Advertising account executive [NHANES]    Frequency of Communication with Friends and Family: More than three times a week    Frequency of Social Gatherings with Friends and Family: More than three times a week    Attends Religious Services: Not on file    Active Member of Clubs or Organizations: No    Attends Banker Meetings: Never    Marital Status: Married  Catering manager Violence: Not At Risk (07/27/2022)   Humiliation, Afraid, Rape, and Kick questionnaire    Fear of Current or Ex-Partner: No    Emotionally Abused: No    Physically Abused: No    Sexually Abused: No      Family History  Problem Relation Age of Onset   Kidney failure Mother    Cancer Sister    Pancreatic cancer  Brother     PHYSICAL EXAM: Vitals:   12/05/23 1013  BP: 120/62  Pulse: (!) 59  SpO2: 98%   Wt Readings from Last 3 Encounters:  12/05/23 70.9 kg (156 lb 3.2 oz)  07/02/23 71.2 kg (157 lb)  06/24/23 68.9 kg (152 lb)    GENERAL: NAD Lungs- Clear  CARDIAC:  JVP: 5-6 cm          Normal rate with regular rhythm. No murmur.  No  edema.  ABDOMEN: Soft, non-tender, non-distended.  EXTREMITIES: Warm and well perfused.  NEUROLOGIC: No obvious FND    DATA REVIEW  ECG:08/21/22: NSR  ECHO: 07/27/22: LVEF 25%-30%; Grade III DD. Severe hypokinesis of the  inferolateral wall. Moderately reduced RV function.   CATH: 07/30/22:    2nd Diag lesion is 50% stenosed.   Prox Cx to Mid Cx lesion is 10% stenosed.   Mild coronary artery disease with ostial 50% second diagonal stenosis in a large LAD system; patent left circumflex stent with minimal mid irregularity of 10%; and normal RCA.  No significant right heart pressure elevation with mild PA systolic elevation at 36 mm without without evidence for elevated pulmonary artery mean pressure. LVEDP 5 mmHg.   CMR: 12/20/22:  1. Normal LV size with wall motion abnormalities as noted above. LV EF 37%.  2.  Normal RV size with mild hypokinesis, EF 40%.  3. Delayed enhancement pattern suggests prior MI in the circumflex territory.  4. Extracellular volume percentage calculates surprisingly high at 41% though absolute T1 value is not markedly high at 1066. There are no delayed enhancement findings that suggest cardiac amyloidosis and wall thickness is normal. However, witih the high ECV value, it would be worthwhile considering cardiac amyloidosis workup to rule this out.  ASSESSMENT & PLAN:  Heart failure with reduced EF Etiology of IO:NGEXBMWU cardiomyopathy; NSTEMI in 2019. CMR with LVEF 37% however high ECV concerning for amyloid; PYP scan positive for ATTR. Will obtain genetic screening today.  NYHA class / AHA Stage:II Volume status &  Diuretics: Appear euvolemic. Continue Lasix 40mg  PO prn Vasodilators: Continue Entresto 24/26mg  BID.  Beta-Blocker: Continue lower dose of Toprl 12.5 mg daily with bradycardia  MRA: Continue  spironolactone 12.5 mg daily.  Cardiometabolic:jardiance 10mg  Devices therapies & Valvulopathies: Not required; LVEF 37% by CMR.  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  - Continue Vyndaqel 80mg  daily. He is getting patient assistance.  - discussed screening for family members; will reach out to company for this.  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 He wants to hold off additional meds. We discussed again today .   3. CAD - PCI to the Lcx in 2019 - Lipitor 80mg  and ASA 81mg  daily  - LHC in 12/23 with patent stent. - No chest pain.  -Continue high intensity statin.   4. HTN - Stable.   5. CKD - Jardiance 10mg  daily -Check BMET   Follow up with Dr Bruce Caper 3 months    Jadee Golebiewski NP-C  12:09 PM

## 2023-12-05 NOTE — Patient Instructions (Signed)
 Medication Changes:  No Changes In Medications at this time.   Lab Work:  Labs done today, your results will be available in MyChart, we will contact you for abnormal readings.  Follow-Up in: 4 MONTHS PLEASE CALL OUR OFFICE AROUND JUNE TO GET SCHEDULED FOR YOUR APPOINTMENT. PHONE NUMBER IS [phone] OPTION 2   At the [facility], you and your health needs are our priority. We have a designated team specialized in the treatment of Heart Failure. This Care Team includes your primary Heart Failure Specialized Cardiologist (physician), Advanced Practice Providers (APPs- Physician Assistants and Nurse Practitioners), and Pharmacist who all work together to provide you with the care you need, when you need it.   You may see any of the following providers on your designated Care Team at your next follow up:  Dr. Jules Oar Dr. Peder Bourdon Dr. Alwin Baars Dr. Judyth Nunnery Nieves Bars, NP Ruddy Corral, [place] [facility] [place], [place] Dennise Fitz, NP [place] Lee, NP Luster Salters, PharmD   Please be sure to bring in all your medications bottles to every appointment.   Need to Contact Us :  If you have any questions or concerns before your next appointment please send us  a message through [place] or call our office at [phone].    TO LEAVE A MESSAGE FOR THE NURSE SELECT OPTION 2, PLEASE LEAVE A MESSAGE INCLUDING: YOUR NAME DATE OF BIRTH CALL BACK NUMBER REASON FOR CALL**this is important as we prioritize the call backs  YOU WILL RECEIVE A CALL BACK THE SAME DAY AS LONG AS YOU CALL BEFORE 4:00 PM

## 2023-12-06 ENCOUNTER — Other Ambulatory Visit: Payer: Self-pay

## 2023-12-12 ENCOUNTER — Other Ambulatory Visit (HOSPITAL_COMMUNITY): Payer: Self-pay | Admitting: Cardiology

## 2023-12-30 ENCOUNTER — Other Ambulatory Visit: Payer: Self-pay

## 2024-01-01 ENCOUNTER — Other Ambulatory Visit: Payer: Self-pay | Admitting: Pharmacy Technician

## 2024-01-01 ENCOUNTER — Other Ambulatory Visit: Payer: Self-pay

## 2024-01-01 NOTE — Progress Notes (Signed)
 Specialty Pharmacy Refill Coordination Note  Cody Small is a 78 y.o. male contacted today regarding refills of specialty medication(s) Tafamidis  (Vyndamax )   Patient requested Delivery   Delivery date: 01/09/24   Verified address: [address]  [place]   Medication will be filled on 01/08/24.

## 2024-01-08 ENCOUNTER — Other Ambulatory Visit: Payer: Self-pay

## 2024-01-31 ENCOUNTER — Other Ambulatory Visit: Payer: Self-pay

## 2024-02-03 ENCOUNTER — Other Ambulatory Visit: Payer: Self-pay

## 2024-02-04 ENCOUNTER — Other Ambulatory Visit (HOSPITAL_COMMUNITY): Payer: Self-pay | Admitting: Cardiology

## 2024-02-04 ENCOUNTER — Other Ambulatory Visit (HOSPITAL_COMMUNITY): Payer: Self-pay

## 2024-02-04 ENCOUNTER — Other Ambulatory Visit: Payer: Self-pay | Admitting: Pharmacy Technician

## 2024-02-04 ENCOUNTER — Other Ambulatory Visit: Payer: Self-pay

## 2024-02-04 NOTE — Progress Notes (Signed)
 Specialty Pharmacy Refill Coordination Note  Cody Small is a 78 y.o. male contacted today regarding refills of specialty medication(s) Tafamidis  (Vyndamax )   Patient requested Delivery   Delivery date: 02/07/24   Verified address: [address]  [place]   Medication will be filled on 02/06/24.

## 2024-02-05 ENCOUNTER — Other Ambulatory Visit: Payer: Self-pay

## 2024-02-07 ENCOUNTER — Other Ambulatory Visit: Payer: Self-pay

## 2024-02-07 ENCOUNTER — Other Ambulatory Visit (HOSPITAL_COMMUNITY): Payer: Self-pay

## 2024-02-07 MED ORDER — SPIRONOLACTONE 25 MG PO TABS
12.5000 mg | ORAL_TABLET | Freq: Every day | ORAL | 3 refills | Status: AC
  Filled 2024-02-07: qty 45, 90d supply, fill #0
  Filled 2024-05-18: qty 45, 90d supply, fill #1
  Filled 2024-08-04: qty 45, 90d supply, fill #2

## 2024-02-17 ENCOUNTER — Other Ambulatory Visit (HOSPITAL_COMMUNITY): Payer: Self-pay

## 2024-02-19 ENCOUNTER — Other Ambulatory Visit (HOSPITAL_COMMUNITY): Payer: Self-pay | Admitting: Cardiology

## 2024-03-02 ENCOUNTER — Other Ambulatory Visit: Payer: Self-pay

## 2024-03-02 NOTE — Progress Notes (Signed)
 Specialty Pharmacy Refill Coordination Note  Cody Small is a 78 y.o. male contacted today regarding refills of specialty medication(s) Tafamidis  (Vyndamax )   Patient requested Delivery   Delivery date: 03/06/24   Verified address: [address]  [place]   Medication will be filled on 07.17.25.

## 2024-03-04 ENCOUNTER — Other Ambulatory Visit: Payer: Self-pay

## 2024-03-16 ENCOUNTER — Other Ambulatory Visit: Payer: Self-pay

## 2024-03-27 ENCOUNTER — Other Ambulatory Visit: Payer: Self-pay

## 2024-03-27 NOTE — Progress Notes (Signed)
 Specialty Pharmacy Refill Coordination Note  Cody Small is a 78 y.o. male contacted today regarding refills of specialty medication(s) Tafamidis  (Vyndamax )   Patient requested Delivery   Delivery date: 04/07/24   Verified address: [address]  [place]   Medication will be filled Small 08.18.25.

## 2024-04-06 ENCOUNTER — Other Ambulatory Visit: Payer: Self-pay

## 2024-04-13 ENCOUNTER — Other Ambulatory Visit (HOSPITAL_COMMUNITY): Payer: Self-pay

## 2024-04-21 ENCOUNTER — Other Ambulatory Visit (HOSPITAL_COMMUNITY): Payer: Self-pay

## 2024-04-22 ENCOUNTER — Other Ambulatory Visit (HOSPITAL_COMMUNITY): Payer: Self-pay | Admitting: Cardiology

## 2024-04-22 MED ORDER — ATORVASTATIN CALCIUM 80 MG PO TABS: 80.0000 mg | ORAL_TABLET | Freq: Every evening | ORAL | 11 refills | Status: AC

## 2024-05-01 ENCOUNTER — Other Ambulatory Visit: Payer: Self-pay

## 2024-05-01 NOTE — Progress Notes (Signed)
 Specialty Pharmacy Refill Coordination Note  Cody Small is a 78 y.o. male contacted today regarding refills of specialty medication(s) Tafamidis  (Vyndamax )   Patient requested Delivery   Delivery date: 05/07/24   Verified address: [address]  [name]   Medication will be filled on 09.17.25.

## 2024-05-06 ENCOUNTER — Other Ambulatory Visit: Payer: Self-pay

## 2024-05-18 ENCOUNTER — Other Ambulatory Visit (HOSPITAL_COMMUNITY): Payer: Self-pay

## 2024-05-28 ENCOUNTER — Other Ambulatory Visit: Payer: Self-pay

## 2024-06-02 ENCOUNTER — Other Ambulatory Visit: Payer: Self-pay

## 2024-06-02 NOTE — Progress Notes (Signed)
 Specialty Pharmacy Refill Coordination Note  Cody Small is a 77 y.o. male contacted today regarding refills of specialty medication(s) Tafamidis  (Vyndamax )   Patient requested Delivery   Delivery date: 06/05/24   Verified address: [address]  [place]   Medication will be filled on 06/04/24.

## 2024-06-04 ENCOUNTER — Other Ambulatory Visit: Payer: Self-pay

## 2024-06-23 ENCOUNTER — Other Ambulatory Visit: Payer: Self-pay

## 2024-06-23 NOTE — Progress Notes (Signed)
 Specialty Pharmacy Refill Coordination Note  Cody Small is a 78 y.o. male contacted today regarding refills of specialty medication(s) Tafamidis  (Vyndamax )   Patient requested Delivery   Delivery date: 07/03/24   Verified address: [address]  [place]   Medication will be filled on: 07/02/24

## 2024-06-23 NOTE — Progress Notes (Signed)
 Specialty Pharmacy Ongoing Clinical Assessment Note  Cody Small is a 78 y.o. male who is being followed by the specialty pharmacy service for RxSp Cardiology   Patient's specialty medication(s) reviewed today: Tafamidis  (Vyndamax )   Missed doses in the last 4 weeks: 0   Patient/Caregiver did not have any additional questions or concerns.   Therapeutic benefit summary: Patient is achieving benefit   Adverse events/side effects summary: No adverse events/side effects   Patient's therapy is appropriate to: Continue    Goals Addressed             This Visit's Progress    Maintain optimal adherence to therapy   On track    Patient is on track. Patient will maintain adherence       Slow Disease Progression   On track    Patient is on track. Patient will maintain adherence      Stabilization of disease   On track    Patient is on track. Patient will maintain adherence          Follow up: 12 months  Cody Small M Rosemary Pentecost Specialty Pharmacist

## 2024-07-02 ENCOUNTER — Other Ambulatory Visit: Payer: Self-pay

## 2024-07-13 ENCOUNTER — Encounter (HOSPITAL_COMMUNITY): Payer: Self-pay | Admitting: Cardiology

## 2024-07-13 ENCOUNTER — Ambulatory Visit (HOSPITAL_COMMUNITY)
Admission: RE | Admit: 2024-07-13 | Discharge: 2024-07-13 | Disposition: A | Discharge status: Home / Self Care | Source: Ambulatory Visit | Attending: Cardiology | Admitting: Cardiology

## 2024-07-13 VITALS — BP 110/60 | HR 60 | Wt 156.4 lb

## 2024-07-13 DIAGNOSIS — E8582 Wild-type transthyretin-related (ATTR) amyloidosis: Secondary | ICD-10-CM | POA: Insufficient documentation

## 2024-07-13 DIAGNOSIS — I43 Cardiomyopathy in diseases classified elsewhere: Secondary | ICD-10-CM

## 2024-07-13 DIAGNOSIS — I255 Ischemic cardiomyopathy: Secondary | ICD-10-CM | POA: Insufficient documentation

## 2024-07-13 DIAGNOSIS — I252 Old myocardial infarction: Secondary | ICD-10-CM | POA: Diagnosis not present

## 2024-07-13 DIAGNOSIS — I2510 Atherosclerotic heart disease of native coronary artery without angina pectoris: Secondary | ICD-10-CM | POA: Insufficient documentation

## 2024-07-13 DIAGNOSIS — I5022 Chronic systolic (congestive) heart failure: Secondary | ICD-10-CM | POA: Insufficient documentation

## 2024-07-13 DIAGNOSIS — I428 Other cardiomyopathies: Secondary | ICD-10-CM | POA: Insufficient documentation

## 2024-07-13 DIAGNOSIS — Z79899 Other long term (current) drug therapy: Secondary | ICD-10-CM | POA: Insufficient documentation

## 2024-07-13 DIAGNOSIS — N1832 Chronic kidney disease, stage 3b: Secondary | ICD-10-CM | POA: Diagnosis not present

## 2024-07-13 DIAGNOSIS — I129 Hypertensive chronic kidney disease with stage 1 through stage 4 chronic kidney disease, or unspecified chronic kidney disease: Secondary | ICD-10-CM | POA: Insufficient documentation

## 2024-07-13 DIAGNOSIS — E854 Organ-limited amyloidosis: Secondary | ICD-10-CM

## 2024-07-13 NOTE — Patient Instructions (Signed)
 There has been no changes to your medications.  Your physician has requested that you have an echocardiogram. Echocardiography is a painless test that uses sound waves to create images of your heart. It provides your doctor with information about the size and shape of your heart and how well your heart's chambers and valves are working. This procedure takes approximately one hour. There are no restrictions for this procedure. Please do NOT wear cologne, perfume, aftershave, or lotions (deodorant is allowed). Please arrive 15 minutes prior to your appointment time.  Please note: We ask at that you not bring children with you during ultrasound (echo/ vascular) testing. Due to room size and safety concerns, children are not allowed in the ultrasound rooms during exams. Our front office staff cannot provide observation of children in our lobby area while testing is being conducted. An adult accompanying a patient to their appointment will only be allowed in the ultrasound room at the discretion of the ultrasound technician under special circumstances. We apologize for any inconvenience.  Your physician recommends that you schedule a follow-up appointment in: 6 months ( May 2026) ** PLEASE CALL THE OFFICE IN MARCH 2026 TO ARRANGE YOUR FOLLOW UP APPOINTMENT.**  If you have any questions or concerns before your next appointment please send us  a message through [facility] or call our office at [phone].    TO LEAVE A MESSAGE FOR THE NURSE SELECT OPTION 2, PLEASE LEAVE A MESSAGE INCLUDING: YOUR NAME DATE OF BIRTH CALL BACK NUMBER REASON FOR CALL**this is important as we prioritize the call backs  YOU WILL RECEIVE A CALL BACK THE SAME DAY AS LONG AS YOU CALL BEFORE 4:00 PM  At the [facility], you and your health needs are our priority. As part of our continuing mission to provide you with exceptional heart care, we have created designated Provider Care Teams. These Care Teams include  your primary Cardiologist (physician) and Advanced Practice Providers (APPs- Physician Assistants and Nurse Practitioners) who all work together to provide you with the care you need, when you need it.   You may see any of the following providers on your designated Care Team at your next follow up: Dr Toribio Fuel Dr Ezra Shuck Dr. Morene Brownie Greig Mosses, NP Caffie Shed, [place] [facility] [place], [place] Beckey Coe, NP Jordan Lee, NP Ellouise Class, NP Tinnie Redman, PharmD Jaun Bash, PharmD   Please be sure to bring in all your medications bottles to every appointment.    Thank you for choosing [facility]

## 2024-07-14 NOTE — Progress Notes (Signed)
   ADVANCED HEART FAILURE FOLLOW UP CLINIC NOTE  Referring Physician: Ashley Eleanor Jacob, PA*  Primary Care: Ashley Eleanor Jacob, PA-C Primary Cardiologist:  HPI: Cody Small is a 78 y.o. male who presents for follow up of chronic systolic heart faliure.      Mr. Roker reports cardiac history dating back to at least 2021 when he had an NSTEMI leading to an LVEF of 45%. He had an echocardiogram in September 2022 with EF of 40 to 45% and severe inferolateral hypokinesis. He was admitted to [facility] with severe shortness of breath; BNP>3000 in December 2023. He underwent LHC/RHC with nonobstructive CAD. RHC at that time with cardiac index 2.5L/min/m2. He was diagnosed with COVID19 during his hospitalization on July 29, 2022. He had a repeat echo on 07/27/22 where LVEF dropped further to 20-25%. Since that time he was seen in [facility] clinic and statrted on GDMT.      SUBJECTIVE:  Overall patient reports that he is feeling well.  He has been taking his tafamidis  without issue.  Heart rate of 60 today, denies any low heart rates, lightheadedness, dizziness, presyncopal events at home.  Denies any worsening volume or fluid symptoms.  PMH, current medications, allergies, social history, and family history reviewed in epic.  PHYSICAL EXAM: Vitals:   07/13/24 0959  BP: 110/60  Pulse: 60  SpO2: 100%   GENERAL: Well nourished and in no apparent distress at rest.  PULM:  Normal work of breathing, clear to auscultation bilaterally. Respirations are unlabored.  CARDIAC:  JVP: Flat         Bradycardic rate with regular rhythm. No murmurs, rubs or gallops.  No edema. Warm and well perfused extremities. ABDOMEN: Soft, non-tender, non-distended. NEUROLOGIC: Patient is oriented x3 with no focal or lateralizing neurologic deficits.    DATA REVIEW  ECG: 04/2023: Sinus bradycardia  ECHO: 07/2022: LVEF 25 to 30%, grade 3 diastolic dysfunction, moderately reduced RV  function  CATH: 07/2022: Mild nonobstructive disease, low filling pressures, assumed Fick cardiac output/index 4.4/2.5  CMR: Normal LV size with LVEF 37%, mildly reduced RV function, potential prior MI in the circumflex territory, ECV 41%  ASSESSMENT & PLAN:  Chronic systolic heart failure: Mixed NICM/ICM, prior NSTEMI in 2019.  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  - Primarily due to diagnosis of cardiac amyloid, repeat echo given last LV function done in 2024 - Continue Entresto  24/26 mg twice daily - Continue spironolactone  12.5 mg daily - Continue tafamidis  61 mg daily - Continue Jardiance  10 mg daily - Continue metoprolol  12.5 mg daily, low threshold to stop if heart rate worsens - Euvolemic, no need for diuretics  CAD: Prior left circumflex stents, infarct seen on CMR.  - continue atorvastatin  80 mg daily, aspirin  81 mg daily - No anginal symptoms  HTN: - Well-controlled, management as above  CKD: Stage IIIb - Last creatinine 1.68, eGFR 42 on 04/2024.  SGLT2 as above  Follow up in 6 months with echo  Morene Brownie, MD Advanced Heart Failure Mechanical Circulatory Support 07/14/24

## 2024-07-24 ENCOUNTER — Other Ambulatory Visit: Payer: Self-pay

## 2024-07-27 ENCOUNTER — Other Ambulatory Visit: Payer: Self-pay

## 2024-07-29 ENCOUNTER — Other Ambulatory Visit: Payer: Self-pay | Admitting: Pharmacy Technician

## 2024-07-29 ENCOUNTER — Other Ambulatory Visit: Payer: Self-pay

## 2024-07-29 NOTE — Progress Notes (Signed)
 Specialty Pharmacy Refill Coordination Note  Cody Small is a 78 y.o. male contacted today regarding refills of specialty medication(s) Tafamidis  (Vyndamax )   Patient requested Delivery   Delivery date: 07/31/24   Verified address: [address]  [place]    Medication will be filled on: 07/30/24

## 2024-07-30 ENCOUNTER — Other Ambulatory Visit: Payer: Self-pay

## 2024-08-04 ENCOUNTER — Other Ambulatory Visit (HOSPITAL_COMMUNITY): Payer: Self-pay

## 2024-08-19 ENCOUNTER — Other Ambulatory Visit (HOSPITAL_COMMUNITY): Payer: Self-pay

## 2024-08-21 ENCOUNTER — Telehealth (HOSPITAL_COMMUNITY): Payer: Self-pay

## 2024-08-21 ENCOUNTER — Other Ambulatory Visit (HOSPITAL_COMMUNITY): Payer: Self-pay

## 2024-08-21 NOTE — Telephone Encounter (Signed)
 Advanced Heart Failure Patient Advocate Encounter  Review of patient chart regarding concerns with copays for 2026. Spoke to patient by phone, confirmed Jardiance  and Sacubitril -Valsartan  both show $0 copay for 90 days. Vyndamax  is currently being filled with a copay of $12.65 for 30 days.  Medication assistance not needed at this time.  Rachel DEL, CPhT Rx Patient Advocate Phone: [phone]

## 2024-08-25 ENCOUNTER — Other Ambulatory Visit: Payer: Self-pay

## 2024-08-25 NOTE — Progress Notes (Signed)
 Specialty Pharmacy Refill Coordination Note  Cody Small is a 79 y.o. male contacted today regarding refills of specialty medication(s) Tafamidis  (Vyndamax )   Patient requested Delivery   Delivery date: 09/02/24   Verified address: [address]  [place]   Medication will be filled on: 09/01/24

## 2024-09-01 ENCOUNTER — Other Ambulatory Visit: Payer: Self-pay

## 2024-09-24 ENCOUNTER — Other Ambulatory Visit: Payer: Self-pay

## 2024-09-24 ENCOUNTER — Other Ambulatory Visit (HOSPITAL_COMMUNITY): Payer: Self-pay

## 2024-09-24 ENCOUNTER — Other Ambulatory Visit (HOSPITAL_COMMUNITY): Payer: Self-pay | Admitting: Cardiology

## 2024-09-24 MED ORDER — VYNDAMAX 61 MG PO CAPS
1.0000 | ORAL_CAPSULE | Freq: Every day | ORAL | 11 refills | Status: AC
  Filled 2024-09-24 – 2024-09-25 (×2): qty 30, 30d supply, fill #0

## 2024-09-25 ENCOUNTER — Other Ambulatory Visit (HOSPITAL_COMMUNITY): Payer: Self-pay

## 2024-09-25 ENCOUNTER — Other Ambulatory Visit: Payer: Self-pay
